# Patient Record
Sex: Female | Born: 1950 | Race: Black or African American | Hispanic: No | Marital: Single | Smoking: Former smoker
Health system: Southern US, Community
[De-identification: ages and names within clinical notes are randomized; demographics above are authoritative.]

## PROBLEM LIST (undated history)

## (undated) DIAGNOSIS — J449 Chronic obstructive pulmonary disease, unspecified: Secondary | ICD-10-CM

## (undated) DIAGNOSIS — M109 Gout, unspecified: Secondary | ICD-10-CM

## (undated) DIAGNOSIS — E789 Disorder of lipoprotein metabolism, unspecified: Secondary | ICD-10-CM

## (undated) DIAGNOSIS — M329 Systemic lupus erythematosus, unspecified: Secondary | ICD-10-CM

## (undated) DIAGNOSIS — F419 Anxiety disorder, unspecified: Secondary | ICD-10-CM

## (undated) DIAGNOSIS — E119 Type 2 diabetes mellitus without complications: Secondary | ICD-10-CM

## (undated) DIAGNOSIS — L659 Nonscarring hair loss, unspecified: Secondary | ICD-10-CM

## (undated) DIAGNOSIS — M1990 Unspecified osteoarthritis, unspecified site: Secondary | ICD-10-CM

## (undated) DIAGNOSIS — N189 Chronic kidney disease, unspecified: Secondary | ICD-10-CM

## (undated) DIAGNOSIS — Z6841 Body Mass Index (BMI) 40.0 and over, adult: Secondary | ICD-10-CM

## (undated) DIAGNOSIS — Z974 Presence of external hearing-aid: Secondary | ICD-10-CM

## (undated) DIAGNOSIS — I10 Essential (primary) hypertension: Secondary | ICD-10-CM

## (undated) DIAGNOSIS — K219 Gastro-esophageal reflux disease without esophagitis: Secondary | ICD-10-CM

## (undated) DIAGNOSIS — E213 Hyperparathyroidism, unspecified: Secondary | ICD-10-CM

## (undated) DIAGNOSIS — M545 Low back pain, unspecified: Secondary | ICD-10-CM

## (undated) HISTORY — DX: Body Mass Index (BMI) 40.0 and over, adult: Z684

## (undated) HISTORY — DX: Gout, unspecified: M10.9

## (undated) HISTORY — DX: Chronic obstructive pulmonary disease, unspecified: J44.9

## (undated) HISTORY — DX: Morbid (severe) obesity due to excess calories: E66.01

## (undated) HISTORY — DX: Gastro-esophageal reflux disease without esophagitis: K21.9

## (undated) HISTORY — DX: Unspecified osteoarthritis, unspecified site: M19.90

## (undated) HISTORY — DX: Chronic kidney disease, unspecified: N18.9

## (undated) HISTORY — DX: Disorder of lipoprotein metabolism, unspecified: E78.9

## (undated) HISTORY — DX: Low back pain: M54.5

## (undated) HISTORY — DX: Anxiety disorder, unspecified: F41.9

## (undated) HISTORY — DX: Essential (primary) hypertension: I10

## (undated) HISTORY — DX: Hyperparathyroidism, unspecified: E21.3

## (undated) HISTORY — DX: Systemic lupus erythematosus, unspecified: M32.9

## (undated) HISTORY — DX: Type 2 diabetes mellitus without complications: E11.9

## (undated) HISTORY — DX: Low back pain, unspecified: M54.50

## (undated) HISTORY — PX: OTHER SURGICAL HISTORY: SHX169

---

## 2003-05-17 ENCOUNTER — Ambulatory Visit (HOSPITAL_COMMUNITY): Admission: RE | Admit: 2003-05-17 | Discharge: 2003-05-17 | Payer: Self-pay | Admitting: Family Medicine

## 2003-06-13 ENCOUNTER — Encounter (HOSPITAL_COMMUNITY): Admission: RE | Admit: 2003-06-13 | Discharge: 2003-07-13 | Payer: Self-pay | Admitting: Orthopedic Surgery

## 2003-07-14 ENCOUNTER — Encounter (HOSPITAL_COMMUNITY): Admission: RE | Admit: 2003-07-14 | Discharge: 2003-08-13 | Payer: Self-pay | Admitting: Orthopedic Surgery

## 2003-09-18 ENCOUNTER — Emergency Department (HOSPITAL_COMMUNITY): Admission: EM | Admit: 2003-09-18 | Discharge: 2003-09-19 | Payer: Self-pay | Admitting: Emergency Medicine

## 2004-05-14 ENCOUNTER — Ambulatory Visit (HOSPITAL_COMMUNITY): Admission: RE | Admit: 2004-05-14 | Discharge: 2004-05-14 | Payer: Self-pay | Admitting: General Surgery

## 2004-07-19 ENCOUNTER — Ambulatory Visit (HOSPITAL_COMMUNITY): Admission: RE | Admit: 2004-07-19 | Discharge: 2004-07-19 | Payer: Self-pay | Admitting: Family Medicine

## 2004-11-18 ENCOUNTER — Ambulatory Visit (HOSPITAL_COMMUNITY): Admission: RE | Admit: 2004-11-18 | Discharge: 2004-11-18 | Payer: Self-pay | Admitting: Emergency Medicine

## 2005-12-04 ENCOUNTER — Ambulatory Visit (HOSPITAL_COMMUNITY): Admission: RE | Admit: 2005-12-04 | Discharge: 2005-12-04 | Payer: Self-pay | Admitting: Family Medicine

## 2006-12-29 ENCOUNTER — Emergency Department (HOSPITAL_COMMUNITY): Admission: EM | Admit: 2006-12-29 | Discharge: 2006-12-29 | Payer: Self-pay | Admitting: Emergency Medicine

## 2007-02-08 ENCOUNTER — Ambulatory Visit: Payer: Self-pay | Admitting: Orthopedic Surgery

## 2007-03-10 ENCOUNTER — Ambulatory Visit: Payer: Self-pay | Admitting: Orthopedic Surgery

## 2008-02-01 ENCOUNTER — Emergency Department (HOSPITAL_COMMUNITY): Admission: EM | Admit: 2008-02-01 | Discharge: 2008-02-01 | Payer: Self-pay | Admitting: Emergency Medicine

## 2008-08-31 ENCOUNTER — Emergency Department (HOSPITAL_COMMUNITY): Admission: EM | Admit: 2008-08-31 | Discharge: 2008-08-31 | Payer: Self-pay | Admitting: Emergency Medicine

## 2008-10-13 ENCOUNTER — Ambulatory Visit: Payer: Self-pay | Admitting: Internal Medicine

## 2008-10-13 DIAGNOSIS — I10 Essential (primary) hypertension: Secondary | ICD-10-CM | POA: Insufficient documentation

## 2008-10-13 DIAGNOSIS — M1990 Unspecified osteoarthritis, unspecified site: Secondary | ICD-10-CM | POA: Insufficient documentation

## 2008-10-13 DIAGNOSIS — M545 Low back pain: Secondary | ICD-10-CM

## 2008-10-13 LAB — CONVERTED CEMR LAB
Blood Glucose, Fingerstick: 101
Hgb A1c MFr Bld: 6.8 %

## 2008-10-16 ENCOUNTER — Ambulatory Visit (HOSPITAL_COMMUNITY): Admission: RE | Admit: 2008-10-16 | Discharge: 2008-10-16 | Payer: Self-pay | Admitting: Internal Medicine

## 2008-10-19 ENCOUNTER — Telehealth (INDEPENDENT_AMBULATORY_CARE_PROVIDER_SITE_OTHER): Payer: Self-pay | Admitting: *Deleted

## 2008-10-25 ENCOUNTER — Encounter (INDEPENDENT_AMBULATORY_CARE_PROVIDER_SITE_OTHER): Payer: Self-pay | Admitting: *Deleted

## 2008-10-25 LAB — CONVERTED CEMR LAB
ALT: 15 units/L (ref 0–35)
AST: 11 units/L (ref 0–37)
Albumin: 3.9 g/dL (ref 3.5–5.2)
Alkaline Phosphatase: 65 units/L (ref 39–117)
BUN: 16 mg/dL (ref 6–23)
Basophils Absolute: 0 10*3/uL (ref 0.0–0.1)
Basophils Relative: 0 % (ref 0–1)
CO2: 22 meq/L (ref 19–32)
Calcium: 9.1 mg/dL (ref 8.4–10.5)
Chloride: 108 meq/L (ref 96–112)
Cholesterol: 149 mg/dL (ref 0–200)
Creatinine, Ser: 0.97 mg/dL (ref 0.40–1.20)
Eosinophils Absolute: 0.1 10*3/uL (ref 0.0–0.7)
Eosinophils Relative: 1 % (ref 0–5)
Glucose, Bld: 90 mg/dL (ref 70–99)
HCT: 37.9 % (ref 36.0–46.0)
HDL: 47 mg/dL (ref >39)
Hemoglobin: 12.3 g/dL (ref 12.0–15.0)
LDL Cholesterol: 86 mg/dL (ref 0–99)
Lymphocytes Relative: 32 % (ref 12–46)
Lymphs Abs: 1.7 10*3/uL (ref 0.7–4.0)
MCHC: 32.5 g/dL (ref 30.0–36.0)
MCV: 88.8 fL (ref 78.0–100.0)
Monocytes Absolute: 0.5 10*3/uL (ref 0.1–1.0)
Monocytes Relative: 9 % (ref 3–12)
Neutro Abs: 3 10*3/uL (ref 1.7–7.7)
Neutrophils Relative %: 57 % (ref 43–77)
Platelets: 149 10*3/uL — ABNORMAL LOW (ref 150–400)
Potassium: 3.9 meq/L (ref 3.5–5.3)
RBC: 4.27 M/uL (ref 3.87–5.11)
RDW: 14.3 % (ref 11.5–15.5)
Sodium: 143 meq/L (ref 135–145)
Total Bilirubin: 0.6 mg/dL (ref 0.3–1.2)
Total CHOL/HDL Ratio: 3.2
Total Protein: 7.3 g/dL (ref 6.0–8.3)
Triglycerides: 80 mg/dL (ref <150)
VLDL: 16 mg/dL (ref 0–40)
WBC: 5.2 10*3/uL (ref 4.0–10.5)

## 2008-11-17 ENCOUNTER — Telehealth (INDEPENDENT_AMBULATORY_CARE_PROVIDER_SITE_OTHER): Payer: Self-pay | Admitting: *Deleted

## 2008-12-12 ENCOUNTER — Ambulatory Visit: Payer: Self-pay | Admitting: Internal Medicine

## 2008-12-12 ENCOUNTER — Encounter (INDEPENDENT_AMBULATORY_CARE_PROVIDER_SITE_OTHER): Payer: Self-pay | Admitting: Internal Medicine

## 2008-12-12 ENCOUNTER — Other Ambulatory Visit: Admission: RE | Admit: 2008-12-12 | Discharge: 2008-12-12 | Payer: Self-pay | Admitting: Internal Medicine

## 2008-12-18 ENCOUNTER — Ambulatory Visit (HOSPITAL_COMMUNITY): Admission: RE | Admit: 2008-12-18 | Discharge: 2008-12-18 | Payer: Self-pay | Admitting: Internal Medicine

## 2009-01-09 ENCOUNTER — Ambulatory Visit: Payer: Self-pay | Admitting: Internal Medicine

## 2009-01-09 DIAGNOSIS — E1149 Type 2 diabetes mellitus with other diabetic neurological complication: Secondary | ICD-10-CM | POA: Insufficient documentation

## 2009-01-09 LAB — CONVERTED CEMR LAB: Hgb A1c MFr Bld: 6.3 %

## 2009-02-20 ENCOUNTER — Ambulatory Visit: Payer: Self-pay | Admitting: Internal Medicine

## 2009-02-20 DIAGNOSIS — R9431 Abnormal electrocardiogram [ECG] [EKG]: Secondary | ICD-10-CM | POA: Insufficient documentation

## 2009-02-23 ENCOUNTER — Ambulatory Visit (HOSPITAL_COMMUNITY): Admission: RE | Admit: 2009-02-23 | Discharge: 2009-02-23 | Payer: Self-pay | Admitting: Internal Medicine

## 2009-02-23 ENCOUNTER — Ambulatory Visit: Payer: Self-pay | Admitting: Cardiology

## 2009-02-23 ENCOUNTER — Encounter (INDEPENDENT_AMBULATORY_CARE_PROVIDER_SITE_OTHER): Payer: Self-pay | Admitting: Internal Medicine

## 2009-04-04 ENCOUNTER — Ambulatory Visit: Payer: Self-pay | Admitting: Internal Medicine

## 2009-04-04 DIAGNOSIS — R109 Unspecified abdominal pain: Secondary | ICD-10-CM

## 2009-04-04 LAB — CONVERTED CEMR LAB
Blood Glucose, Fingerstick: 133
Hgb A1c MFr Bld: 6.5 %

## 2009-04-05 ENCOUNTER — Encounter (INDEPENDENT_AMBULATORY_CARE_PROVIDER_SITE_OTHER): Payer: Self-pay | Admitting: Internal Medicine

## 2009-04-05 LAB — CONVERTED CEMR LAB
BUN: 21 mg/dL (ref 6–23)
CO2: 22 meq/L (ref 19–32)
Calcium: 9.3 mg/dL (ref 8.4–10.5)
Chloride: 110 meq/L (ref 96–112)
Creatinine, Ser: 0.98 mg/dL (ref 0.40–1.20)
Glucose, Bld: 92 mg/dL (ref 70–99)
Potassium: 4.4 meq/L (ref 3.5–5.3)
Sodium: 144 meq/L (ref 135–145)

## 2009-07-05 ENCOUNTER — Ambulatory Visit: Payer: Self-pay | Admitting: Internal Medicine

## 2009-07-05 DIAGNOSIS — M79609 Pain in unspecified limb: Secondary | ICD-10-CM | POA: Insufficient documentation

## 2009-07-05 LAB — CONVERTED CEMR LAB
Blood Glucose, AC Bkfst: 6.4 mg/dL
Blood Glucose, Fingerstick: 161

## 2009-07-11 ENCOUNTER — Ambulatory Visit (HOSPITAL_COMMUNITY): Admission: RE | Admit: 2009-07-11 | Discharge: 2009-07-11 | Payer: Self-pay | Admitting: Internal Medicine

## 2009-07-11 ENCOUNTER — Encounter (INDEPENDENT_AMBULATORY_CARE_PROVIDER_SITE_OTHER): Payer: Self-pay | Admitting: Internal Medicine

## 2009-07-12 ENCOUNTER — Encounter (INDEPENDENT_AMBULATORY_CARE_PROVIDER_SITE_OTHER): Payer: Self-pay | Admitting: Internal Medicine

## 2009-07-31 ENCOUNTER — Encounter (INDEPENDENT_AMBULATORY_CARE_PROVIDER_SITE_OTHER): Payer: Self-pay | Admitting: Internal Medicine

## 2009-09-09 ENCOUNTER — Encounter: Admission: RE | Admit: 2009-09-09 | Discharge: 2009-09-09 | Payer: Self-pay | Admitting: Rheumatology

## 2009-12-20 ENCOUNTER — Ambulatory Visit (HOSPITAL_COMMUNITY): Admission: RE | Admit: 2009-12-20 | Discharge: 2009-12-20 | Payer: Self-pay | Admitting: Family Medicine

## 2010-03-05 ENCOUNTER — Encounter: Admission: RE | Admit: 2010-03-05 | Discharge: 2010-03-05 | Payer: Self-pay | Admitting: Family Medicine

## 2010-11-24 ENCOUNTER — Encounter: Payer: Self-pay | Admitting: General Surgery

## 2010-11-24 ENCOUNTER — Encounter: Payer: Self-pay | Admitting: Emergency Medicine

## 2011-01-07 ENCOUNTER — Other Ambulatory Visit (HOSPITAL_COMMUNITY): Payer: Self-pay | Admitting: Family Medicine

## 2011-01-07 DIAGNOSIS — Z139 Encounter for screening, unspecified: Secondary | ICD-10-CM

## 2011-01-09 ENCOUNTER — Ambulatory Visit (HOSPITAL_COMMUNITY)
Admission: RE | Admit: 2011-01-09 | Discharge: 2011-01-09 | Disposition: A | Discharge status: Home / Self Care | Payer: Medicare Other | Source: Ambulatory Visit | Attending: Family Medicine | Admitting: Family Medicine

## 2011-01-09 DIAGNOSIS — Z1231 Encounter for screening mammogram for malignant neoplasm of breast: Secondary | ICD-10-CM | POA: Insufficient documentation

## 2011-01-09 DIAGNOSIS — Z139 Encounter for screening, unspecified: Secondary | ICD-10-CM

## 2011-03-17 ENCOUNTER — Emergency Department (HOSPITAL_COMMUNITY)
Admission: EM | Admit: 2011-03-17 | Discharge: 2011-03-17 | Disposition: A | Discharge status: Home / Self Care | Payer: Medicare Other | Attending: Emergency Medicine | Admitting: Emergency Medicine

## 2011-03-17 ENCOUNTER — Emergency Department (HOSPITAL_COMMUNITY): Payer: Medicare Other

## 2011-03-17 DIAGNOSIS — M47817 Spondylosis without myelopathy or radiculopathy, lumbosacral region: Secondary | ICD-10-CM | POA: Insufficient documentation

## 2011-03-17 DIAGNOSIS — S8000XA Contusion of unspecified knee, initial encounter: Secondary | ICD-10-CM | POA: Insufficient documentation

## 2011-03-17 DIAGNOSIS — I708 Atherosclerosis of other arteries: Secondary | ICD-10-CM | POA: Insufficient documentation

## 2011-03-17 DIAGNOSIS — Z79899 Other long term (current) drug therapy: Secondary | ICD-10-CM | POA: Insufficient documentation

## 2011-03-17 DIAGNOSIS — M25569 Pain in unspecified knee: Secondary | ICD-10-CM | POA: Insufficient documentation

## 2011-03-17 DIAGNOSIS — E119 Type 2 diabetes mellitus without complications: Secondary | ICD-10-CM | POA: Insufficient documentation

## 2011-03-17 DIAGNOSIS — M545 Low back pain, unspecified: Secondary | ICD-10-CM | POA: Insufficient documentation

## 2011-03-17 DIAGNOSIS — M1710 Unilateral primary osteoarthritis, unspecified knee: Secondary | ICD-10-CM | POA: Insufficient documentation

## 2011-03-17 DIAGNOSIS — W1789XA Other fall from one level to another, initial encounter: Secondary | ICD-10-CM | POA: Insufficient documentation

## 2011-03-17 DIAGNOSIS — S301XXA Contusion of abdominal wall, initial encounter: Secondary | ICD-10-CM | POA: Insufficient documentation

## 2011-03-17 DIAGNOSIS — I10 Essential (primary) hypertension: Secondary | ICD-10-CM | POA: Insufficient documentation

## 2011-03-17 DIAGNOSIS — D259 Leiomyoma of uterus, unspecified: Secondary | ICD-10-CM | POA: Insufficient documentation

## 2011-03-17 DIAGNOSIS — G609 Hereditary and idiopathic neuropathy, unspecified: Secondary | ICD-10-CM | POA: Insufficient documentation

## 2011-03-17 DIAGNOSIS — K219 Gastro-esophageal reflux disease without esophagitis: Secondary | ICD-10-CM | POA: Insufficient documentation

## 2011-03-21 NOTE — Op Note (Signed)
Allison Gould, Allison Gould                             ACCOUNT NO.:  [id number]   MEDICAL RECORD NO.:  [id number]                   PATIENT TYPE:  AMB   LOCATION:  DAY                                  FACILITY:  [facility]   PHYSICIAN:  Felicie Morn, M.D.              DATE OF BIRTH:  [birth year]   DATE OF PROCEDURE:  DATE OF DISCHARGE:                                 OPERATIVE REPORT   PREOPERATIVE DIAGNOSIS:  Mass right upper arm, posterolateral aspect.   POSTOPERATIVE DIAGNOSIS:  Mass right upper arm, posterolateral aspect.   PROCEDURE:  1. Incisional biopsy with frozen section revealing a benign lipoma.  2. 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   SURGEON:  Felicie Morn, M.D.   NOTE:  This is a 60 year old black female [facility]  enlarging mass on her right upper arm.  MRI showed likely lipoma, but could  not rule out liposarcoma.  We planned for an incisional biopsy and then  later an excision of mass in the event that this should be a benign lipoma.   GROSS OPERATIVE FINDINGS:  A large benign appearing lipoma 15 x 13 cm in  diameter.  Frozen section rendered benign diagnosis.   SPECIMEN:  Mass right arm.   DESCRIPTION OF PROCEDURE:  The patient was placed in the supine position  after the adequate administration of general anesthesia by endotracheal  intubation her right arm was prepped with Betadine solution and draped in  the usual manner.  An incision was made longitudinally over the apex of the  palpable mass.   An incisional biopsy was carried out and sent as frozen, as discussed with  Dr. Mariea Clonts the pathologist, a benign diagnosis was then rendered; and then  an excision of the mass was carried out uneventfully.  This was removed en  toto.  It measured approximately 15 x 13 cm.  The wound was irrigated.  Bleeding was controlled with the cautery device, and since there was a large  cavity let, I decided to leave a  Jackson-Pratt drain in this area entering  through a separate stab wound incision.   The subcutaneous tissue was closed with 3-0 Polysorb and the skin was  approximated with 4-0 Nylon and 4-0 Prolene interrupted vertical mattress  sutures.  Neosporin and a sterile dressing was applied. The drain was  sutured in  place with 3-0 Nylon. Prior to closure all sponge, needle, and instrument  counts were found to be correct.  Estimated blood loss was minimal.  The  patient received 900 cc of crystalloids intraoperatively.  One drain was  placed.  There were no complications.      ___________________________________________  Felicie Morn, M.D.   WB/MEDQ  D:  05/14/2004  T:  05/14/2004  Job:  [id number]   cc:   Felicie Morn, M.D.  [address]  [place] [address]  Fax: [phone]   [facility]

## 2011-07-28 LAB — DIFFERENTIAL
Eosinophils Absolute: 0
Eosinophils Relative: 0
Lymphocytes Relative: 18
Lymphs Abs: 0.8
Monocytes Absolute: 0.3

## 2011-07-28 LAB — COMPREHENSIVE METABOLIC PANEL
ALT: 26
AST: 21
Albumin: 3.7
Alkaline Phosphatase: 76
BUN: 12
CO2: 27
Calcium: 9.5
Chloride: 108
Creatinine, Ser: 0.93
GFR calc Af Amer: >60
GFR calc non Af Amer: >60
Glucose, Bld: 127 — ABNORMAL HIGH
Potassium: 4.2
Sodium: 141
Total Bilirubin: 0.7
Total Protein: 7.7

## 2011-07-28 LAB — CBC
HCT: 38.6
Hemoglobin: 13.2
MCHC: 34.1
MCV: 89
Platelets: 144 — ABNORMAL LOW
RBC: 4.34
RDW: 15.3
WBC: 4.4

## 2011-08-04 LAB — DIFFERENTIAL
Basophils Absolute: 0
Basophils Relative: 0
Eosinophils Absolute: 0
Neutrophils Relative %: 58

## 2011-08-04 LAB — CBC
MCHC: 33.7
MCV: 88.4
Platelets: 151
RDW: 14.2

## 2011-08-04 LAB — POCT CARDIAC MARKERS
CKMB, poc: 1.1
Myoglobin, poc: 71.8

## 2011-08-04 LAB — BASIC METABOLIC PANEL
BUN: 15
CO2: 28
Chloride: 107
Creatinine, Ser: 1.06

## 2011-09-15 ENCOUNTER — Encounter (HOSPITAL_COMMUNITY): Payer: Self-pay | Admitting: Dietician

## 2011-09-15 NOTE — Progress Notes (Signed)
Outpatient Nutrition Follow-up Note Date: 09/15/11 Time: 9:00 AM Current weight: 303# BMI:52.01 Wt change: -2#(1%) x 1 month Allison Gould presents for follow-up appointment. Pt reports that she just returned from [place], [place], where she attended the [employer] with her church. Pt reports that despite being away, she ate well, due to the expensive costs of soda and candy. Reports blood sugars remain controlled (80-90's). Pt reports that she only drank 2 sodas and drank water and coffee for the remainder of the trip. Pt also reported doing a fair amount of walking, as her hotel was 2-3 blocks away from the convention center. However, pt did not engage in much exercise prior to the trip. Obesity continues, due to lack of physical activity. She reports going to the [facility] about once a week. She denies engaging in any other physical activity outside her daily routines. Pt reports that she has not been exercising much, due to her busy schedule and preparing for the trip. Educated pt on importance of physical activity to assist with weight loss. Discussed times that would work with her schedule to attend the [facility]. Pt has an appointment with Dr. Criss Rosales on 09/18/11. Goals: 1) 1-2# weight loss/ week; 2) 20 minutes of exercise 3-5 times per week. Expect fair compliance. F/U in 1 month.  [name], RD, LDN Date: 09/15/11 Time: 9:00 AM

## 2011-10-14 ENCOUNTER — Encounter (HOSPITAL_COMMUNITY): Payer: Self-pay | Admitting: Dietician

## 2011-10-14 NOTE — Progress Notes (Signed)
Outpatient Nutrition Counseling Follow-up Note Date: 10/14/11 Time: 9:00 AM Current weight: 300# BMI: 51.49 Weight change: -3# (1%) x 3 months  Allison Gould presents for follow-up. Praised for 5# weight loss (1.6%) in the past 2 months. She visited Dr. Criss Rosales last Thursday and reports that the visit went well; she reports that. Dr. Criss Rosales gave her a sample menu plan. She reports that she is to keep a 30 day food diary and return to Dr. Criss Rosales in 1 month. She reports that she has cut out bread and sweets in her diet. She reports that she has cut back on meat and has been eating a lot of beans and homemade soup. She reports that she has also cut back on crackers. Obesity continues, due to some inappropriate food choices and lack of physical activity. Diet recall: Breakfast: coffee with sugar, water, oatmeal; Morning Snack: Quaker oatmeal chips; Lunch: [employer] chicken, string beans, potato salad, mac and cheese, cake, sweet tea; Dinner: same as lunch minus the mac and cheese, regular drink.  She reports that she has been eating honey buns for snacks, but is interested in substituting fruit for snacks. Allison Gould has not been to the gym since last visit, but reports that she has been parking her car farther away from the store and walking around [place] while doing her shopping. She reports that she is active with her various commitments, but struggles with finding time to go to the gym. She also complains of right sided pain.  Discussed importance of limiting fried foods, limiting 1 starch per meal, and drinking only calorie free beverages. Encouraged food diary. Also discussed importance of trying to incorporate physical activity beyond normal daily chores. Discussed ways to fit exercise into daily schedule. Pt agreed to to to the gym 2-3x per week. Pt is also anxious about her upcoming holiday celebration at church; discussed looking around the buffet before getting a plate and making a healthy item, such as sugar-free  jello, to bring to the gathering.   Goals for next visit: 1) 1-2# weight loss/ week; 2) Calorie-free beverages only; 3) No fried foods; 4) Go to the gym 2-3x per week; 5) Fruit for snacks. Expect fair compliance. F/U in 1 month.  [name], RD, LDN Date: 10/14/11 Time: 9:00 AM

## 2011-11-18 ENCOUNTER — Encounter (HOSPITAL_COMMUNITY): Payer: Self-pay | Admitting: Dietician

## 2011-11-18 NOTE — Progress Notes (Signed)
Follow-Up Outpatient Nutrition Note Date: 11/18/2011  Time: 9:00 AM  Nutrition Assessment:  Current weight: 300# BMI: 51.49 Weight changes: 0#  Ms. Okon continues to struggle with weight loss. She reports that she gained 2# at her doctor's visit. She reports that Dr. Criss Rosales told her to lose 50# and feels discouraged.  She is continuing with the 30 day food diary. Per pt report, Dr. Criss Rosales told her not to eat cheeseburgers. She also reports her blood work was "in the red and yellow"; she reports that her Hgb A1c was 6.5 and her cholesterol was high. She reports she was put on aspirin. There is no recent blood work in [facility] and pt did not bring her lab sheet with her. Pt also did not bring her food diary with her. CBGs between 80-90's per pt report. Pt reports that she now looks at cholesterol and sodium on food labels. She has not been to the gym, but she reports that she does bends, squats, and crunches for 10-15 minutes 1-2 times daily.   Diet recall: Breakfast: coffee, water, oatmeal with sugar OR cheerios with banana and whole milk OR [place] hot dog; Lunch: soup and crackers; Dinner: soup ans crackers; HS snack: small piece of chocolate. She drinks mostly water.   Nutrition Diagnosis: Obesity r/t inappropriate food choices and lack of physical activity continues  Nutrition Intervention: Education/ counseling provided: Discussed importance of limiting fried and greasy foods. Discussed ways to trim calories on salads by limiting dressing/ choosing lite dressing or oil based dressing, eliminating croutons and bacon bits, and limiting processed meats. Suggested making homemade salad to decrease portion. Discussed sodium content in commonly eaten foods and various frozen dinners. Discussed portion control. Discussed importance of regular exercise. Discussed small, moderate weight loss.   Understanding/Motivation/ Ability to follow recommendations: Expect fair compliance.   Monitoring and  Evaluation: Previous goals: 1) 1-2# weight loss per week- goal not met 2) Calorie free beverages only- goal met; 3) No fried foods- goal not met; 4) Go to the gym 2-3x per week- goal not met ;5) Fruit for snacks- goal met.   Goals for next visit: 1) 1-2# weight loss per week; 2) Go to the gym 1-2 x per week; 3) 20-30 minutes physical activity daily  Recommendations: 1) Continue with food diary; 2) Try [employer] Lean Gourmet meals for easy lunch or dinner options (look for frozen meals below 600 mg of sodium per serving); 3) Slow, moderate weight loss  F/U: 1 month  Leita Lindbloom A. Kayan, RD, LDN Date:11/18/2011 Time: 9:00 AM

## 2011-12-17 ENCOUNTER — Encounter (HOSPITAL_COMMUNITY): Payer: Self-pay | Admitting: Dietician

## 2011-12-17 NOTE — Progress Notes (Signed)
Follow-Up Outpatient Nutrition Note Date: 12/16/2011 Time: 9:00 AM  Nutrition Assessment:  Current weight: 298# BMI: 51.15 Weight changes: -2# (0.7%) x 1 month  Allison Gould has lost 2# since last visit. This is the first time she has been under 200# since I started seeing her over a year ago. She reports that she saw Dr. Criss Rosales this month and lost 5# since her last visit with her 1 month ago. She reports "I'm changing the way I eat". She reports that she is now eating a lot of soup and Lean Cuisines. She is limiting bread and "starchy food". She reports that she is also exercising regularly. She reports she goes to the [facility] 3 times a week and spends 30 minutes on the treadmill; she reports that it is hard to her to do 30 minutes all at once. She also purchased some exercise bands to use at home and she continues to do bends, squats, and crunches for 10-15 minutes 1-2 times daily. She reports that she is still completing her food diary, but did not bring this with her.   Diet recall: Breakfast: coffee, water, oatmeal OR soup; Lunch: reports sometimes skips, but will eat lean cuisine if she eats lunch; Dinner: chicken and vegetables; HS snack: 1-2 servings of Weight Watchers dessert. She drinks mostly water.   Nutrition Diagnosis: Obesity r/t excessive energy intake and limited physical activity continues  Nutrition Intervention: Education/ counseling provided: Praised pt for weight loss and provided encouragement. Discussed portion control and importance of continuing to keep food diary. Discussed importance of regular exercise of at least 30 minutes 5 times per week. Discussed small, moderate weight loss of 1-2# per week.   Understanding/Motivation/ Ability to follow recommendations: Expect fair compliance.   Monitoring and Evaluation: Previous goals: 1) 1-2# weight loss per week- goal not met; 2) Go to the gym 1-2 x per week- goal met; 3) 20-30 minutes physical activity daily- goal met  Goals for  next visit: 1) 1-2# weight loss per week; 2) 30 minutes physical activity daily  Recommendations: 1) Continue with food diary; 2) Break up exercise into smaller, more frequent intervals  F/U: 1 month  Allison Gould, RD, LDN Date:12/16/11 Time: 9:00 AM

## 2012-01-13 ENCOUNTER — Encounter (HOSPITAL_COMMUNITY): Payer: Self-pay | Admitting: Dietician

## 2012-01-13 NOTE — Progress Notes (Signed)
Follow-Up Outpatient Nutrition Note Date: 3/12//2013 Time: 9:00 AM  Nutrition Assessment:  Current weight: 296# BMI: 50.81 Weight changes: -2# (0.7%) x 1 month  Allison Gould continues to make good progress. She has lost another 2# this month. She has an appointment with Dr. Criss Rosales next week. She is continuing to "do right". Her diet remains the same, but reports that she is consuming healthier snacks, such as fruit and Special K chips. She has been to the gym only one time this month. However, she reports that she walks almost daily at [place] or at church. She reports that she parks her car and escorts her nieces and nephews to school instead of dropping them off at the front of the school. She continues to do her leg exercises for 10-15 minutes daily. She is encouraged to lose weight when she watches "[employer]" TV show.   Diet recall: Breakfast: coffee, water, oatmeal OR soup; Lunch: Lean Cuisine; Snack: applesauce OR grapes OR banana; Dinner: broccoli with cheese, potatoes, juice, water; HS snack: blueberry low fat yogurt. She drinks mostly water.   Nutrition Diagnosis: Obesity r/t excessive energy intake and limited physical activity continues  Nutrition Intervention: Education/ counseling provided: Praised pt for weight loss and provided encouragement. Discussed portion control and importance of continuing to keep food diary. Discussed healthy snack ideas, such as fruit, unsweetened applesauce, or string cheese. Discussed importance of regular exercise of at least 30 minutes 5 times per week. Discussed small, moderate weight loss of 1-2# per week.   Understanding/Motivation/ Ability to follow recommendations: Expect fair compliance.   Monitoring and Evaluation: Previous goals: 1) 1-2# weight loss per week- goal not met; 2) Go to the gym 1-2 x per week- goal met; 3) 20-30 minutes physical activity daily- goal met  Goals for next visit: 1) 1-2# weight loss per week; 2) 30 minutes physical  activity daily  Recommendations: 1) Continue with food diary; 2) Break up exercise into smaller, more frequent intervals  F/U: 1 month  Allison Gould, RD, LDN Date:01/13/12 Time: 9:00 AM

## 2012-01-24 LAB — COMPREHENSIVE METABOLIC PANEL
ALT: 13 U/L (ref 7–35)
Albumin: 3.8
Alkaline Phosphatase: 90 U/L
Glucose: 72
Total Bilirubin: 0.5 mg/dL

## 2012-01-24 LAB — CBC WITH DIFFERENTIAL/PLATELET
HCT: 38 %
Hgb A1c MFr Bld: 6.3 % — AB (ref 4.0–6.0)

## 2012-02-12 ENCOUNTER — Telehealth (HOSPITAL_COMMUNITY): Payer: Self-pay | Admitting: Dietician

## 2012-02-12 NOTE — Telephone Encounter (Signed)
Sent appointment confirmation letter via [place] Mail to pt home. Appointment scheduled for 02/17/12 at 9:00 AM.

## 2012-02-17 ENCOUNTER — Encounter (HOSPITAL_COMMUNITY): Payer: Self-pay | Admitting: Dietician

## 2012-02-17 NOTE — Progress Notes (Signed)
Follow-Up Outpatient Nutrition Note Date: 02/17/2012 Time: 9:00 AM  Nutrition Assessment:  Current weight: 299# BMI: 51.32 Weight changes: +3# (1.0%) x 1 month  Allison Gould has goined 3# since last visit. She reports that this is due to her "not doing what I'm supposed to do". She says she is "drinking them sodas and eating too many sweets". She had an appointment with Dr. Criss Rosales last month and she reported that Dr. Criss Rosales was also disappointed in her progress, expecting her to lose more weight than she actually did. Her next appointment with Dr. Criss Rosales is in July.  She reports drinking tea with sugar; she reports she will sometimes put Splenda in it if she has it. She reports that she has had 2 sweet teas from [employer] and 2 sodas this week. She walked on the treadmill at the [facility] 2 times last week. She reports she struggles with fitting in exercise due to her family commitments. Discussed planning week out by calling family re: commitments and trying to go to the gym around the times where she does not have obligations. She reports she is trying to go to the gym 3 times a week and work up to 5 days a week. She reports she is baking her meats. She is consuming a lot of sweets and sugary beverages between meals.   Diet recall: Breakfast: coffee, water, oatmeal OR soup; Lunch: Lean Cuisine or [employer] Gourmet Meal; Snack: applesauce OR grapes OR banana; Dinner: string beans, corn, steak; HS snack: blueberry low fat yogurt. She drinks water, but also admits to drinking soda and sweet tea.   Nutrition Diagnosis: Obesity r/t excessive energy intake and limited physical activity continues  Nutrition Intervention: Education/ counseling provided: Discussed importance of physical activity to assist with weight loss. Discussed strategies to help fit exercise into her day, encouraged her to keep a schedule of her commitments to identify times where she could go to the gym. Also discussed calorie content of  sweets and sugary beverages. Discussed alternatives. Showed pt picture of [place] green diet, so she can look for it when she goes to the grocery store. Also provided Splenda sample.   Understanding/Motivation/ Ability to follow recommendations: Expect fair compliance.   Monitoring and Evaluation: Previous goals: 1) 1-2# weight loss per week- goal not met; 2) Go to the gym 1-2 x per week- goal met; 3) 20-30 minutes physical activity daily- goal not met  Goals for next visit: 1) 1-2# weight loss per week; 2) 30 minutes physical activity daily  Recommendations: 1) Continue with food diary; 2) Break up exercise into smaller, more frequent intervals; 3) Drink unsweetened beverages with artificial sweetener or diet soda/tea (ex. [place]); 4) Call once a week for family's schedule and obligations and schedule exercise around those times  F/U: 1 month  Allison Gould, RD, LDN Date:02/17/12 Time: 9:00 AM

## 2012-03-18 ENCOUNTER — Telehealth (HOSPITAL_COMMUNITY): Payer: Self-pay | Admitting: Dietician

## 2012-03-18 NOTE — Telephone Encounter (Signed)
Mailed appointment confirmation letter to pt home via [place] Mail for appointment scheduled for 03/23/12 at 9:00 AM.

## 2012-03-23 ENCOUNTER — Encounter (HOSPITAL_COMMUNITY): Payer: Self-pay | Admitting: Dietician

## 2012-03-23 NOTE — Progress Notes (Signed)
Follow-Up Outpatient Nutrition Note Date: 03/23/2012 Time: 9:00 AM  Nutrition Assessment:  Current weight: 297# BMI: 50.98 Weight changes: -2# (0.7%) x 1 month  Allison Gould has lost 2# since last visit. She has "good days and bad days". She reports she has been walking more. She has only been to the gym 3 times since last visit, but reports that she walks around the stores more frequently. She also parks her car farther away from the store and escorts her grandchildren into school instead of dropping them off at the front entrance. She reports to drinking a lot of sweet tea. She tries to order smaller portions when eating out and is ordering a salad instead of fries. However, she does admit to eating large portions at buffets. She is looking forward to her trip to [place] with her church members next week.   Diet recall: Breakfast: coffee, water, oatmeal OR soup OR 1/2 bagel; Lunch: Lean Cuisine or [employer] Gourmet Meal; Snack: applesauce OR grapes OR banana; Dinner: string beans, corn, chicken.t. She drinks water, but also admits to drinking soda and sweet tea.   Nutrition Diagnosis: Obesity r/t excessive energy intake and limited physical activity continues  Nutrition Intervention: Education/ counseling provided: Discussed importance of continued exercise to help achieve weight loss goals. Counseled against drinking soda and sugary beverages. Discussed alternatives to sugary beverages, such as sweetening drinks with Splenda and diet teas and diet sodas. Discussed recipe ideas and ways to prepare foods with less sugar and sodium.   Understanding/Motivation/ Ability to follow recommendations: Expect fair compliance.   Monitoring and Evaluation: Previous goals: 1) 1-2# weight loss per week- goal not met; 2) 30 minutes physical activity daily- goal not met  Goals for next visit: 1) 1-2# weight loss per week; 2) 30 minutes physical activity daily  Recommendations: 1) Break up exercise into  smaller, more frequent intervals; 2) Drink unsweetened beverages with artificial sweetener or diet soda/tea (ex. [place] diet Green Tea); 3) Continue to keep food diary  F/U: 1 month  [name], RD, LDN Date: 03/23/12 Time: 9:00 AM

## 2012-04-21 ENCOUNTER — Telehealth (HOSPITAL_COMMUNITY): Payer: Self-pay | Admitting: Dietician

## 2012-04-21 NOTE — Telephone Encounter (Signed)
Mailed appointment confirmation letter and instructions to pt home via [place] Mail.  

## 2012-04-27 ENCOUNTER — Encounter (HOSPITAL_COMMUNITY): Payer: Self-pay | Admitting: Dietician

## 2012-04-27 NOTE — Progress Notes (Signed)
Follow-Up Outpatient Nutrition Note Date: 04/27/2012 Time: 9:00 AM  Nutrition Assessment:  Current weight: 295# BMI: 50.64 Weight changes: -2# (0.7%) x 1 month  Allison Gould has lost 2# since last visit. She is surprised, especially since she reports she ate a lot during her trip to [place] at the beginning of the month. Pt reports she has eaten out several times and attended many cookouts and birthday parties. However, she reports "I've done a whole lot of walking". Pt reports she has not visited the gym since last visit, but walks around the mall 3 times per week. She has cut back on sweets and desserts, but continues to drink sweet tea and soda.  She reports continued good glycemic control with levels in the 80's.  Diet recall: Breakfast: coffee, water, oatmeall; Lunch: Lean Cuisine or [facility] or chili beans; Snack: potato chips; Dinner: baked chikcen, bowtie pasta, string beans OR steak, mixed vegetables, carrots, peaches. She drinks water, but also admits to drinking soda and sweet tea.   Nutrition Diagnosis: Obesity r/t excessive energy intake and limited physical activity continues  Nutrition Intervention: Education/ counseling provided: Discussed importance of continued exercise to help achieve weight loss goals. Counseled against drinking soda and sugary beverages. Discussed alternatives to sugary beverages, such as sweetening drinks with Splenda and diet teas and diet sodas. Provided encouragement.   Understanding/Motivation/ Ability to follow recommendations: Expect fair compliance.   Monitoring and Evaluation: Previous goals: 1) 1-2# weight loss per week- goal not met; 2) 30 minutes physical activity daily- progressing  Goals for next visit: 1) 1-2# weight loss per week; 2) 30 minutes physical activity 3 times per week  Recommendations: 1) Break up exercise into smaller, more frequent intervals; 2) Drink unsweetened beverages with artificial sweetener or diet  soda/tea (ex. [place] diet Green Tea); 3) Continue to keep food diary  F/U: 1 month. Follow-up appointment scheduled for Tuesday, 05/02/12 at 9:00 AM.   [name], RD, LDN Date: 04/27/12 Time: 9:00 AM

## 2012-05-20 ENCOUNTER — Telehealth (HOSPITAL_COMMUNITY): Payer: Self-pay | Admitting: Dietician

## 2012-05-20 NOTE — Telephone Encounter (Signed)
Mailed appointment confirmation letter and instructions for appointment scheduled 06/01/12 at 9:00 AM via [place] Mail.

## 2012-06-01 ENCOUNTER — Encounter (HOSPITAL_COMMUNITY): Payer: Self-pay | Admitting: Dietician

## 2012-06-01 NOTE — Progress Notes (Signed)
Follow-Up Outpatient Nutrition Note Date: 06/01/2012 Time: 9:00 AM  Nutrition Assessment:  Current weight: 294# BMI: 50.46 Weight changes: -1# (0%) x 1 month  Allison Gould has lost 1# since last visit. She reports that she is "working hard" with her weight loss efforts. She saw Dr. Criss Rosales last week and reported no complaints.  She reports being quite active, due to helping her niece move. Additionally, she recently had a church outreach function last weekend where she was very active, walking up and down stairs multiple times for a 5 hour duration. She is going to the gum 3 days per week; she rides the treadmill or bike for 30 minutes and spends 30-60 minutes on strength training. She reports that she her goal is to go to the gym 4 days per week. She reports following her diet is still a struggle when she is not at home, due to multiple church functions. She reports she is still trying to avoid bread. She reports continued good glycemic control with levels in the 80's and 90's.  Diet recall: Breakfast: coffee, water, banana, soup OR oatmeal OR cereal; Lunch: soup OR salad with light vinagrette dressing; Snack: 100 calorie pack snack; Dinner: baked bbq chicken without skin, sliced tomatoes, mixed vegetables. She drinks mostly water, juice, and tea with lemon.    Nutrition Diagnosis: Obesity r/t excessive energy intake and limited physical activity continues  Nutrition Intervention: Education/ counseling provided: Provided encouragement to continue with weight loss efforts. Encouraged more frequent sessions at gym. Encouraged low calorie beverages and to limit juice.  Understanding/Motivation/ Ability to follow recommendations: Expect fair compliance.   Monitoring and Evaluation: Previous goals: 1) 1-2# weight loss per week- progressing; 2) 30 minutes physical activity daily- progressing  Goals for next visit: 1) 1-2# weight loss per week; 2) 30 minutes physical activity 4 times per  week  Recommendations: 1) Break up exercise into smaller, more frequent intervals; 2) Dilute juice with water); 3) Continue to keep food diary  F/U: 4-6 weeks. Follow-up appointment scheduled for Tuesday, 07/06/12 at 9:00 AM.   [name], RD, LDN Date: 06/01/12 Time: 9:00 AM

## 2012-06-22 ENCOUNTER — Ambulatory Visit (INDEPENDENT_AMBULATORY_CARE_PROVIDER_SITE_OTHER): Payer: Medicare Other | Admitting: Gastroenterology

## 2012-06-22 ENCOUNTER — Encounter: Payer: Self-pay | Admitting: Gastroenterology

## 2012-06-22 VITALS — BP 117/58 | HR 74 | Temp 97.4°F | Ht 65.0 in | Wt 296.8 lb

## 2012-06-22 DIAGNOSIS — K5900 Constipation, unspecified: Secondary | ICD-10-CM | POA: Insufficient documentation

## 2012-06-22 DIAGNOSIS — Z1211 Encounter for screening for malignant neoplasm of colon: Secondary | ICD-10-CM

## 2012-06-22 MED ORDER — PEG 3350-KCL-NA BICARB-NACL 420 G PO SOLR: 4000.0000 L | ORAL | Status: AC

## 2012-06-22 MED ORDER — POLYETHYLENE GLYCOL 3350 17 GM/SCOOP PO POWD: 17.0000 g | Freq: Every day | ORAL | Status: AC

## 2012-06-22 NOTE — Progress Notes (Signed)
Primary Care Physician:  Elyn Peers, MD  Primary Gastroenterologist:  Barney Drain, MD   Chief Complaint  Patient presents with  . Colonoscopy  . Constipation    HPI:  Allison Gould is a 61 y.o. female here for consideration of colonoscopy. She has been have some "stomach issues" recently therefore brought in for OV. Overall feeling better at this point. She has had some occasional abdominal pain relieved with BM. C/O chronic constipation. BM every 2-3 days. Stools hard and has to strain. Takes Correctol or MOM every 1-2 weeks. No brbpr or melena. H/O GERD for few years. Heartburn controlled on Prilosec. No dysphagia.  No prior colonoscopy.   Current Outpatient Prescriptions  Medication Sig Dispense Refill  . acetaminophen (TYLENOL) 500 MG tablet Take 500 mg by mouth every 6 (six) hours as needed.      Marland Kitchen amLODipine (NORVASC) 10 MG tablet Take 10 mg by mouth daily.       Marland Kitchen aspirin 81 MG tablet Take 81 mg by mouth daily.      . cholecalciferol (VITAMIN D) 1000 UNITS tablet Take 1,000 Units by mouth daily.      . Cholecalciferol (VITAMIN D3) 1000 UNITS CAPS Take 1,000 Units by mouth daily.      . cyclobenzaprine (FLEXERIL) 10 MG tablet Take 10 mg by mouth 3 (three) times daily as needed.      . furosemide (LASIX) 40 MG tablet Take 40 mg by mouth daily.       Marland Kitchen gabapentin (NEURONTIN) 100 MG capsule Take 100 mg by mouth 3 (three) times daily. Take two by mouth TID      . lisinopril (PRINIVIL,ZESTRIL) 40 MG tablet Take 40 mg by mouth daily.       . metFORMIN (GLUCOPHAGE) 500 MG tablet Take 500 mg by mouth 2 (two) times daily with a meal.       . montelukast (SINGULAIR) 10 MG tablet Take 10 mg by mouth at bedtime.      Marland Kitchen omeprazole (PRILOSEC) 20 MG capsule Take 20 mg by mouth daily.       Marland Kitchen spironolactone (ALDACTONE) 25 MG tablet Take 25 mg by mouth 2 (two) times daily.         Allergies as of 06/22/2012  . (No Known Allergies)    Past Medical History  Diagnosis Date  . Diabetes  mellitus type II   . Hypertension   . Low back pain   . Osteoarthritis   . Anxiety   . Lipid disorder   . Morbid obesity with BMI of 45.0-49.9, adult   . GERD (gastroesophageal reflux disease)     Past Surgical History  Procedure Date  . Lipoma-right shoulder   . Spur and nerve repair right shoulder     Family History  Problem Relation Age of Onset  . Colon cancer Neg Hx   . Liver disease Neg Hx   . Inflammatory bowel disease Neg Hx   . Lupus Sister     History   Social History  . Marital Status: Single    Spouse Name: N/A    Number of Children: 1  . Years of Education: N/A   Occupational History  . unemployed    Social History Main Topics  . Smoking status: Former Smoker -- 0.5 packs/day    Types: Cigarettes  . Smokeless tobacco: Not on file   Comment: quit years ago  . Alcohol Use: No  . Drug Use: No  . Sexually Active: Not on file  Other Topics Concern  . Not on file   Social History Narrative  . No narrative on file      ROS:  General: Negative for anorexia, weight loss, fever, chills, fatigue, weakness. Eyes: Negative for vision changes.  ENT: Negative for hoarseness, difficulty swallowing , nasal congestion. CV: Negative for chest pain, angina, palpitations, dyspnea on exertion, peripheral edema.  Respiratory: Negative for dyspnea at rest, dyspnea on exertion, cough, sputum, wheezing.  GI: See history of present illness. GU:  Negative for dysuria, hematuria, urinary incontinence, urinary frequency, nocturnal urination.  MS: positive for joint pain, low back pain.  Derm: Negative for rash or itching.  Neuro: Negative for weakness, abnormal sensation, seizure, frequent headaches, memory loss, confusion.  Psych: Negative for anxiety, depression, suicidal ideation, hallucinations.  Endo: Negative for unusual weight change.  Heme: Negative for bruising or bleeding. Allergy: Negative for rash or hives.    Physical Examination:  BP 117/58   Pulse 74  Temp 97.4 F (36.3 C) (Temporal)  Ht 5\' 5"  (1.651 m)  Wt 296 lb 12.8 oz (134.628 kg)  BMI 49.39 kg/m2   General: Well-nourished, well-developed in no acute distress. obese Head: Normocephalic, atraumatic.   Eyes: Conjunctiva pink, no icterus. Mouth: Oropharyngeal mucosa moist and pink , no lesions erythema or exudate. Neck: Supple without thyromegaly, masses, or lymphadenopathy.  Lungs: Clear to auscultation bilaterally.  Heart: Regular rate and rhythm, no murmurs rubs or gallops.  Abdomen: Bowel sounds are normal, nontender, nondistended, no hepatosplenomegaly or masses, no abdominal bruits or    hernia , no rebound or guarding.   Rectal: defer Extremities: No lower extremity edema. No clubbing or deformities.  Neuro: Alert and oriented x 4 , grossly normal neurologically.  Skin: Warm and dry, no rash or jaundice.   Psych: Alert and cooperative, normal mood and affect.  Labs: Labs from 01/24/2012. Sodium 142, potassium 4.1, glucose 72, BUN 11, creatinine 1.2, total bilirubin 0.5, alkaline phosphatase 90, AST 16, ALT 13, albumin 3.8, calcium 9.4, white blood cell count 5600, hemoglobin 11.9, hematocrit 38, MCV 92, platelets 180,000, hemoglobin A1c 6.3.  Imaging Studies: No results found.

## 2012-06-22 NOTE — Assessment & Plan Note (Signed)
First ever colonoscopy in near future with Dr. Oneida Alar.  I have discussed the risks, alternatives, benefits with regards to but not limited to the risk of reaction to medication, bleeding, infection, perforation and the patient is agreeable to proceed. Written consent to be obtained.  Day of prep: 1/2 dose metformin.

## 2012-06-22 NOTE — Assessment & Plan Note (Signed)
Start Miralax 17g daily. May reduce to prn once bowel function improves. Increase dietary fiber, fluid intake.

## 2012-06-22 NOTE — Progress Notes (Signed)
Faxed to PCP

## 2012-06-22 NOTE — Patient Instructions (Signed)
We have scheduled you for a colonoscopy with Dr. Oneida Alar. See separate instructions.  Day you take the bowel prep, you need to decrease metformin to 250mg  twice a day with meal. You may resume normal dose after your colonoscopy.  Start Miralax 17 grams daily for constipation.

## 2012-06-25 ENCOUNTER — Encounter (HOSPITAL_COMMUNITY): Payer: Self-pay | Admitting: Pharmacy Technician

## 2012-07-01 ENCOUNTER — Telehealth (HOSPITAL_COMMUNITY): Payer: Self-pay | Admitting: Dietician

## 2012-07-01 NOTE — Telephone Encounter (Signed)
Mailed appointment confirmation letter and instructions for appointment scheduled 07/06/12 at 9:00 AM via [place] [facility].

## 2012-07-06 ENCOUNTER — Encounter (HOSPITAL_COMMUNITY): Payer: Self-pay | Admitting: Dietician

## 2012-07-06 NOTE — Progress Notes (Signed)
Follow-Up Outpatient Nutrition Note Date: 07/06/2012 Appt StartTime: 9:00 AM  Nutrition Assessment:  Current weight: 294# BMI: 50.46 Weight changes: 0# (0%) x 1 month  Allison Gould has maintained her weight. She reports that her weight continues to be in the 290's.  She reports that she has been "staying busy" with her various church and family commitments. Exercise has declined. She reports that she walks around [place] "a little bit", but complains that it is "too hot" to walk for long periods of time. She has not been to the gym recently. She reveals that she no longer has to watch her nieces and nephews in the morning, but now she has to watch them in the afternoons, which was when she usually goes to the gym. She complains that the gym is too busy early in the morning, but is slow around 11 AM.   She reports that she "sometimes" keeps her food diary if she doesn't forget.  She is also eating snacks between meals per the suggestion of Dr. Criss Rosales so she does not overeat at meals. She has been eating less sweets and bread per her report.   Diet recall: Breakfast: coffee, water, banana, soup OR oatmeal OR cereal;Snack: fruit, applesauce, bugles; Lunch: string beans, corn, cabbage, pinto beans, chicken, and water; Snack: fruit, applesauce, bugles; Dinner: taco bell wrap and ice drink. She drinks mostly water, juice, and tea with lemon.    Nutrition Diagnosis: Obesity r/t excessive energy intake and limited physical activity continues  Nutrition Intervention: Education/ counseling provided: Provided encouragement to continue with weight loss efforts. Encouraged more frequent sessions at gym and identifying a good time to attend the gym. Encouraged low calorie beverages and to limit juice. Encouraged pt to record food intake daily. Encouraged healthier, lower calorie snacks, such as fruit.  Understanding/Motivation/ Ability to follow recommendations: Expect fair compliance.   Monitoring and  Evaluation: Previous goals: 1) 1-2# weight loss per week- goal not met; 2) 30 minutes physical 4 times per week- progressing  Goals for next visit: 1) 1-2# weight loss per week; 2) 30 minutes physical activity 4 times per week;   Recommendations: 1) Break up exercise into smaller, more frequent intervals; 2) Dilute juice with water); 3) Continue to keep food diary; 4) Go to the gym in the late morning  F/U: 4-6 weeks. Follow-up appointment scheduled for Tuesday, 08/03/12 at 9:00 AM.   [name], RD, LDN Date: 07/06/12 Appt EndTime: 9:31 AM

## 2012-07-12 ENCOUNTER — Encounter (HOSPITAL_COMMUNITY): Payer: Self-pay | Admitting: *Deleted

## 2012-07-12 ENCOUNTER — Ambulatory Visit (HOSPITAL_COMMUNITY)
Admission: RE | Admit: 2012-07-12 | Discharge: 2012-07-12 | Disposition: A | Discharge status: Home / Self Care | Payer: Medicare Other | Source: Ambulatory Visit | Attending: Gastroenterology | Admitting: Gastroenterology

## 2012-07-12 ENCOUNTER — Encounter (HOSPITAL_COMMUNITY)
Admission: RE | Disposition: A | Discharge status: Home / Self Care | Payer: Self-pay | Source: Ambulatory Visit | Attending: Gastroenterology

## 2012-07-12 DIAGNOSIS — K648 Other hemorrhoids: Secondary | ICD-10-CM

## 2012-07-12 DIAGNOSIS — E119 Type 2 diabetes mellitus without complications: Secondary | ICD-10-CM | POA: Insufficient documentation

## 2012-07-12 DIAGNOSIS — Z01812 Encounter for preprocedural laboratory examination: Secondary | ICD-10-CM | POA: Insufficient documentation

## 2012-07-12 DIAGNOSIS — Z1211 Encounter for screening for malignant neoplasm of colon: Secondary | ICD-10-CM

## 2012-07-12 DIAGNOSIS — I10 Essential (primary) hypertension: Secondary | ICD-10-CM | POA: Insufficient documentation

## 2012-07-12 HISTORY — PX: COLONOSCOPY: SHX5424

## 2012-07-12 SURGERY — COLONOSCOPY
Anesthesia: Moderate Sedation

## 2012-07-12 MED ORDER — MIDAZOLAM HCL 5 MG/5ML IJ SOLN
INTRAMUSCULAR | Status: AC
  Filled 2012-07-12: qty 10

## 2012-07-12 MED ORDER — STERILE WATER FOR IRRIGATION IR SOLN
Status: DC | PRN
  Administered 2012-07-12: 13:00:00

## 2012-07-12 MED ORDER — MIDAZOLAM HCL 5 MG/5ML IJ SOLN
INTRAMUSCULAR | Status: DC | PRN
  Administered 2012-07-12 (×2): 2 mg via INTRAVENOUS

## 2012-07-12 MED ORDER — SODIUM CHLORIDE 0.45 % IV SOLN
INTRAVENOUS | Status: DC
  Administered 2012-07-12: 1000 mL via INTRAVENOUS

## 2012-07-12 MED ORDER — MEPERIDINE HCL 100 MG/ML IJ SOLN
INTRAMUSCULAR | Status: DC | PRN
  Administered 2012-07-12: 25 mg
  Administered 2012-07-12: 50 mg

## 2012-07-12 MED ORDER — MEPERIDINE HCL 100 MG/ML IJ SOLN
INTRAMUSCULAR | Status: AC
  Filled 2012-07-12: qty 1

## 2012-07-12 NOTE — H&P (Signed)
Primary Care Physician:  Elyn Peers, MD Primary Gastroenterologist:  Dr. Oneida Alar  Pre-Procedure History & Physical: HPI:  Allison Gould is a 61 y.o. female here for [place].   Past Medical History  Diagnosis Date  . Diabetes mellitus type II   . Hypertension   . Low back pain   . Osteoarthritis   . Anxiety   . Lipid disorder   . Morbid obesity with BMI of 45.0-49.9, adult   . GERD (gastroesophageal reflux disease)     Past Surgical History  Procedure Date  . Lipoma-right shoulder   . Spur and nerve repair right shoulder     Prior to Admission medications   Medication Sig Start Date End Date Taking? Authorizing Provider  acetaminophen (TYLENOL) 500 MG tablet Take 500 mg by mouth every 6 (six) hours as needed. Pain   Yes Historical Provider, MD  amLODipine (NORVASC) 10 MG tablet Take 10 mg by mouth daily.  06/05/12  Yes Historical Provider, MD  aspirin 81 MG tablet Take 81 mg by mouth daily.   Yes Historical Provider, MD  Carboxymethylcellul-Glycerin 0.5-0.9 % SOLN Place 1 drop into both eyes daily as needed. Dry Eyes   Yes Historical Provider, MD  cholecalciferol (VITAMIN D) 1000 UNITS tablet Take 1,000 Units by mouth daily.   Yes Historical Provider, MD  cyclobenzaprine (FLEXERIL) 10 MG tablet Take 10 mg by mouth 3 (three) times daily as needed. Muscle Spasms   Yes Historical Provider, MD  furosemide (LASIX) 40 MG tablet Take 40 mg by mouth daily.  05/07/12  Yes Historical Provider, MD  gabapentin (NEURONTIN) 100 MG capsule Take 100-200 mg by mouth 3 (three) times daily. Depends on pain rate   Yes Historical Provider, MD  lisinopril (PRINIVIL,ZESTRIL) 40 MG tablet Take 40 mg by mouth daily.  06/05/12  Yes Historical Provider, MD  metFORMIN (GLUCOPHAGE) 500 MG tablet Take 500 mg by mouth 2 (two) times daily with a meal.  05/07/12  Yes Historical Provider, MD  montelukast (SINGULAIR) 10 MG tablet Take 10 mg by mouth at bedtime.   Yes Historical Provider, MD  omeprazole  (PRILOSEC) 20 MG capsule Take 20 mg by mouth daily.  06/05/12  Yes Historical Provider, MD  spironolactone (ALDACTONE) 25 MG tablet Take 25 mg by mouth 2 (two) times daily.  05/07/12  Yes Historical Provider, MD  nitroGLYCERIN (NITROSTAT) 0.4 MG SL tablet Place 0.4 mg under the tongue every 5 (five) minutes as needed. Chest Pain    Historical Provider, MD    Allergies as of 06/22/2012  . (No Known Allergies)    Family History  Problem Relation Age of Onset  . Colon cancer Neg Hx   . Liver disease Neg Hx   . Inflammatory bowel disease Neg Hx   . Lupus Sister     History   Social History  . Marital Status: Single    Spouse Name: N/A    Number of Children: 1  . Years of Education: N/A   Occupational History  . unemployed    Social History Main Topics  . Smoking status: Former Smoker -- 0.5 packs/day    Types: Cigarettes  . Smokeless tobacco: Not on file   Comment: quit years ago  . Alcohol Use: No  . Drug Use: No  . Sexually Active: Not on file   Other Topics Concern  . Not on file   Social History Narrative  . No narrative on file    Review of Systems: See HPI, otherwise negative  ROS   Physical Exam: BP 139/56  Pulse 68  Temp 98 F (36.7 C) (Oral)  Resp 24  Ht 5\' 4"  (1.626 m)  Wt 294 lb (133.358 kg)  BMI 50.47 kg/m2  SpO2 100% General:   Alert,  pleasant and cooperative in NAD Head:  Normocephalic and atraumatic. Neck:  Supple; no masses or thyromegaly. LUNGS: CLEAE THRoughout to auscultation.    Heart:  Regular rate and rhythm. Abdomen:  Soft, nontender and nondistended. Normal bowel sounds, without guarding, and without rebound.   Neurologic:  Alert and  oriented x4;  grossly normal neurologically.  Impression/Plan:     SCREENING  Plan:  1. TCS TODAY

## 2012-07-12 NOTE — Op Note (Addendum)
[facility] [address] [place], [address]   COLONOSCOPY PROCEDURE REPORT  PATIENT: Allison Gould, Allison Gould  MR#: [id number] BIRTHDATE: [birth year] , 62  yrs. old GENDER: Female ENDOSCOPIST: Barney Drain, MD REFERRED [id number] Criss Rosales, M.D. PROCEDURE DATE:  07/12/2012 PROCEDURE:   Colonoscopy, screening INDICATIONS:average risk patient for colon cancer.  NAUSEA AND VOMTIING AFTER TAKING TRILYTE PREP MEDICATIONS: Demerol 50 mg IV and Versed 4 mg IV  DESCRIPTION OF PROCEDURE:    Physical exam was performed.  Informed consent was obtained from the patient after explaining the benefits, risks, and alternatives to procedure.  The patient was connected to monitor and placed in left lateral position. Continuous oxygen was provided by nasal cannula and IV medicine administered through an indwelling cannula.  After administration of sedation and rectal exam, the patients rectum was intubated and the [id number]  colonoscope was advanced under direct visualization to the cecum.  The scope was removed slowly by carefully examining the color, texture, anatomy, and integrity mucosa on the way out.  The patient was recovered in endoscopy and discharged home in satisfactory condition.       COLON FINDINGS: The colonic mucosa appeared normal. NO DIVERTICULA OR POLYPS. Large internal hemorrhoids were found.  PREP QUALITY: good. CECAL W/D TIME: 10 minutes  COMPLICATIONS: None  ENDOSCOPIC IMPRESSION: 1.   The colonic mucosa appeared normal 2.   Large internal hemorrhoids   RECOMMENDATIONS: 1.  await biopsy results 2.  High fiber diet 3.  repeat Colonscopy in 10 years [place]       _______________________________ Lorrin MaisBarney Drain, MD 07/12/2012 2:14 PM Revised: 07/12/2012 2:14 PM

## 2012-07-14 ENCOUNTER — Encounter (HOSPITAL_COMMUNITY): Payer: Self-pay | Admitting: Gastroenterology

## 2012-08-03 ENCOUNTER — Encounter (HOSPITAL_COMMUNITY): Payer: Self-pay | Admitting: Dietician

## 2012-08-03 ENCOUNTER — Telehealth (HOSPITAL_COMMUNITY): Payer: Self-pay | Admitting: Dietician

## 2012-08-03 NOTE — Telephone Encounter (Signed)
Opened in error

## 2012-08-03 NOTE — Progress Notes (Signed)
Follow-Up Outpatient Nutrition Note Date: 08/03/12 Appt StartTime: 0901  Nutrition Assessment:  Current weight: 293# BMI: 50.29 Weight changes: 1# (0.3%) x 1 month  Ms. Cloran is delighted with her weight loss. She hopes she can "lose more". She is feeling well today. She reports that her clothes are feeling looser; she is able to fit into some of her clothing that she wore back in 2008, when she was still working.  She is staying active with her church and family commitments. She is walking 30 minutes, twice a week around various stores around town, but admits she doesn't usually time herself. She has not been to the gym since last visit. She reports that she has been getting headaches, which she believes is related to an infection. She reports she had these types of headaches several years ago when she was seeing a dermatologist. She is eager to go back to the gym once her headaches have resolved.  She has "been trying hard" with her diet. She is incorporating more vegetables and fiber since she had her colonoscopy last month. She has started drinking 2% milk instead of whole milk. She has decreased desserts and is drinking more water. She is also eager to try new recipes from the diabetic cookbooks she recently received in the mail.   Diet recall: Breakfast: 2 pieces of whole wheat bread, oatmeal, coffee with splenda;Snack: applesauce; Lunch: soup and sandwich on whole wheat bread; Snack: pudding cup; Dinner: baked chicken, green beans, potatoes, carrots.     Nutrition Diagnosis: Obesity r/t excessive energy intake and limited physical activity continues  Nutrition Intervention: Education/ counseling provided: Provided encouragement to continue with weight loss efforts. Encouraged increased physical activity to optimize weight loss efforts. Discussed different recipe ideas to incorporate more vegetables and a wider variety of foods and food preparation methods. Discussed ideas for low calorie  beverages.   Understanding/Motivation/ Ability to follow recommendations: Expect fair compliance.   Monitoring and Evaluation: Previous goals: 1) 1-2# weight loss per week- progressing; 2) 30 minutes physical 4 times per week- progressing  Goals for next visit: 1) 1-2# weight loss per week; 2) 30 minutes physical activity 4 times per week;   Recommendations: 1) Choose water as beverage most often; 2) Time yourself when walking; 3) Continue to keep food diary; 4) Increase amount of walking days during the week if you do not go to the gym  F/U: 4-6 weeks. Follow-up appointment scheduled for Tuesday, 09/07/12 at 9:00 AM.   [name], RD, LDN Date: 08/03/12 Appt EndTime: [id number]

## 2012-08-15 NOTE — Progress Notes (Signed)
TCS SEP 2013 [facility]   REVIEWED.

## 2012-09-02 ENCOUNTER — Telehealth (HOSPITAL_COMMUNITY): Payer: Self-pay | Admitting: Dietician

## 2012-09-02 NOTE — Telephone Encounter (Signed)
Mailed appointment confirmation letter and instructions for appointment scheduled 09/07/12 via [place] [employer].

## 2012-09-07 ENCOUNTER — Encounter (HOSPITAL_COMMUNITY): Payer: Self-pay | Admitting: Dietician

## 2012-09-07 NOTE — Progress Notes (Signed)
Follow-Up Outpatient Nutrition Note Date: 09/07/12 Appt StartTime: [id number]  Nutrition Assessment:  Current weight: 298# BMI: 51.72 Weight changes: +5# (1.7%) x 1 month  Ms. Moshe progress has deteriorated. She reports she has been "trying not to eat, but I feel like I want to eat more; my stomach is never full". She reports increased stress when she has to pay her bills. CBGs remain in the 80's and reports they drop down in the 70's when she skips meals.  Her main complaint today is her back pain. 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 She reports this is the first time her back has hurt since then. She has not been evaluated by an MD re: back issues recently; she reports "thye didn't find anything" from the work up when she first was injured. She does not believe that her weight is contributing to her back pain. She reports that she is using a heating pad for her back. Due to her back pain she is "not walking like I should". Physical activity is limited to walking into stores. She also complains about being unable to use the gym as it is always busy when she goes.  She also admits to indulging in candy and soda. She has been eating out frequently and choosing foods such as cheeseburgers, fries, biscuits, and sweet tea. She reports she wants to try supplements advertised on the Dr. Irena Cords show, but laments that most of them are too expensive.   Diet recall: Breakfast: soup OR oatmeal and raisins OR toast, egg, bacon, juice, coffee with splenda; Snack: crackers OR applesauce OR cheese puffs; Lunch: soup; Snack: crackers OR applesauce OR cheese puffs; Dinner: chicken OR hot dogs OR beans. Beverages consist of mostly water, but also juice, sweet tea, and soda.   Nutrition Diagnosis: Obesity r/t excessive energy intake and limited physical activity continues  Nutrition Intervention: Education/ counseling provided: Encouraged pt to follow-up with PCP about back pain. Discussed  ways to incorporate physical activity into routine without aggravating back injury, such as breaking up activity into smaller, more frequent sessions. Also encouraged pt to get the most out of her gym membership and see if working with a trainer or doing water exercises is included in her membership. Discussed nutritional contents of foods commonly eaten and suggested healthier alternatives. Discussed healthier choices when eating out, such as choosing kids sized entrees and including only one starch per meal (ex. Eat either biscuit or fries). Also counseled against choosing high calorie beverages.Reviewed plate method. Had a long discussion with pt about making healthy lifestyle changes vs. "quick fix" weigh loss methods/ fad diets. Encouraged pt to make healthy changes that she can adapt long term to improve health and quality of life. Counseled against use of supplements, particularly since efficacy of claims and knowlwege is limited and the dangers of potential interactions with prescription medications.   Understanding/Motivation/ Ability to follow recommendations: Expect fair compliance.   Monitoring and Evaluation: Previous goals: 1) 1-2# weight loss per week- goal not met; 2) 30 minutes physical 4 times per week- goal not met  Goals for next visit: 1) 1-2# weight loss per week; 2) 30 minutes physical activity 4 times per week;   Recommendations: 1) Choose water as beverage most often; 2) Time yourself when walking; 3) Continue to keep food diary; 4) Increase amount of walking days during the week if you do not go to the gym; 5) Choose one starch per meal  F/U: 4-6  weeks. Follow-up appointment scheduled for Tuesday, 10/05/12 at 9:00 AM.   [name], RD, LDN Date: 09/07/2012 Appt EndTime: [id number]

## 2012-09-28 ENCOUNTER — Telehealth (HOSPITAL_COMMUNITY): Payer: Self-pay | Admitting: Dietician

## 2012-09-28 NOTE — Telephone Encounter (Signed)
Mailed appointment confirmation letter and instructions for appointment scheduled 10/05/12 at 9:00 AM via [place] [employer].

## 2012-10-05 ENCOUNTER — Encounter (HOSPITAL_COMMUNITY): Payer: Self-pay | Admitting: Dietician

## 2012-10-05 NOTE — Progress Notes (Signed)
Follow-Up Outpatient Nutrition Note Date: 10/05/12 Appt StartTime: 0901  Nutrition Assessment:  Current weight: 300# BMI: 53.14 Weight changes: +2# (0.6%) x 1 month  Unfortunately, Allison Gould progress continues to deteriorate. She reports that she has "been eating too much" and the holidays are tough for her to try and lose weight due to the various holiday parties and church functions.  She reports that her CBGs have "been low" lately, in the 70's. She also reports instances in the low 100's. Typical range between 80-90's.  She reports her back pain is improving. She has not been to the gym recently. She has been doing "some" walking, which she describes mostly for which does for typical errands around town.  Her main complain is her "stomach acting up". She reports that this has been going on since Friday and has been drinking a lot of clear sodas and soup.  She tells me she has been consuming a lot of Thanksgiving leftovers and fast food over the past few days.  She has not been keeping her food diary.   Diet recall: Breakfast: soup OR oatmeal, chicken noodle soup, grilled cheese, water tea, gingerale; Lunch: string beans, potatoes, dressing, candied yams, water, sprite; Dinner: soup  Nutrition Diagnosis: Obesity r/t excessive energy intake and limited physical activity continues  Nutrition Intervention: Education/ counseling provided: Discussed ways to try to maintain weight during holidays. Encouraged pt to maintain weight during the holidays, due to challenges with multiple parties and commitments. Discussed ideas to reduce calorie intake at parties, such as bringing a healthy side dish or dessert or to eat a small meal prior to attending in attempt to not consume as much. Also discussed walking around the buffet table before making selections  Encouraged pt to follow a healthy diet during the week.  Discussed importance of physical activity to assist with weight loss and maintainance. Had a  long discussion with pt about importance of doing physical activity outside of daily routine to better achieve goals. Also encouraged pt to get the most out of her gym membership and see if working with a trainer or doing water exercises is included in her membership. Discussed nutritional contents of foods commonly eaten and suggested healthier alternatives. Discussed healthier choices when eating out, such as choosing kids sized entrees and including only one starch per meal (ex. Eat either biscuit or fries). Also counseled against choosing high calorie beverages.   Understanding/Motivation/ Ability to follow recommendations: Expect fair compliance.   Monitoring and Evaluation: Previous goals: 1) 1-2# weight loss per week- goal not met; 2) 30 minutes physical 4 times per week- goal not met  Goals for next visit: 1) Weight maintainence; 2) 30 minutes physical activity 4 times per week;   Recommendations: 1) Choose water as beverage most often; 2) Time yourself when walking; 3) Continue to keep food diary; 4) Increase amount of walking days during the week if you do not go to the gym or play with nieces and nephews during the week; 5) Bring healthy dish to party; 6) Observe buffet table prior to choosing food selections or plan what to choose ahead of time, if menu is known in advance  F/U: 4-6 weeks. Follow-up appointment scheduled for Tuesday, 11/10/11 at 9:00 AM.   [name], RD, LDN Date: 10/05/2012 Appt EndTime: [id number]

## 2012-11-01 ENCOUNTER — Telehealth (HOSPITAL_COMMUNITY): Payer: Self-pay | Admitting: Dietician

## 2012-11-01 NOTE — Telephone Encounter (Signed)
Mailed appointment confirmation letter and instructions for appointment scheduled 11/09/12 at 9:00 AM via [place] [employer].

## 2012-11-09 ENCOUNTER — Encounter (HOSPITAL_COMMUNITY): Payer: Self-pay | Admitting: Dietician

## 2012-11-09 NOTE — Progress Notes (Signed)
Follow-Up Outpatient Nutrition Note Date: 10/05/12 Appt StartTime: 0900  Nutrition Assessment:  Current weight: 304# BMI: 53.85 Weight changes: +4# (1.3%) x 1 month  Unfortunately, Allison Gould progress continues to deteriorate. She reports that she has "been eating too much" due to holiday parties and church conventions. She reports that she "needs to start eating better".  She reports that she saw Dr. Criss Rosales within the last month and she had no complaints other than her weight. She has not gotten her labs results back yet. CBGS remain well controlled (70-100's).  She reports that she has been doing "some walking" but has not been going to the gym. She reports she mainly walks around stores while doing her shopping and parks farther away from the store. She reports "it is too cold to exercise". She also admits to skipping meals, often only eating breakfast and lunch. She will occasionally eat a snack on the run or may eat at a fast food establishment. She has been keeping nuts, fruit, and Fiber One Bars as snacks. She also admits to eating frozen yogurt at night.    Diet recall: Breakfast: raisins, oatmeal, coffee, water, juice; Lunch: tenderloin biscuit; Dinner: chicken, candied yams, dressing, greens, potato salad  Nutrition Diagnosis: Obesity r/t excessive energy intake and limited physical activity continues  Nutrition Intervention: Education/ counseling provided: Educated pt on importance of eating 3 meals per day, to maintain good glycemic control and to avoid increased snacking and food consumption during the day. Had long discussion with pt about how quick fix diets and skipping meals is not a solution for long term weight loss. Encouraged pt on slow, moderate weight loss and healthy lifestyle choices.  Reinforced importance of physical activity to assist with weight loss and maintenance; emphasized importance of doing physical activity outside of daily routine to better achieve goals. Also  encouraged pt to utilize her gym membership.  Discussed nutritional contents of foods commonly eaten and suggested healthier alternatives. Discussed healthy snack choices. Discussed healthier choices when eating out, such as choosing kids sized entrees and including only one starch per meal (ex. Eat either biscuit or fries). Also counseled against choosing high calorie beverages.   Understanding/Motivation/ Ability to follow recommendations: Expect fair compliance.   Monitoring and Evaluation: Previous goals: 1) Weight maintenance- goal not met; 2) 30 minutes physical activity 4 times per week-goal not met   Goals for next visit: 1) Weight maintainence; 2) 20 minutes physical activity 3 times per week   Recommendations: 1) Choose water as beverage most often; 2) Time yourself when walking; 3) Continue to keep food diary; 4) Increase amount of walking days during the week if you do not go to the gym or play with nieces and nephews during the week; 5) Limit purchases of snack foods  F/U: 4-6 weeks. Follow-up appointment scheduled for Tuesday, 12/15/11 at 9:00 AM.   Allison Gould, RD, LDN Date: 10/05/2012 Appt EndTime: [id number]

## 2012-12-14 ENCOUNTER — Telehealth (HOSPITAL_COMMUNITY): Payer: Self-pay | Admitting: Dietician

## 2012-12-14 NOTE — Telephone Encounter (Signed)
Received message at 821 which pt reports she was unable to make today's appointment at 9 AM. Called back at 1241. Appointment rescheduled for 12/20/12 at 9 AM.

## 2012-12-15 ENCOUNTER — Telehealth (HOSPITAL_COMMUNITY): Payer: Self-pay | Admitting: Dietician

## 2012-12-15 NOTE — Telephone Encounter (Signed)
Mailed appointment confirmation letter and instructions for appointment scheduled 12/20/12 at 9 AM via [place] [employer].

## 2012-12-20 ENCOUNTER — Encounter (HOSPITAL_COMMUNITY): Payer: Self-pay | Admitting: Dietician

## 2012-12-20 NOTE — Progress Notes (Signed)
Follow-Up Outpatient Nutrition Note Date: 12/21/11 Appt StartTime: 0902  Nutrition Assessment:  Current weight: 302# BMI: 53.50 Weight changes: -2# (0.7%) x 1 month  [name] has lost a minimal amount of weight since last visit. Her main complaint to day is not feeling well; she reports she has been congested and not sleeping well for the past week. She reports the weather and her sickness has presented her from exercising.  CBGS remain in the 80-100's. She reports it was "high once, but I know it was something I ate".  She reports eating a lot of soup and liquids due to her sickness. She also admits to snacking on crackers and potato chips. She reports that she recently bought lemon pepper and rosemary spices and she is looking forward to seasoning her foods with these spices.  She admits to occasionally indulging in high calorie beverages, such as soda, in fast food restaurants. She is trying to choose water most often. She also reports interest in returning to the gym 3 times per week once she feels better; she is looking to buy a new pair of sneakers because her current pair "make my feet wet".   Diet recall: Breakfast: oatmeal, water, coffee OR cheese and bologna sandwich and soup; Lunch: [employer] string beans, dirty rice, 2 piece chicken and biscuit OR string beans, corn, beets, and 2 [place] hot dogs; Dinner: pasta salad OR soup and crackers; Snack: chips or crackers.   Nutrition Diagnosis: Obesity r/t excessive energy intake and limited physical activity continues  Nutrition Intervention: Education/ counseling provided: Reinforced importance of physical activity to assist with weight loss and maintenance; emphasized importance of doing physical activity outside of daily routine to better achieve goals. Also encouraged pt to utilize her gym membership. Discussed importance of participating in physical activity outside of normal activities to assist with weight loss.  Discussed nutritional  contents of foods commonly eaten and suggested healthier alternatives. Discussed healthy snack choices. Discussed healthier choices when eating out, such as choosing kids sized entrees and including only one starch per meal (ex. Eat either biscuit or fries). Also counseled against choosing high calorie beverages. Discussed options for low calorie beverages, such as water, low calorie drink mixes, and water with fruit slices. Discussed ways to sweeten beverages without added sugar by using artificial sweetener.   Understanding/Motivation/ Ability to follow recommendations: Expect fair compliance.   Monitoring and Evaluation: Previous goals: 1) Weight maintenance- goal not met; 2) 30 minutes physical activity 4 times per week-goal not met   Goals for next visit: 1) Weight maintainence; 2) 20 minutes physical activity 3 times per week   Recommendations: 1) Choose water as beverage most often; 2) Time yourself when walking; 3) Continue to keep food diary; 4) Increase amount of walking days during the week if you do not go to the gym or play with nieces and nephews during the week; 5) Limit purchases of snack foods  F/U: 4-6 weeks. Follow-up appointment scheduled for Tuesday, 01/26/12 at 9:00 AM.   [name], RD, LDN Date: 12/20/2012 Appt EndTime: [id number]

## 2013-01-25 ENCOUNTER — Encounter (HOSPITAL_COMMUNITY): Payer: Self-pay | Admitting: Dietician

## 2013-01-25 NOTE — Progress Notes (Addendum)
Follow-Up Outpatient Nutrition Note Date: 01/26/12 Appt StartTime: 0903  Nutrition Assessment:  Current weight: 303# BMI: 53.67 Weight changes: +1# (0%) x 1 month  Ms. Cashmore has gained weight from last visit. She reports that it has been difficult to lose weight due to the various functions at her church. The second and fourth Sundays are most difficult for her, as those are the weekends her church hosts dinner. She does not feel comfortable declining food at church; "my mother always told me it was rude to not eat the food at church". She also admits to snacking in front of the television at night. She reports she has a new blender and wants to try making smoothies. She is thinking about including this in addition to her other breakfast foods.  She reports that she continues to walk, but exercise is very minimal. She reports she parks her car farther away from the store and she will occasionally walk around the mall. She reports she has gone to the gym, but cannot remember the last time she went. She suggests it was more than 2-3 weeks ago. She complains about pain in her right ankle and left leg. She reports this is what keeps her from walking as much as she would like. She reflected on how she has lost weight in the past by walking.  She reports she is not consistently keeping her food diary.  Diabetes remains well controlled. CBGS between 80-100's, per pt report.   Diet recall: Breakfast: oatmeal, water, coffee soup, juice; Lunch: [employer] string beans, dirty rice, 2 piece chicken and biscuit OR string beans, corn, beets, and 2 [place] hot dogs; Dinner: baked chicken, string beans or corn; Snack: chips OR crackers OR fruit.   Nutrition Diagnosis: Obesity r/t excessive energy intake and limited physical activity continues  Nutrition Intervention: Education/ counseling provided: Reinforced importance of physical activity to assist with weight loss and maintenance; emphasized importance of doing  physical activity outside of daily routine to better achieve goals. Also encouraged pt to utilize her gym membership. Had long conversation about importance of participating in physical activity outside of normal activities to assist with weight loss. Discussed ways to better utilize her time and gym membership, such as going in the late mornings, when she is not as busy.  Also discussed specific examples of ways to improve her diet. Discussed nutritional content in smoothies and shared examples of ways they could be prepared to incorporate as part of breakfast meal, but emphasized importance of portion control. Suggested to measure out portions of ingredients used and consider eliminating one of her breakfast items if she includes a smoothie. Also discussed incorporating smoothies into snacks. Discussed healthier alternatives to snacks as well. Reviewed nutritional content of current snacks. Teach back method used.    Understanding/Motivation/ Ability to follow recommendations: Expect fair compliance.   Monitoring and Evaluation: Previous goals: 1) Weight maintenance- goal not met; 2) 30 minutes physical activity 3 times per week-goal not met   Goals for next visit: 1) 1-2# weight loss per week; 2) 20 minutes physical activity 3 times per week   Recommendations: 1) Choose water as beverage most often; 2) Keep food diary; 3) Do not snack or eat in front of the TV; 4) Choose fruits or vegetables as snacks; 5) Go to [facility] in late morning or walk on days you do not go to [facility] (schedule exercise commitment on your calendar)   F/U: 4-6 weeks. Follow-up appointment scheduled for Tuesday, 03/01/12 at 9:00 AM.  [name], RD, LDN Date: 01/25/2013 Appt EndTime: [phone]

## 2013-02-13 ENCOUNTER — Emergency Department (HOSPITAL_COMMUNITY): Payer: Medicare Other

## 2013-02-13 ENCOUNTER — Emergency Department (HOSPITAL_COMMUNITY)
Admission: EM | Admit: 2013-02-13 | Discharge: 2013-02-13 | Disposition: A | Discharge status: Home / Self Care | Payer: Medicare Other | Attending: Emergency Medicine | Admitting: Emergency Medicine

## 2013-02-13 ENCOUNTER — Encounter (HOSPITAL_COMMUNITY): Payer: Self-pay | Admitting: *Deleted

## 2013-02-13 DIAGNOSIS — Z8639 Personal history of other endocrine, nutritional and metabolic disease: Secondary | ICD-10-CM | POA: Insufficient documentation

## 2013-02-13 DIAGNOSIS — M25511 Pain in right shoulder: Secondary | ICD-10-CM

## 2013-02-13 DIAGNOSIS — Z87891 Personal history of nicotine dependence: Secondary | ICD-10-CM | POA: Insufficient documentation

## 2013-02-13 DIAGNOSIS — W19XXXA Unspecified fall, initial encounter: Secondary | ICD-10-CM

## 2013-02-13 DIAGNOSIS — S46909A Unspecified injury of unspecified muscle, fascia and tendon at shoulder and upper arm level, unspecified arm, initial encounter: Secondary | ICD-10-CM | POA: Insufficient documentation

## 2013-02-13 DIAGNOSIS — K219 Gastro-esophageal reflux disease without esophagitis: Secondary | ICD-10-CM | POA: Insufficient documentation

## 2013-02-13 DIAGNOSIS — I10 Essential (primary) hypertension: Secondary | ICD-10-CM | POA: Insufficient documentation

## 2013-02-13 DIAGNOSIS — S4980XA Other specified injuries of shoulder and upper arm, unspecified arm, initial encounter: Secondary | ICD-10-CM | POA: Insufficient documentation

## 2013-02-13 DIAGNOSIS — S93409A Sprain of unspecified ligament of unspecified ankle, initial encounter: Secondary | ICD-10-CM | POA: Insufficient documentation

## 2013-02-13 DIAGNOSIS — F411 Generalized anxiety disorder: Secondary | ICD-10-CM | POA: Insufficient documentation

## 2013-02-13 DIAGNOSIS — Y9229 Other specified public building as the place of occurrence of the external cause: Secondary | ICD-10-CM | POA: Insufficient documentation

## 2013-02-13 DIAGNOSIS — Z862 Personal history of diseases of the blood and blood-forming organs and certain disorders involving the immune mechanism: Secondary | ICD-10-CM | POA: Insufficient documentation

## 2013-02-13 DIAGNOSIS — S93421A Sprain of deltoid ligament of right ankle, initial encounter: Secondary | ICD-10-CM

## 2013-02-13 DIAGNOSIS — Y9389 Activity, other specified: Secondary | ICD-10-CM | POA: Insufficient documentation

## 2013-02-13 DIAGNOSIS — Z8739 Personal history of other diseases of the musculoskeletal system and connective tissue: Secondary | ICD-10-CM | POA: Insufficient documentation

## 2013-02-13 DIAGNOSIS — W07XXXA Fall from chair, initial encounter: Secondary | ICD-10-CM | POA: Insufficient documentation

## 2013-02-13 DIAGNOSIS — Z79899 Other long term (current) drug therapy: Secondary | ICD-10-CM | POA: Insufficient documentation

## 2013-02-13 DIAGNOSIS — E119 Type 2 diabetes mellitus without complications: Secondary | ICD-10-CM | POA: Insufficient documentation

## 2013-02-13 DIAGNOSIS — Z6841 Body Mass Index (BMI) 40.0 and over, adult: Secondary | ICD-10-CM | POA: Insufficient documentation

## 2013-02-13 MED ORDER — HYDROCODONE-ACETAMINOPHEN 5-325 MG PO TABS: ORAL_TABLET | ORAL | Status: DC

## 2013-02-13 MED ORDER — OXYCODONE-ACETAMINOPHEN 5-325 MG PO TABS
1.0000 | ORAL_TABLET | Freq: Once | ORAL | Status: AC
  Administered 2013-02-13: 1 via ORAL
  Filled 2013-02-13: qty 1

## 2013-02-13 NOTE — ED Notes (Signed)
Pt states she fell out of chair at church at ~1100. States pain to right ankle (which was already hurting but hurting worse after the fall) and pain to right lower arm.

## 2013-02-13 NOTE — ED Notes (Signed)
Pt with right ankle pain prior to fall today, since fall ankle pain is worse, pt has had brace to right ankle PTA, also c/o right shoulder pain, mid back pain, denies hitting her head

## 2013-02-13 NOTE — ED Provider Notes (Signed)
History     CSN: [id number]  Arrival date & time 02/13/13  23   First MD Initiated Contact with Patient 02/13/13 1604      Chief Complaint  Patient presents with  . Ankle Pain  . Arm Pain    (Consider location/radiation/quality/duration/timing/severity/associated sxs/prior treatment) Patient is a 62 y.o. female presenting with ankle pain. The history is provided by the patient.  Ankle Pain Location:  Ankle (right shoulder) Time since incident:  4 hours Injury: yes   Mechanism of injury: fall   Mechanism of injury comment:  Patient states she feel from a chair. landed on her right ankle and right shoulder Fall:    Impact surface:  Hard floor   Point of impact: right shoulder and right ankle.   Entrapped after fall: no   Ankle location:  R ankle Pain details:    Quality:  Aching and throbbing   Radiates to:  Does not radiate   Severity:  Moderate   Onset quality:  Sudden   Duration:  4 hours   Timing:  Constant   Progression:  Unchanged Chronicity:  New Dislocation: no   Foreign body present:  No foreign bodies Prior injury to area: prior surgery to the right shoulder. Relieved by:  Nothing Worsened by:  Activity, rotation and bearing weight Associated symptoms: decreased ROM and swelling   Associated symptoms: no back pain, no fever, no itching, no neck pain, no numbness, no stiffness and no tingling     Past Medical History  Diagnosis Date  . Diabetes mellitus type II   . Hypertension   . Low back pain   . Osteoarthritis   . Anxiety   . Lipid disorder   . Morbid obesity with BMI of 45.0-49.9, adult   . GERD (gastroesophageal reflux disease)     Past Surgical History  Procedure Laterality Date  . Lipoma-right shoulder    . Spur and nerve repair right shoulder    . Colonoscopy  07/12/2012    Procedure: COLONOSCOPY;  Surgeon: Danie Binder, MD;  Location: [facility];  Service: Endoscopy;  Laterality: N/A;  1:00    Family History  Problem Relation  Age of Onset  . Colon cancer Neg Hx   . Liver disease Neg Hx   . Inflammatory bowel disease Neg Hx   . Lupus Sister     History  Substance Use Topics  . Smoking status: Former Smoker -- 0.50 packs/day    Types: Cigarettes  . Smokeless tobacco: Not on file     Comment: quit years ago  . Alcohol Use: No    OB History   Grav Para Term Preterm Abortions TAB SAB Ect Mult Living                  Review of Systems  Constitutional: Negative for fever and chills.  HENT: Negative for facial swelling and neck pain.   Respiratory: Negative for shortness of breath.   Cardiovascular: Negative for chest pain.  Gastrointestinal: Negative for vomiting.  Genitourinary: Negative for dysuria and difficulty urinating.  Musculoskeletal: Positive for joint swelling and arthralgias. Negative for back pain and stiffness.  Skin: Negative for color change, itching and wound.  Neurological: Negative for dizziness, syncope, facial asymmetry, weakness and numbness.  All other systems reviewed and are negative.    Allergies  Review of patient's allergies indicates no known allergies.  Home Medications   Current Outpatient Rx  Name  Route  Sig  Dispense  Refill  .  amLODipine (NORVASC) 10 MG tablet   Oral   Take 10 mg by mouth daily.          . cholecalciferol (VITAMIN D) 1000 UNITS tablet   Oral   Take 1,000 Units by mouth daily.         . cyclobenzaprine (FLEXERIL) 10 MG tablet   Oral   Take 10 mg by mouth 3 (three) times daily as needed. Muscle Spasms         . furosemide (LASIX) 40 MG tablet   Oral   Take 40 mg by mouth daily.          Marland Kitchen gabapentin (NEURONTIN) 100 MG capsule   Oral   Take 100-200 mg by mouth 3 (three) times daily. Depends on pain rate         . lisinopril (PRINIVIL,ZESTRIL) 40 MG tablet   Oral   Take 40 mg by mouth daily.          . metFORMIN (GLUCOPHAGE) 500 MG tablet   Oral   Take 500 mg by mouth 2 (two) times daily with a meal.          .  omeprazole (PRILOSEC) 20 MG capsule   Oral   Take 20 mg by mouth daily.          Marland Kitchen spironolactone (ALDACTONE) 25 MG tablet   Oral   Take 25 mg by mouth 2 (two) times daily.          Marland Kitchen acetaminophen (TYLENOL) 500 MG tablet   Oral   Take 1,000 mg by mouth every 6 (six) hours as needed for pain. Pain         . Carboxymethylcellul-Glycerin 0.5-0.9 % SOLN   Both Eyes   Place 1 drop into both eyes daily as needed. Dry Eyes         . nitroGLYCERIN (NITROSTAT) 0.4 MG SL tablet   Sublingual   Place 0.4 mg under the tongue every 5 (five) minutes as needed. Chest Pain           BP 133/51  Pulse 71  Temp(Src) 98.3 F (36.8 C) (Oral)  Resp 20  Ht 5\' 5"  (1.651 m)  Wt 308 lb (139.708 kg)  BMI 51.25 kg/m2  SpO2 100%  Physical Exam  Nursing note and vitals reviewed. Constitutional: She is oriented to person, place, and time. She appears well-developed and well-nourished. No distress.  HENT:  Head: Normocephalic and atraumatic.  Cardiovascular: Normal rate, regular rhythm, normal heart sounds and intact distal pulses.   Pulmonary/Chest: Effort normal and breath sounds normal.  Musculoskeletal: She exhibits tenderness.  Right lateral ankle is ttp, mild STS is present.  ROM is preserved.  DP pulse is brisk, distal sensation intact.  No erythema, abrasion, bruising or bony deformity.  Pt also has ttp of the right posterior shoulder.  Pain reproduced with rotation and palpation of the joint, distal sensation intact,  Grip strength is strong and equal.  No spinal  tenderness  Neurological: She is alert and oriented to person, place, and time. She exhibits normal muscle tone. Coordination normal.  Skin: Skin is warm and dry.    ED Course  Procedures (including critical care time)  Labs Reviewed  GLUCOSE, CAPILLARY - Abnormal; Notable for the following:    Glucose-Capillary 127 (*)    All other components within normal limits   Dg Shoulder Right  02/13/2013  *RADIOLOGY REPORT*   Clinical Data: Right arm pain  RIGHT SHOULDER - 2+ VIEW  Comparison: None.  Findings: No fracture is seen.  Widening of the acromioclavicular joint.  Coracoclavicular distance is normal.  Tendon anchors in the proximal humerus.  Visualized right lung is clear.  IMPRESSION: No fracture is seen.  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   Original Report Authenticated By: Julian Hy, M.D.    Dg Ankle Complete Right  02/13/2013  *RADIOLOGY REPORT*  Clinical Data: Fall.  Right ankle pain.  RIGHT ANKLE - COMPLETE 3+ VIEW  Comparison: None.  Findings: No acute bony or joint abnormality is identified. Calcaneal spurring is noted.  Tiny well corticated fragment off the medial malleolus may be due to old trauma.  Soft tissue structures are unremarkable.  IMPRESSION: No acute finding.   Original Report Authenticated By: Orlean Patten, M.D.      Aso splint applied and sling applied to right shoulder.  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  Pt agrees to close f/u with Dr. Aline Brochure.    Will prescribe norco #20  The patient appears reasonably screened and/or stabilized for discharge and I doubt any other medical condition or other [facility] requiring further screening, evaluation, or treatment in the ED at this time prior to discharge.    Vinicius Brockman L. [place], PA-C 02/14/13 2238

## 2013-02-15 NOTE — ED Provider Notes (Signed)
Medical screening examination/treatment/procedure(s) were performed by non-physician practitioner and as supervising physician I was immediately available for consultation/collaboration.   Ezequiel Essex, MD 02/15/13 0700

## 2013-03-01 ENCOUNTER — Encounter (HOSPITAL_COMMUNITY): Payer: Self-pay | Admitting: Dietician

## 2013-03-01 NOTE — Progress Notes (Signed)
Follow-Up Outpatient Nutrition Note Date: 03/01/2013 Appt StartTime: 0900  Nutrition Assessment:  Current weight: Weight: 304 lb (137.893 kg)  Body mass index is 53.86 kg/(m^2). Weight changes: +1# (0%) x 1 month  Allison Gould has gained weight from last visit. She reports that she is "trying to do better". She reports that she saw Dr. Criss Rosales last month and is awaiting the results of her lab work. She reports Dr. Criss Rosales is most concerned about her weight- she reports she was 308# at her last visit.  She reports she sprained her ankle and hurst her shoulder on 02/13/13. She is wearing an ankle brace due to a sprain and will see Dr. Aline Brochure tomorrow. She reports it is difficult for her to be active due to the ankle sprain, but prior to that she was trying to walk 10 minutes daily. She has not been to the gym.  She reports to drinking sweetened beverages, such as soda, lemonade, and sweet tea. She also reports she eats out about 1-2 x per month, but suspect there may be some underreporting, as she reported multiple restuarants ([employer], [employer], and [employer]) that she has been to since the previous visit.   Diet recall: Breakfast: oatmeal with fruit, coffee, water; Lunch: soup and sandwich OR soup, piece of cake, and water OR chick fil a combo; Dinner: hot dog, homemade chili with cheese; Snack: pack of nabs.   Nutrition Diagnosis: Obesity r/t excessive energy intake and limited physical activity continues  Nutrition Intervention: Education/ counseling provided: Reinforced importance of physical activity to assist with weight loss and maintenance; emphasized importance of doing physical activity outside of daily routine to better achieve goals. Emphasized importance of resuming physical activity, once she receives medical clearance from Dr. Aline Brochure.  Also discussed specific examples of ways to improve her diet. Had long discussion with patient about limiting high calorie beverages in her diet.  Suggested lower calorie beverage alternatives, such as water, crystal light, flavored water, or water with fruit slices. Showed pt example of a fruit diffuser bottle that is available at [facility], if she desired to try. Also discussed implications of frequent fast food consumption. Discussed healthier options and choices at restaurants, such as savng half portion of food for a later meal, choosing a side salad or vegetables instead of fries, and drinking water with meals instead of tea, lemonade, and soda.Teach back method used.    Understanding/Motivation/ Ability to follow recommendations: Expect fair compliance.   Monitoring and Evaluation: Previous goals: 1) 1-2# wt loss per week- goal not met; 2) 20 minutes physical activity 3 x per week- progressing  Goals for next visit: 1) 1-2# weight loss per week; 2) 10 minutes physical activity 5 times per week   Recommendations: 1) Choose water as beverage most often; 2) Keep food diary; 3) Do not snack or eat in front of the TV; 4) Choose fruits or vegetables as snacks; 5) Go to [facility] in late morning or walk on days you do not go to  Hospital (schedule exercise commitment on your calendar)   F/U: 4-6 weeks. Follow-up appointment scheduled for Tuesday, 03/01/12 at 9:00 AM.   [name], RD, LDN Date: 03/01/2013 Appt EndTime: [id number]

## 2013-03-02 ENCOUNTER — Encounter: Payer: Self-pay | Admitting: Orthopedic Surgery

## 2013-03-02 ENCOUNTER — Ambulatory Visit (INDEPENDENT_AMBULATORY_CARE_PROVIDER_SITE_OTHER): Payer: Medicare Other | Admitting: Orthopedic Surgery

## 2013-03-02 VITALS — BP 110/70 | Ht 63.0 in | Wt 304.0 lb

## 2013-03-02 DIAGNOSIS — S93401A Sprain of unspecified ligament of right ankle, initial encounter: Secondary | ICD-10-CM

## 2013-03-02 DIAGNOSIS — S93409A Sprain of unspecified ligament of unspecified ankle, initial encounter: Secondary | ICD-10-CM

## 2013-03-02 DIAGNOSIS — S40011A Contusion of right shoulder, initial encounter: Secondary | ICD-10-CM

## 2013-03-02 DIAGNOSIS — S40019A Contusion of unspecified shoulder, initial encounter: Secondary | ICD-10-CM

## 2013-03-02 NOTE — Progress Notes (Signed)
  Subjective:    Patient ID: Allison Gould, female    DOB: [birth year], 62 y.o.   MRN: [id number]  HPI Comments: The patient had a right rotator cuff repair and distal clavicle excision in [facility] approximately 2 years ago she doesn't remember the doctor  Her x-ray says she has an a.c. separation type II and what she really has is a distal clavicle excision  She does have some difficulty raising her arm with some weakness when she's trying to lift anything heavy  Ankle Injury  Incident onset: 02/04/2013. The incident occurred at home. The injury mechanism was a fall. The pain is present in the right ankle (Right shoulder). The quality of the pain is described as stabbing. The pain is at a severity of 8/10. The pain has been constant since onset. Associated symptoms include a loss of motion and muscle weakness. Pertinent negatives include no inability to bear weight, loss of sensation, numbness or tingling. Associated symptoms comments: Pain in the evening and in the morning.      Review of Systems  Constitutional: Positive for unexpected weight change.  HENT: Negative.   Eyes: Negative.   Respiratory: Positive for wheezing.   Cardiovascular: Negative.   Gastrointestinal: Positive for constipation.  Endocrine: Negative.   Genitourinary: Negative.   Musculoskeletal: Positive for joint swelling and arthralgias.  Allergic/Immunologic: Positive for environmental allergies.  Neurological: Negative.  Negative for tingling and numbness.  Hematological: Bruises/bleeds easily.  Psychiatric/Behavioral: Negative.        Objective:   Physical Exam BP 110/70  Ht 5\' 3"  (1.6 m)  Wt 304 lb (137.893 kg)  BMI 53.86 kg/m2  Vital signs are stable as recorded  General appearance is normal  The patient is alert and oriented x3  The patient's mood and affect are normal  Gait assessment: She does have a disturbance of ambulation favoring the right lower extremity  The cardiovascular  exam reveals normal pulses and temperature without edema or  swelling.    The sensory exam is normal.  There are no pathologic reflexes.  Balance is normal.   Exam of the right shoulder and right ankle Inspection right shoulder for elevation 120, normal strength to manual muscle testing no instability skin is normal no tenderness, transverse scar including from resection of distal clavicle and rotator cuff repair  Right ankle flatfoot deformity chronic, range of motion deficits seen chronic as well. No instability. Weakness mild fevers. Skin intact.     Assessment & Plan:   Right ankle sprain Right shoulder contusion  Recommend physical therapy for 6 weeks and recheck

## 2013-03-02 NOTE — Patient Instructions (Signed)
The patient will start physical therapy continue hydrocodone for pain follow up in 6 weeks

## 2013-03-11 ENCOUNTER — Ambulatory Visit (HOSPITAL_COMMUNITY)
Admission: RE | Admit: 2013-03-11 | Discharge: 2013-03-11 | Disposition: A | Discharge status: Home / Self Care | Payer: Medicare Other | Source: Ambulatory Visit | Attending: Orthopedic Surgery | Admitting: Orthopedic Surgery

## 2013-03-11 DIAGNOSIS — M25619 Stiffness of unspecified shoulder, not elsewhere classified: Secondary | ICD-10-CM | POA: Insufficient documentation

## 2013-03-11 DIAGNOSIS — M25519 Pain in unspecified shoulder: Secondary | ICD-10-CM | POA: Insufficient documentation

## 2013-03-11 DIAGNOSIS — M6281 Muscle weakness (generalized): Secondary | ICD-10-CM | POA: Insufficient documentation

## 2013-03-11 DIAGNOSIS — IMO0001 Reserved for inherently not codable concepts without codable children: Secondary | ICD-10-CM | POA: Insufficient documentation

## 2013-03-11 DIAGNOSIS — R269 Unspecified abnormalities of gait and mobility: Secondary | ICD-10-CM | POA: Insufficient documentation

## 2013-03-11 DIAGNOSIS — S93401A Sprain of unspecified ligament of right ankle, initial encounter: Secondary | ICD-10-CM

## 2013-03-11 DIAGNOSIS — M25579 Pain in unspecified ankle and joints of unspecified foot: Secondary | ICD-10-CM | POA: Insufficient documentation

## 2013-03-11 NOTE — Evaluation (Signed)
Physical Therapy Evaluation  Patient Details  Name: Allison Gould MRN: [id number] Date of Birth: Mar 26, 1951  Today's Date: 03/11/2013 Time: 0930-1015 PT Time Calculation (min): 45 min Charges: 1 eval, 10 TE             Visit#: 1 of 8  Re-eval: 04/10/13 Assessment Diagnosis: Rt ankle sprain Surgical Date: 02/06/13 Next MD Visit: Dr. Aline Brochure - June 12  Authorization: [facility] Medicare    Authorization Time Period:    Authorization Visit#:  1 of  10  Past Medical History:  Past Medical History  Diagnosis Date  . Diabetes mellitus type II   . Hypertension   . Low back pain   . Osteoarthritis   . Anxiety   . Lipid disorder   . Morbid obesity with BMI of 45.0-49.9, adult   . GERD (gastroesophageal reflux disease)    Past Surgical History:  Past Surgical History  Procedure Laterality Date  . Lipoma-right shoulder    . Spur and nerve repair right shoulder    . Colonoscopy  07/12/2012    Procedure: COLONOSCOPY;  Surgeon: Danie Binder, MD;  [facility];  Service: Endoscopy;  Laterality: N/A;  1:00    Subjective Symptoms/Limitations Pertinent History: Pt is referred to PT for Rt ankle fracture from a fall at Sunday school on 02/06/13.  She went to the ER and was given a brace, pain medication and instructed to use ice on her rt ankl;e and rt shoulder.  She went 2 days previously to states that she had pain to the back of her ankle which was related to her weight. C/co for her ankle: pain at night, pain with walking, difficulty donning brace, swelling Patient Stated Goals: Pt wishes to be able to attend her church convention in [place] on May 23rd without as much pain.  Pain Assessment Currently in Pain?: Yes Pain Score:   3 Pain Location: Shoulder Pain Orientation: Right Pain Type: Acute pain Pain Radiating Towards: Desciptors: "feels tight" Pain Onset: 1 to 4 weeks ago Pain Frequency: Intermittent Pain Relieving Factors: pain medication and ice Effect of Pain on Daily  Activities: difficulty walking for extended time, shopping, has an upcoming church convention in [place] (May 23rd).  Multiple Pain Sites: Yes  Precautions/Restrictions  Precautions Precautions: None Restrictions Weight Bearing Restrictions: No  Balance Screening Balance Screen Has the patient fallen in the past 6 months: Yes How many times?: 2 Has the patient had a decrease in activity level because of a fear of falling? : Yes Is the patient reluctant to leave their home because of a fear of falling? 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 Arousal/Alertness: Awake/alert Orientation Level: Oriented X4 Observation/Other Assessments Observations: swelling to her Rt ankle  Assessment RLE AROM (degrees) Right Ankle Dorsiflexion: -5 Right Ankle Plantar Flexion: 40 Right Ankle Inversion: 30 Right Ankle Eversion: 10 RLE Strength Right Hip Flexion: 3+/5 Right Hip Extension: 3+/5 Right Hip ABduction: 3+/5 Right Hip ADduction: 2+/5 Right Knee Flexion: 4/5 Right Knee Extension: 4/5 Right Ankle Dorsiflexion: 4/5 Right Ankle Plantar Flexion: 2+/5 Right Ankle Inversion: 3/5 Right Ankle Eversion: 2+/5 Palpation Palpation: pain and tenderness to rt lateral ankle   Mobility/Balance  Ambulation/Gait Ambulation/Gait: Yes Assistive device: None Gait Pattern: Antalgic;Right foot  flat;Left foot flat Static Standing Balance Single Leg Stance - Right Leg: 0 Single Leg Stance - Left Leg: 7 Tandem Stance - Right Leg: 10 Tandem Stance - Left Leg: 25 Rhomberg - Eyes Opened: 30 Rhomberg - Eyes Closed: 30    Exercise/Treatments Supine Straight Leg Raises: Right;5 reps Other Supine  Knee Exercises: isometric hip adduction 5x10 sec holds Sidelying Hip ABduction: Right;5 reps Ankle Exercises - Seated Ankle Circles/Pumps: AROM;Right;10 reps;Limitations Ankle Circles/Pumps Limitations: Legs elevated Ankle Exercises - Supine Other Supine Ankle Exercises: Inversion/Eversion 5 reps  Physical Therapy Assessment and Plan PT Assessment and Plan Clinical Impression Statement: Pt is a 62 year old female referred to PT s/p fall with Rt ankle sprain with following impairments below.  Pt will benefit from skilled therapeutic intervention in order to improve on the following deficits: Abnormal gait;Decreased balance;Difficulty walking;Decreased strength;Pain;Impaired perceived functional ability;Decreased range of motion;Decreased activity tolerance PT Frequency: Min 2X/week PT Duration: 4 weeks PT Treatment/Interventions: Gait training;Stair training;Functional mobility training;Therapeutic activities;Therapeutic exercise;Balance training;Neuromuscular re-education;Patient/family education;Manual techniques;Modalities PT Plan: Continue with BLE strengthening ( NuStep, Bike, and progress to TM) and ankle/foot strengthening.  Add gastroc and solues stretching, marbles and towel activities    Goals Home Exercise Program Pt will Perform Home Exercise Program: Independently PT Goal: Perform Home Exercise Program - Progress: Goal set today PT Short Term Goals Time to Complete Short Term Goals: 2 weeks PT Short Term Goal 1: Pt will improve her pain to less than 3/10 during the night time hours.   PT Short Term Goal 2: Pt will improve Rt LE strength to WNL in order to ascend and descend 5 steps with 1 handrail with reciprocal pattern in order to enter community dwellings.  PT Short Term Goal 3: Pt wil improved activitty tolerance and walk on the treadmill x15 minutes in order to prepare for her upcoming trip to [place].  PT Long Term Goals Time to Complete Long Term Goals: 4 weeks PT  Long Term Goal 1: Pt will improve her ankle AROM to [facility] in order to have greater ease with ascending and descending steps.  PT Long Term Goal 2: Pt will improve her dynamic balance in order to walk on the treadmill with unilateral UE support in order to continue with TM walking at the gym to progress towards and HEP.   Problem List Patient Active Problem List   Diagnosis Date Noted  . Pain in joint, shoulder region 03/11/2013  . Muscle weakness (generalized) 03/11/2013  . Pain in joint, ankle and foot 03/11/2013  . Abnormality of gait 03/11/2013  . Right ankle sprain 03/02/2013  . Shoulder contusion 03/02/2013  . Constipation 06/22/2012  . Colon cancer screening 06/22/2012  . Pain in soft tissues of limb 07/05/2009  . FLANK PAIN, RIGHT 04/04/2009  . ELECTROCARDIOGRAM, ABNORMAL 02/20/2009  . DIABETES MELLITUS, WITH NEUROLOGICAL COMPLICATIONS [id number]  . HYPERTENSION 10/13/2008  . OSTEOARTHRITIS 10/13/2008  . LOW BACK PAIN 10/13/2008    PT Plan of Care PT Home Exercise Plan: see scanned repot PT Patient Instructions: edema management, brace weaning and answered questions about diagnosis. Consulted and Agree with Plan of Care: Patient  GP Functional Assessment Tool Used: clnical judgement Functional Limitation: Mobility: Walking and moving around Mobility: Walking and Moving Around Current Status [id number]): At least 40 percent but less than 60 percent impaired, limited or restricted Mobility: Walking and Moving Around Goal Status [id number]): At least 1 percent but less than 20 percent impaired, limited or restricted  Allison Gould, MPT, ATC 03/11/2013, 3:47 PM  Physician Documentation Your signature is required to indicate approval of the treatment plan as stated above.  Please sign and either send electronically or make a copy of this report for your files and return this physician signed original.   Please mark one 1.__approve of plan  2. ___approve of plan with the following  conditions.   ______________________________                                                          _____________________ Physician Signature                                                                                                             Date

## 2013-03-11 NOTE — Evaluation (Signed)
Occupational Therapy Evaluation  Patient Details  Name: Allison Gould MRN: [id number] Date of Birth: 05-22-1951  Today's Date: 03/11/2013 Time: 1020-1105 OT Time Calculation (min): 45 min OT eval 1020-1105 45'  Visit#: 1 of 12  Re-eval: 04/08/13  Assessment Diagnosis: Right shoulder contusion Prior Therapy: for Right rotator cuff sx  Authorization: [facility] Medicare  Authorization Time Period: before 10th visit  Authorization Visit#: 1 of 10   Past Medical History:  Past Medical History  Diagnosis Date  . Diabetes mellitus type II   . Hypertension   . Low back pain   . Osteoarthritis   . Anxiety   . Lipid disorder   . Morbid obesity with BMI of 45.0-49.9, adult   . GERD (gastroesophageal reflux disease)    Past Surgical History:  Past Surgical History  Procedure Laterality Date  . Lipoma-right shoulder    . Spur and nerve repair right shoulder    . Colonoscopy  07/12/2012    Procedure: COLONOSCOPY;  Surgeon: Danie Binder, MD;  [facility]: [facility];  Service: Endoscopy;  Laterality: N/A;  1:00    Subjective Symptoms/Limitations Symptoms: S: It's hard for me to do things because my shoulder gets really sore and I get tired out and have to rest.  Pertinent History: In April 2014, Nashay fell at church when her chair moved from [place] her causing her a right shoulder contusion and right ankle sprain which is causing her difficulty when completing daily activities. Dr. Aline Brochure has referred Barbaraann Share for occupational therapy evaluation and treatment.   Limitations: Lifting heavy items, reaching above head, increased pain, difficulty completing housework/dishes, shoulder fatigues quickly. Patient Stated Goals: To decrease pain and increase strength and mobility. Pain Assessment Currently in Pain?: Yes Pain Score:   3 Pain Location: Shoulder Pain Orientation: Right Pain Type: Acute pain Pain Radiating Towards: Desciptors: "feels tight" Pain Onset: 1 to 4 weeks ago Pain  Frequency: Intermittent Pain Relieving Factors: pain medication and ice Effect of Pain on Daily Activities: difficulty walking for extended time, shopping, has an upcoming church convention in [place] (May 23rd).  Multiple Pain Sites: Yes  Precautions/Restrictions  Precautions Precautions: None Restrictions Weight Bearing Restrictions: No  Balance Screening Balance Screen Has the patient fallen in the past 6 months: Yes How many times?: 2 Has the patient had a decrease in activity level because of a fear of falling? : Yes Is the patient reluctant to leave their home because of a fear of falling? 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 Rt shoulder fatigues quickly causing her to take several rest breaks.  Dominant Hand: Right Vision - History Baseline Vision: Wears glasses all the time  Cognition/Observation Cognition Overall Cognitive Status: Within Functional Limits for tasks assessed Arousal/Alertness: Awake/alert Orientation Level: Oriented X4 Observation/Other Assessments Observations: swelling to her Rt ankle  Sensation/Coordination/Edema Coordination Gross Motor Movements are Fluid and Coordinated: Yes Fine Motor Movements are Fluid and Coordinated: Yes  Additional Assessments RUE Assessment RUE Assessment:  (assessed supine. IR/ER ADD) RUE AROM (degrees) Right Shoulder Flexion: 125  Degrees Right Shoulder ABduction: 100  Degrees Right Shoulder Internal Rotation: 90 Degrees Right Shoulder External Rotation: 64 Degrees RUE PROM (degrees) Right Shoulder Flexion: 155 Degrees Right Shoulder ABduction: 105 Degrees Right Shoulder Internal Rotation: 90 Degrees Right Shoulder External Rotation: 64 Degrees RUE Strength Right Shoulder Flexion:  (4-/5) Right Shoulder ABduction:  (4-/5) Right Shoulder Internal Rotation:  (4-/5) Right Shoulder External Rotation:  (4-/5) LUE Assessment LUE Assessment:  (assessed supine IR/ER ADD) LUE AROM (degrees) Left Shoulder Flexion: 160 Degrees Left Shoulder ABduction: 117 Degrees Left Shoulder Internal Rotation: 90 Degrees Left Shoulder External Rotation: 65 Degrees LUE Strength Left Shoulder Flexion: 5/5 Left Shoulder ABduction: 5/5 Left Shoulder Internal Rotation: 5/5 Left Shoulder External Rotation: 5/5 Palpation Palpation: Mod fascial restriction to upper deltoid, trapezius, and scapularis region      Occupational Therapy Assessment and Plan OT Assessment and Plan Clinical Impression Statement: A; Patient is a 62 y/o female s/p right shoulder contusion presenting to outpatient OT with increase shoulder pain, decreased strength and joint mobility, and difficulty completing daily and leisure activities.   Pt will benefit from skilled therapeutic intervention in order to improve on the following deficits: Increased fascial restricitons;Decreased activity tolerance;Decreased range of motion;Decreased strength;Pain Rehab Potential: Excellent OT Frequency: Min 2X/week OT Duration: 6 weeks OT Treatment/Interventions: Self-care/ADL training;Therapeutic activities;Therapeutic exercise;Manual therapy;Modalities;Patient/family education OT Plan: P: Skilled OT intervention is indicated to increase pain free mobility and strength and decrease pain and fascial restricitons in her right shoulder in order to complete all desired acitivities with her right arm. Treatment Plan: MFR  and manual stretching to right shoulder and associated areas. AAROM progressing to AROM in supine and seated. Scapular stability and strengtheing activities. shoulder stretches to decrease rotator cuff tightness    Goals Short Term Goals Time to Complete Short Term Goals: 3 weeks Short Term Goal 1: Patient will be educated on HEP. Short Term Goal 2: Patient will increase PROM to WNL to increase ability to donn shirt without fatigue and increased time.  Short Term Goal 3: Patient will decrease pain level in Right shoulder to 1/10 when performing functional tasks. Short Term Goal 4: Patient will increase right shoulder strengthe to 4+/5 to increase ability to lift dishes into cabient. Short Term Goal 5: Patient will decrease fascial restrictions to right shoulder region from mod to min. Long Term Goals Time to Complete Long Term Goals: 6 weeks Long Term Goal 1: Patient will return to highest level of independence to be able to take part in normal daily and leisure activities.  Long Term Goal 2: Patient will increase AROM to WNL to increase ability to perform daily housework without difficulty. Long Term Goal 3: Patient will decrease pain level in right shoulder to 0/10 when performing functional tasks. Long Term Goal 4: Patient will increase right shoulder strength to 5/5 to increase ability to return to [facility] and participate in desired activities.  Long Term Goal 5: Patient will decrease fascial restrictions to right shoulder from min to trace.  Problem List Patient Active Problem List   Diagnosis Date Noted  . Pain in joint, shoulder region 03/11/2013  . Muscle weakness (generalized) 03/11/2013  . Right ankle sprain 03/02/2013  . Shoulder contusion 03/02/2013  . Constipation 06/22/2012  . Colon cancer screening 06/22/2012  . Pain in soft tissues of limb 07/05/2009  . FLANK PAIN, RIGHT 04/04/2009  . ELECTROCARDIOGRAM, ABNORMAL 02/20/2009  . DIABETES MELLITUS, WITH NEUROLOGICAL  COMPLICATIONS [id number]  . HYPERTENSION 10/13/2008  . OSTEOARTHRITIS 10/13/2008  . LOW BACK  PAIN 10/13/2008    End of Session Activity Tolerance: Patient tolerated treatment well General Behavior During Therapy: WFL for tasks assessed/performed Cognition: WFL for tasks performed OT Plan of Care OT Home Exercise Plan: dowel rod exercises OT Patient Instructions: handout Consulted and Agree with Plan of Care: Patient  GO Functional Assessment Tool Used: DASH score of 54 with ideal score of 0. Functional Limitation: Carrying, moving and handling objects Carrying, Moving and Handling Objects Current Status [phone]): At least 40 percent but less than 60 percent impaired, limited or restricted Carrying, Moving and Handling Objects Goal Status [phone]): 0 percent impaired, limited or restricted  Ailene Ravel, OTR/L,CBIS   03/11/2013, 11:32 AM  Physician Documentation Your signature is required to indicate approval of the treatment plan as stated above.  Please sign and either send electronically or make a copy of this report for your files and return this physician signed original.  Please mark one 1.__approve of plan  2. ___approve of plan with the following conditions.   ______________________________                                                          _____________________ Physician Signature                                                                                                             Date

## 2013-03-18 ENCOUNTER — Ambulatory Visit (HOSPITAL_COMMUNITY)
Admission: RE | Admit: 2013-03-18 | Discharge: 2013-03-18 | Disposition: A | Discharge status: Home / Self Care | Payer: Medicare Other | Source: Ambulatory Visit | Attending: Family Medicine | Admitting: Family Medicine

## 2013-03-18 DIAGNOSIS — R269 Unspecified abnormalities of gait and mobility: Secondary | ICD-10-CM

## 2013-03-18 NOTE — Progress Notes (Signed)
Physical Therapy Treatment Patient Details  Name: Allison Gould MRN: [id number] Date of Birth: [birth year]  Today's Date: 03/18/2013 Time: [id number] PT Time Calculation (min): 43 min Charge: Therex 43'  Visit#: 2 of 8  Re-eval: 04/10/13 Assessment Diagnosis: Rt ankle sprain Surgical Date: 02/06/13 Next MD Visit: Dr. Aline Brochure - June 12  Authorization: [facility] [employer]  Authorization Time Period:    Authorization Visit#:   of     Subjective: Symptoms/Limitations Symptoms: Pain scale 3-4/10, pt reported compliance with HEP Pain Assessment Currently in Pain?: Yes Pain Score:   4 Pain Location: Ankle Pain Orientation: Right Pain Type: Acute pain  Objective :   Exercise/Treatments Ankle Stretches Soleus Stretch: 1 rep;30 seconds Gastroc Stretch: 2 reps;30 seconds Aerobic Exercises Stationary Bike: NuStep hill #2 SPM goal >100 Ankle Exercises - Seated Towel Crunch: 1 rep;Limitations Towel Crunch Limitations: 7 minutes  Towel Inversion/Eversion: 1 rep Marble Pickup: 15 marbles small and large, increased difficulty with large marbles.   Heel Raises: 15 reps Toe Raise: 15 reps  Physical Therapy Assessment and Plan PT Assessment and Plan Clinical Impression Statement: Began PT POC for ankle strenghening, ROM and activity tolerance.  Noted decreased intrinsic musculature, pt with difficulty completing towel crunch and marble pick ups.  Added Nustep at end of session for strengthening and to increase activity tolerance.  Pt encouraged to place ice on ankle at home for pain and edema control wtih LE elevated.   PT Plan: Continue with BLE strengthening ( NuStep, Bike, and progress to TM) and ankle/foot strengthening.     Goals    Problem List Patient Active Problem List   Diagnosis Date Noted  . Pain in joint, shoulder region 03/11/2013  . Muscle weakness (generalized) 03/11/2013  . Pain in joint, ankle and foot 03/11/2013  . Abnormality of gait 03/11/2013  . Right ankle sprain  03/02/2013  . Shoulder contusion 03/02/2013  . Constipation 06/22/2012  . Colon cancer screening 06/22/2012  . Pain in soft tissues of limb 07/05/2009  . FLANK PAIN, RIGHT 04/04/2009  . ELECTROCARDIOGRAM, ABNORMAL 02/20/2009  . DIABETES MELLITUS, WITH NEUROLOGICAL COMPLICATIONS [id number]  . HYPERTENSION 10/13/2008  . OSTEOARTHRITIS 10/13/2008  . LOW BACK PAIN 10/13/2008    PT - End of Session Activity Tolerance: Patient tolerated treatment well General Behavior During Therapy: WFL for tasks assessed/performed Cognition: WFL for tasks performed  GP    Aldona Lento 03/18/2013, 2:40 PM

## 2013-03-18 NOTE — Progress Notes (Signed)
Occupational Therapy Treatment Patient Details  Name: Allison Gould MRN: [id number] Date of Birth: 09-07-1951  Today's Date: 03/18/2013 Time: [id number] OT Time Calculation (min): 37 min Manual therapy [id number] 14' Therapeutic Exercises [phone] 23' Visit#: 2 of 12  Re-eval: 04/08/13    Authorization: [facility] Medicare  Authorization Time Period: before 10th visit  Authorization Visit#: 2 of 10  Subjective S:  I tried the exercises. Pain Assessment Currently in Pain?: Yes Pain Score:   5 Pain Location: Shoulder Pain Orientation: Right Pain Type: Acute pain  Precautions/Restrictions   n/a  Exercise/Treatments Supine Protraction: PROM;AAROM;10 reps Horizontal ABduction: PROM;AAROM;10 reps External Rotation: PROM;AAROM;10 reps Internal Rotation: PROM;AAROM;10 reps Flexion: PROM;AAROM;10 reps ABduction: PROM;AAROM;10 reps Seated Extension: Theraband;10 reps Theraband Level (Shoulder Extension): Level 2 (Red) Retraction: Theraband;10 reps Theraband Level (Shoulder Retraction): Level 2 (Red) Row: Theraband;10 reps Theraband Level (Shoulder Row): Level 2 (Red) Protraction: AAROM;10 reps Horizontal ABduction: AAROM;10 reps External Rotation: AAROM;10 reps Internal Rotation: AAROM;10 reps Flexion: AAROM;10 reps Abduction: AAROM;10 reps Therapy Ball Right/Left: 5 reps    Manual Therapy Manual Therapy: Myofascial release Myofascial Release: MFR and manual stretching to right upper arm, shoulder region, scapular region, and trapezius region to decrease pain and fascial restrictions and increase pain free mobility.    Occupational Therapy Assessment and Plan OT Assessment and Plan Clinical Impression Statement: A:  Patient demonstrated WFL AAROM in supine, therefore added AAROM in seated for increased shoulder AROM and scapular stability.   OT Plan: P:  Attempt AROM in supien, add wall wash, prot/ret//elev/dep, thumb tacks, and proximal shoulder strengtheing in seated for  increased scapular stability.     Goals Short Term Goals Time to Complete Short Term Goals: 3 weeks Short Term Goal 1: Patient will be educated on HEP. Short Term Goal 1 Progress: Progressing toward goal Short Term Goal 2: Patient will increase PROM to WNL to increase ability to donn shirt without fatigue and increased time.  Short Term Goal 2 Progress: Progressing toward goal Short Term Goal 3: Patient will decrease pain level in Right shoulder to 1/10 when performing functional tasks. Short Term Goal 3 Progress: Progressing toward goal Short Term Goal 4: Patient will increase right shoulder strengthe to 4+/5 to increase ability to lift dishes into cabient. Short Term Goal 4 Progress: Progressing toward goal Short Term Goal 5: Patient will decrease fascial restrictions to right shoulder region from mod to min. Short Term Goal 5 Progress: Progressing toward goal Long Term Goals Time to Complete Long Term Goals: 6 weeks Long Term Goal 1: Patient will return to highest level of independence to be able to take part in normal daily and leisure activities.  Long Term Goal 1 Progress: Progressing toward goal Long Term Goal 2: Patient will increase AROM to WNL to increase ability to perform daily housework without difficulty. Long Term Goal 2 Progress: Progressing toward goal Long Term Goal 3: Patient will decrease pain level in right shoulder to 0/10 when performing functional tasks. Long Term Goal 3 Progress: Progressing toward goal Long Term Goal 4: Patient will increase right shoulder strength to 5/5 to increase ability to return to [facility] and participate in desired activities.  Long Term Goal 4 Progress: Progressing toward goal Long Term Goal 5: Patient will decrease fascial restrictions to right shoulder from min to trace. Long Term Goal 5 Progress: Progressing toward goal  Problem List Patient Active Problem List   Diagnosis Date Noted  . Pain in joint, shoulder region 03/11/2013  .  Muscle weakness (generalized) 03/11/2013  .  Pain in joint, ankle and foot 03/11/2013  . Abnormality of gait 03/11/2013  . Right ankle sprain 03/02/2013  . Shoulder contusion 03/02/2013  . Constipation 06/22/2012  . Colon cancer screening 06/22/2012  . Pain in soft tissues of limb 07/05/2009  . FLANK PAIN, RIGHT 04/04/2009  . ELECTROCARDIOGRAM, ABNORMAL 02/20/2009  . DIABETES MELLITUS, WITH NEUROLOGICAL COMPLICATIONS [id number]  . HYPERTENSION 10/13/2008  . OSTEOARTHRITIS 10/13/2008  . LOW BACK PAIN 10/13/2008    End of Session Activity Tolerance: Patient tolerated treatment well General Behavior During Therapy: [facility] for tasks assessed/performed Cognition: WFL for tasks performed  Robinson, OTR/L  03/18/2013, 1:47 PM

## 2013-03-23 ENCOUNTER — Ambulatory Visit (HOSPITAL_COMMUNITY)
Admission: RE | Admit: 2013-03-23 | Discharge: 2013-03-23 | Disposition: A | Discharge status: Home / Self Care | Payer: Medicare Other | Source: Ambulatory Visit | Attending: Family Medicine | Admitting: Family Medicine

## 2013-03-23 ENCOUNTER — Ambulatory Visit (HOSPITAL_COMMUNITY): Payer: Medicare Other

## 2013-03-23 NOTE — Progress Notes (Signed)
Occupational Therapy Treatment Patient Details  Name: Allison Gould MRN: [id number] Date of Birth: 24-Mar-1951  Today's Date: 03/23/2013 Time: [id number] OT Time Calculation (min): 45 min MFR [id number] 13' Therex [id number] 32'  Visit#: 3 of 12  Re-eval: 04/08/13    Authorization: [facility] Medicare  Authorization Time Period: before 10th visit  Authorization Visit#: 3 of 10  Subjective Symptoms/Limitations Symptoms: S: My shoulder feels good. Just a little bit of tightness.  Pain Assessment Currently in Pain?: Yes Pain Score:   1 Pain Location: Shoulder Pain Orientation: Right Pain Type: Acute pain  Precautions/Restrictions  Precautions Precautions: None  Exercise/Treatments Supine Protraction: PROM;AROM;10 reps Horizontal ABduction: PROM;AROM;10 reps External Rotation: PROM;AROM;10 reps Internal Rotation: PROM;AROM;10 reps Flexion: PROM;AROM;10 reps ABduction: PROM;AROM;10 reps Seated Extension: Theraband;15 reps Theraband Level (Shoulder Extension): Level 2 (Red) Retraction: Theraband;15 reps Theraband Level (Shoulder Retraction): Level 2 (Red) Row: Theraband;15 reps Theraband Level (Shoulder Row): Level 2 (Red) Protraction: AAROM;12 reps Horizontal ABduction: AAROM;12 reps External Rotation: AAROM;12 reps Internal Rotation: AAROM;12 reps Flexion: AAROM;12 reps Abduction: AAROM;12 reps Therapy Ball Right/Left: 5 reps ROM / Strengthening / Isometric Strengthening Thumb Tacks: 1' Proximal Shoulder Strengthening, Seated: 10X Prot/Ret//Elev/Dep: 1'      Manual Therapy Manual Therapy: Myofascial release Myofascial Release: MFR and manual stretching to right upper arm, shoulder region, scapular region, and trapezius region to decrease pain and fascial restrictions and increase pain free mobility.   Occupational Therapy Assessment and Plan OT Assessment and Plan Clinical Impression Statement: A: Added AROM supine, thumb tacks, pro/ret/elev/dep, and proximal shoulder  strengtheing. Tolerated well.  OT Plan: P: Progress to AROM seated when able.    Goals Short Term Goals Time to Complete Short Term Goals: 3 weeks Short Term Goal 1: Patient will be educated on HEP. Short Term Goal 1 Progress: Progressing toward goal Short Term Goal 2: Patient will increase PROM to WNL to increase ability to donn shirt without fatigue and increased time.  Short Term Goal 2 Progress: Progressing toward goal Short Term Goal 3: Patient will decrease pain level in Right shoulder to 1/10 when performing functional tasks. Short Term Goal 3 Progress: Progressing toward goal Short Term Goal 4: Patient will increase right shoulder strengthe to 4+/5 to increase ability to lift dishes into cabient. Short Term Goal 4 Progress: Progressing toward goal Short Term Goal 5: Patient will decrease fascial restrictions to right shoulder region from mod to min. Short Term Goal 5 Progress: Progressing toward goal Long Term Goals Time to Complete Long Term Goals: 6 weeks Long Term Goal 1: Patient will return to highest level of independence to be able to take part in normal daily and leisure activities.  Long Term Goal 1 Progress: Progressing toward goal Long Term Goal 2: Patient will increase AROM to WNL to increase ability to perform daily housework without difficulty. Long Term Goal 2 Progress: Progressing toward goal Long Term Goal 3: Patient will decrease pain level in right shoulder to 0/10 when performing functional tasks. Long Term Goal 3 Progress: Progressing toward goal Long Term Goal 4: Patient will increase right shoulder strength to 5/5 to increase ability to return to [facility] and participate in desired activities.  Long Term Goal 4 Progress: Progressing toward goal Long Term Goal 5: Patient will decrease fascial restrictions to right shoulder from min to trace. Long Term Goal 5 Progress: Progressing toward goal  Problem List Patient Active Problem List   Diagnosis Date Noted  .  Pain in joint, shoulder region 03/11/2013  . Muscle weakness (generalized) 03/11/2013  .  Pain in joint, ankle and foot 03/11/2013  . Abnormality of gait 03/11/2013  . Right ankle sprain 03/02/2013  . Shoulder contusion 03/02/2013  . Constipation 06/22/2012  . Colon cancer screening 06/22/2012  . Pain in soft tissues of limb 07/05/2009  . FLANK PAIN, RIGHT 04/04/2009  . ELECTROCARDIOGRAM, ABNORMAL 02/20/2009  . DIABETES MELLITUS, WITH NEUROLOGICAL COMPLICATIONS [id number]  . HYPERTENSION 10/13/2008  . OSTEOARTHRITIS 10/13/2008  . LOW BACK PAIN 10/13/2008    End of Session Activity Tolerance: Patient tolerated treatment well General Behavior During Therapy: [facility] for tasks assessed/performed Cognition: [facility] for tasks performed   Ailene Ravel, OTR/L,CBIS   03/23/2013, 1:03 PM

## 2013-03-29 ENCOUNTER — Telehealth (HOSPITAL_COMMUNITY): Payer: Self-pay | Admitting: Dietician

## 2013-03-29 NOTE — Telephone Encounter (Signed)
Called pt at [phone]. Appointment must be rescheduled due to provider scheduling conflict. Appointment rescheduled for 04/20/13 at 0900.

## 2013-04-05 ENCOUNTER — Ambulatory Visit (HOSPITAL_COMMUNITY)
Admission: RE | Admit: 2013-04-05 | Discharge: 2013-04-05 | Disposition: A | Discharge status: Home / Self Care | Payer: Medicare Other | Source: Ambulatory Visit | Attending: Orthopedic Surgery | Admitting: Orthopedic Surgery

## 2013-04-05 DIAGNOSIS — M6281 Muscle weakness (generalized): Secondary | ICD-10-CM | POA: Insufficient documentation

## 2013-04-05 DIAGNOSIS — M25519 Pain in unspecified shoulder: Secondary | ICD-10-CM | POA: Insufficient documentation

## 2013-04-05 DIAGNOSIS — IMO0001 Reserved for inherently not codable concepts without codable children: Secondary | ICD-10-CM | POA: Insufficient documentation

## 2013-04-05 DIAGNOSIS — M25619 Stiffness of unspecified shoulder, not elsewhere classified: Secondary | ICD-10-CM | POA: Insufficient documentation

## 2013-04-05 NOTE — Progress Notes (Signed)
Occupational Therapy Treatment Patient Details  Name: Allison Gould MRN: [id number] Date of Birth: Mar 13, 1951  Today's Date: 04/05/2013 Time: [id number] OT Time Calculation (min): 43 min MFR [phone] 11' Therex [phone] 32'   Visit#: 4 of 12  Re-eval: 04/08/13    Authorization: [employer]  Authorization Time Period: before 10th visit  Authorization Visit#: 4 of 10  Subjective Symptoms/Limitations Symptoms: S: My knee is the only thing hurting me today. My shoulder is just a little sore.  Pain Assessment Currently in Pain?: Yes Pain Score:   2 Pain Location: Shoulder Pain Orientation: Right Pain Type: Acute pain  Precautions/Restrictions  Precautions Precautions: None  Exercise/Treatments Supine Protraction: PROM;10 reps;Strengthening;15 reps Protraction Weight (lbs): 1 Horizontal ABduction: PROM;10 reps;Strengthening;15 reps Horizontal ABduction Weight (lbs): 1 External Rotation: PROM;10 reps;Strengthening;15 reps External Rotation Weight (lbs): 1 Internal Rotation: PROM;10 reps;Strengthening;15 reps Internal Rotation Weight (lbs): 1 Flexion: PROM;10 reps;Strengthening;15 reps Shoulder Flexion Weight (lbs): 1 ABduction: PROM;10 reps;Strengthening;15 reps Shoulder ABduction Weight (lbs): 1 Standing Horizontal ABduction: Theraband;15 reps Theraband Level (Shoulder Horizontal ABduction): Level 2 (Red) External Rotation: Theraband;15 reps Theraband Level (Shoulder External Rotation): Level 2 (Red) Internal Rotation: Theraband;15 reps Theraband Level (Shoulder Internal Rotation): Level 2 (Red) Extension: Theraband;15 reps Theraband Level (Shoulder Extension): Level 2 (Red) Row: Theraband;15 reps Theraband Level (Shoulder Row): Level 2 (Red) Retraction: Theraband;15 reps Theraband Level (Shoulder Retraction): Level 2 (Red) ROM / Strengthening / Isometric Strengthening UBE (Upper Arm Bike): 1.0 3' reverse 3' forward       Manual Therapy Manual Therapy: Myofascial  release Myofascial Release: MFR and manual stretching to right upper arm, shoulder region, scapular region, and trapezius region to decrease pain and fascial restrictions and increase pain free mobility.   Occupational Therapy Assessment and Plan OT Assessment and Plan Clinical Impression Statement: A: Added 1# weight supine, theraband, and UBE bike.  Tolerated exercises without pain. vc's needed for form and technique. OT Plan: P: Progress to 2# when able to tolerate.   Goals Short Term Goals Time to Complete Short Term Goals: 3 weeks Short Term Goal 1: Patient will be educated on HEP. Short Term Goal 1 Progress: Progressing toward goal Short Term Goal 2: Patient will increase PROM to WNL to increase ability to donn shirt without fatigue and increased time.  Short Term Goal 2 Progress: Progressing toward goal Short Term Goal 3: Patient will decrease pain level in Right shoulder to 1/10 when performing functional tasks. Short Term Goal 3 Progress: Progressing toward goal Short Term Goal 4: Patient will increase right shoulder strengthe to 4+/5 to increase ability to lift dishes into cabient. Short Term Goal 4 Progress: Progressing toward goal Short Term Goal 5: Patient will decrease fascial restrictions to right shoulder region from mod to min. Long Term Goals Time to Complete Long Term Goals: 6 weeks Long Term Goal 1: Patient will return to highest level of independence to be able to take part in normal daily and leisure activities.  Long Term Goal 1 Progress: Progressing toward goal Long Term Goal 2: Patient will increase AROM to WNL to increase ability to perform daily housework without difficulty. Long Term Goal 2 Progress: Progressing toward goal Long Term Goal 3: Patient will decrease pain level in right shoulder to 0/10 when performing functional tasks. Long Term Goal 3 Progress: Progressing toward goal Long Term Goal 4: Patient will increase right shoulder strength to 5/5 to  increase ability to return to [facility] and participate in desired activities.  Long Term Goal 4 Progress: Progressing toward goal  Long Term Goal 5: Patient will decrease fascial restrictions to right shoulder from min to trace. Long Term Goal 5 Progress: Progressing toward goal  Problem List Patient Active Problem List   Diagnosis Date Noted  . Pain in joint, shoulder region 03/11/2013  . Muscle weakness (generalized) 03/11/2013  . Pain in joint, ankle and foot 03/11/2013  . Abnormality of gait 03/11/2013  . Right ankle sprain 03/02/2013  . Shoulder contusion 03/02/2013  . Constipation 06/22/2012  . Colon cancer screening 06/22/2012  . Pain in soft tissues of limb 07/05/2009  . FLANK PAIN, RIGHT 04/04/2009  . ELECTROCARDIOGRAM, ABNORMAL 02/20/2009  . DIABETES MELLITUS, WITH NEUROLOGICAL COMPLICATIONS [id number]  . HYPERTENSION 10/13/2008  . OSTEOARTHRITIS 10/13/2008  . LOW BACK PAIN 10/13/2008    End of Session Activity Tolerance: Patient tolerated treatment well General Behavior During Therapy: [facility] for tasks assessed/performed Cognition: [facility] for tasks performed   Ailene Ravel, OTR/L,CBIS   04/05/2013, 10:40 AM

## 2013-04-07 ENCOUNTER — Ambulatory Visit (HOSPITAL_COMMUNITY)
Admission: RE | Admit: 2013-04-07 | Discharge: 2013-04-07 | Disposition: A | Discharge status: Home / Self Care | Payer: Medicare Other | Source: Ambulatory Visit | Attending: Family Medicine | Admitting: Family Medicine

## 2013-04-07 NOTE — Progress Notes (Signed)
Occupational Therapy Treatment Patient Details  Name: Allison Gould MRN: [id number] Date of Birth: December 15, 1950  Today's Date: 04/07/2013 Time: [id number] OT Time Calculation (min): 36 min Manual Therapy [phone] 12' ROM/MMT [phone] 17' Visit#: 5 of 12  Re-eval: 04/08/13    Authorization: [facility] Medicare  Authorization Time Period: before 10th visit  Authorization Visit#: 5 of 10  Subjective S:  It feels tired more than anything, but it doesnt hurt.   Special Tests: DASH scored 18 with ideal score being 0. Pain Assessment Currently in Pain?: No/denies Pain Score: 0-No pain  Precautions/Restrictions   N/A  Exercise/Treatments    Manual Therapy Manual Therapy: Myofascial release Myofascial Release: MFR and manual stretching to right upper arm, shoulder region, scapular region, and trapezius region to decrease pain and fascial restrictions and increase pain free mobility.   Occupational Therapy Assessment and Plan OT Assessment and Plan Clinical Impression Statement: A:  Please see dc summary:  seated AROM and strength flexion 140 5/5, abduction 150 5/5, external rotation with shoulder adducted 65 5/5, internal rotation with shoulder adducted 90 5/5.  supine A/PROM current (initial evaluation):  flexion 160 (125/155), abduction 160 (100/105), external rotation with shoulder adducted 65 (64), internal rotation with shoulder adducted 90 (90).  Strength was 4-/5 at initial evaluation.   OT Plan: P:  DC from skilled OT intervention this date as she has met all short term and long term goals.  She will continue HEP of dowel rod and strengthening exercises.     Goals Short Term Goals Time to Complete Short Term Goals: 3 weeks Short Term Goal 1: Patient will be educated on HEP. Short Term Goal 1 Progress: Met Short Term Goal 2: Patient will increase PROM to WNL to increase ability to donn shirt without fatigue and increased time.  Short Term Goal 2 Progress: Met Short Term Goal 3: Patient  will decrease pain level in Right shoulder to 1/10 when performing functional tasks. Short Term Goal 3 Progress: Met Short Term Goal 4: Patient will increase right shoulder strengthe to 4+/5 to increase ability to lift dishes into cabient. Short Term Goal 4 Progress: Met Short Term Goal 5: Patient will decrease fascial restrictions to right shoulder region from mod to min. Short Term Goal 5 Progress: Met Long Term Goals Time to Complete Long Term Goals: 6 weeks Long Term Goal 1: Patient will return to highest level of independence to be able to take part in normal daily and leisure activities.  Long Term Goal 1 Progress: Met Long Term Goal 2: Patient will increase AROM to WNL to increase ability to perform daily housework without difficulty. Long Term Goal 2 Progress: Met Long Term Goal 3: Patient will decrease pain level in right shoulder to 0/10 when performing functional tasks. Long Term Goal 3 Progress: Met Long Term Goal 4: Patient will increase right shoulder strength to 5/5 to increase ability to return to [facility] and participate in desired activities.  Long Term Goal 4 Progress: Met Long Term Goal 5: Patient will decrease fascial restrictions to right shoulder from min to trace. Long Term Goal 5 Progress: Met  Problem List Patient Active Problem List   Diagnosis Date Noted  . Pain in joint, shoulder region 03/11/2013  . Muscle weakness (generalized) 03/11/2013  . Pain in joint, ankle and foot 03/11/2013  . Abnormality of gait 03/11/2013  . Right ankle sprain 03/02/2013  . Shoulder contusion 03/02/2013  . Constipation 06/22/2012  . Colon cancer screening 06/22/2012  . Pain in soft tissues  of limb 07/05/2009  . FLANK PAIN, RIGHT 04/04/2009  . ELECTROCARDIOGRAM, ABNORMAL 02/20/2009  . DIABETES MELLITUS, WITH NEUROLOGICAL COMPLICATIONS [id number]  . HYPERTENSION 10/13/2008  . OSTEOARTHRITIS 10/13/2008  . LOW BACK PAIN 10/13/2008    End of Session Activity Tolerance: Patient  tolerated treatment well General Behavior During Therapy: WFL for tasks assessed/performed Cognition: WFL for tasks performed  GO Functional Assessment Tool Used: DASH score was 54 and is now 18, with ideal score being 0. Functional Limitation: Carrying, moving and handling objects Carrying, Moving and Handling Objects Goal Status [id number]): 0 percent impaired, limited or restricted Carrying, Moving and Handling Objects Discharge Status [phone]): At least 1 percent but less than 20 percent impaired, limited or restricted  Vangie Bicker, OTR/L  04/07/2013, 10:16 AM

## 2013-04-11 ENCOUNTER — Ambulatory Visit (HOSPITAL_COMMUNITY)
Admission: RE | Admit: 2013-04-11 | Discharge: 2013-04-11 | Disposition: A | Discharge status: Home / Self Care | Payer: Medicare Other | Source: Ambulatory Visit | Attending: Orthopedic Surgery | Admitting: Orthopedic Surgery

## 2013-04-11 DIAGNOSIS — R269 Unspecified abnormalities of gait and mobility: Secondary | ICD-10-CM

## 2013-04-11 DIAGNOSIS — M25571 Pain in right ankle and joints of right foot: Secondary | ICD-10-CM

## 2013-04-11 NOTE — Evaluation (Signed)
Physical Therapy Discharge/Treatment Patient Details  Name: Willona Delk MRN: [id number] Date of Birth: 13-Aug-1951  Today's Date: 04/11/2013 Time: [id number] PT Time Calculation (min): 26 min Charges: TE: [id number] MMT/ROM: 1             Visit#: 3 of 8  Re-eval: 04/10/13  Diagnosis: Rt ankle sprain Date of injury: 02/06/13 Next MD Visit: Dr. Aline Brochure - June 12  Authorization: [facility] Medicare    Authorization Time Period:    Authorization Visit#: 3 of 10   Subjective Symptoms/Limitations Symptoms: Pt reports that her ankle is feeling pretty good today.  Reports that she is walking on it pretty well.  She is able to walk with her grandchildren through the store without pain.  She reports that she has returned from her trip and did not have a lot of pain.  She is not wearing her ankle brace.  Her complaints  Pain Assessment Pain Score: 0-No pain  RLE AROM (degrees) Right Ankle Dorsiflexion: 10 (was -5) Right Ankle Plantar Flexion: 40 (was 40) Right Ankle Inversion: 35 (was 30) Right Ankle Eversion: 22 (was 10) RLE Strength Right Hip Flexion: 3+/5 (was 3+/5) Right Hip Extension:  (was 3+/5) Right Hip ABduction:  (was 3+/5) Right Hip ADduction:  (was 2+/5) Right Knee Flexion:  (was 4/5) Right Knee Extension:  (was 4/5) Right Ankle Dorsiflexion: 5/5 (was 4/5) Right Ankle Plantar Flexion: 3/5 (was 2+/5) Right Ankle Inversion: 4/5 (was 3/5) Right Ankle Eversion: 4/5 (was 2+/5) Palpation Palpation: mild pain and tenderness to lateral ankle.   Exercise/Treatments Mobility/Balance  Ambulation/Gait Gait Pattern: Lateral hip instability;Decreased hip/knee flexion - right Static Standing Balance Single Leg Stance - Right Leg: 2 (was 0)  Supine Straight Leg Raises: Right;10 reps;Limitations Straight Leg Raises Limitations: 3 sec holds Sidelying Hip ABduction: Right;10 reps;Limitations Hip ABduction Limitations: 3 sec holds Ankle Exercises - Seated Towel Crunch: 2 reps Marble  Pickup: 15 marbles small and large, increased difficulty with large marbles.    Physical Therapy Assessment and Plan PT Assessment and Plan Clinical Impression Statement: Ms. Guerrier has attended 3 OP PT visits in 4 weeks and was limited due to vacation and difficulty with scheduling times with the following findings: decreased ankle pain, improved ankle mobility and strength, has greatest limiiations with Rt knee pain and decreased strength throughout her hip and core region.  At this time will d/c from PT with HEP and will f/u with MD about knee pain.  PT Plan: D/C    Goals Home Exercise Program Pt will Perform Home Exercise Program: Independently PT Goal: Perform Home Exercise Program - Progress: Met PT Short Term Goals Time to Complete Short Term Goals: 2 weeks PT Short Term Goal 1: Pt will improve her pain to less than 3/10 during the night time hours.   (1/10) PT Short Term Goal 1 - Progress: Met PT Short Term Goal 2: Pt will improve Rt LE strength to WNL in order to ascend and descend 5 steps with 1 handrail with reciprocal pattern in order to enter community dwellings.  (step to for knee pain) PT Short Term Goal 2 - Progress: Progressing toward goal PT Short Term Goal 3: Pt wil improved activitty tolerance and walk on the treadmill x15 minutes in order to prepare for her upcoming trip to [place].  (able to go to [place] without difficulty, pain to knee) PT Short Term Goal 3 - Progress: Met PT Long Term Goals Time to Complete Long Term Goals: 4 weeks PT Long Term Goal 1: Pt  will improve her ankle AROM to [facility] in order to have greater ease with ascending and descending steps.  PT Long Term Goal 1 - Progress: Partly met PT Long Term Goal 2: Pt will improve her dynamic balance in order to walk on the treadmill with unilateral UE support in order to continue with TM walking at the gym to progress towards and HEP.  (limited due to knee pain) PT Long Term Goal 2 - Progress: Progressing toward  goal  Problem List Patient Active Problem List   Diagnosis Date Noted  . Pain in joint, shoulder region 03/11/2013  . Muscle weakness (generalized) 03/11/2013  . Pain in joint, ankle and foot 03/11/2013  . Abnormality of gait 03/11/2013  . Right ankle sprain 03/02/2013  . Shoulder contusion 03/02/2013  . Constipation 06/22/2012  . Colon cancer screening 06/22/2012  . Pain in soft tissues of limb 07/05/2009  . FLANK PAIN, RIGHT 04/04/2009  . ELECTROCARDIOGRAM, ABNORMAL 02/20/2009  . DIABETES MELLITUS, WITH NEUROLOGICAL COMPLICATIONS [id number]  . HYPERTENSION 10/13/2008  . OSTEOARTHRITIS 10/13/2008  . LOW BACK PAIN 10/13/2008    PT - End of Session Activity Tolerance: Patient tolerated treatment well General Behavior During Therapy: WFL for tasks assessed/performed Cognition: WFL for tasks performed PT Plan of Care PT Patient Instructions: discussed contining with HEP to improve hip and knee strength to encourage improved balance and decrease ankle pain.  Consulted and Agree with Plan of Care: Patient  GP    Geoffery Lyons, MPT, ATC 04/11/2013, 12:26 PM  Physician Documentation Your signature is required to indicate approval of the treatment plan as stated above.  Please sign and either send electronically or make a copy of this report for your files and return this physician signed original.   Please mark one 1.__approve of plan  2. ___approve of plan with the following conditions.   ______________________________                                                          _____________________ Physician Signature                                                                                                             Date

## 2013-04-14 ENCOUNTER — Ambulatory Visit (INDEPENDENT_AMBULATORY_CARE_PROVIDER_SITE_OTHER): Payer: Medicare Other | Admitting: Orthopedic Surgery

## 2013-04-14 ENCOUNTER — Encounter: Payer: Self-pay | Admitting: Orthopedic Surgery

## 2013-04-14 VITALS — BP 130/68 | Ht 63.0 in | Wt 304.0 lb

## 2013-04-14 DIAGNOSIS — M25569 Pain in unspecified knee: Secondary | ICD-10-CM

## 2013-04-14 DIAGNOSIS — R29898 Other symptoms and signs involving the musculoskeletal system: Secondary | ICD-10-CM

## 2013-04-14 DIAGNOSIS — S40011D Contusion of right shoulder, subsequent encounter: Secondary | ICD-10-CM

## 2013-04-14 DIAGNOSIS — S93401D Sprain of unspecified ligament of right ankle, subsequent encounter: Secondary | ICD-10-CM

## 2013-04-14 DIAGNOSIS — M549 Dorsalgia, unspecified: Secondary | ICD-10-CM

## 2013-04-14 DIAGNOSIS — M25561 Pain in right knee: Secondary | ICD-10-CM

## 2013-04-14 DIAGNOSIS — Z5189 Encounter for other specified aftercare: Secondary | ICD-10-CM

## 2013-04-14 NOTE — Patient Instructions (Signed)
Call hospital to arrange PT   Sciatica Sciatica is pain, weakness, numbness, or tingling along the path of the sciatic nerve. The nerve starts in the lower back and runs down the back of each leg. The nerve controls the muscles in the lower leg and in the back of the knee, while also providing sensation to the back of the thigh, lower leg, and the sole of your foot. Sciatica is a symptom of another medical condition. For instance, nerve damage or certain conditions, such as a herniated disk or bone spur on the spine, pinch or put pressure on the sciatic nerve. This causes the pain, weakness, or other sensations normally associated with sciatica. Generally, sciatica only affects one side of the body. CAUSES   Herniated or slipped disc.  Degenerative disk disease.  A pain disorder involving the narrow muscle in the buttocks (piriformis syndrome).  Pelvic injury or fracture.  Pregnancy.  Tumor (rare). SYMPTOMS  Symptoms can vary from mild to very severe. The symptoms usually travel from the low back to the buttocks and down the back of the leg. Symptoms can include:  Mild tingling or dull aches in the lower back, leg, or hip.  Numbness in the back of the calf or sole of the foot.  Burning sensations in the lower back, leg, or hip.  Sharp pains in the lower back, leg, or hip.  Leg weakness.  Severe back pain inhibiting movement. These symptoms may get worse with coughing, sneezing, laughing, or prolonged sitting or standing. Also, being overweight may worsen symptoms. DIAGNOSIS  Your caregiver will perform a physical exam to look for common symptoms of sciatica. He or she may ask you to do certain movements or activities that would trigger sciatic nerve pain. Other tests may be performed to find the cause of the sciatica. These may include:  Blood tests.  X-rays.  Imaging tests, such as an MRI or CT scan. TREATMENT  Treatment is directed at the cause of the sciatic pain.  Sometimes, treatment is not necessary and the pain and discomfort goes away on its own. If treatment is needed, your caregiver may suggest:  Over-the-counter medicines to relieve pain.  Prescription medicines, such as anti-inflammatory medicine, muscle relaxants, or narcotics.  Applying heat or ice to the painful area.  Steroid injections to lessen pain, irritation, and inflammation around the nerve.  Reducing activity during periods of pain.  Exercising and stretching to strengthen your abdomen and improve flexibility of your spine. Your caregiver may suggest losing weight if the extra weight makes the back pain worse.  Physical therapy.  Surgery to eliminate what is pressing or pinching the nerve, such as a bone spur or part of a herniated disk. HOME CARE INSTRUCTIONS   Only take over-the-counter or prescription medicines for pain or discomfort as directed by your caregiver.  Apply ice to the affected area for 20 minutes, 3 4 times a day for the first 48 72 hours. Then try heat in the same way.  Exercise, stretch, or perform your usual activities if these do not aggravate your pain.  Attend physical therapy sessions as directed by your caregiver.  Keep all follow-up appointments as directed by your caregiver.  Do not wear high heels or shoes that do not provide proper support.  Check your mattress to see if it is too soft. A firm mattress may lessen your pain and discomfort. SEEK IMMEDIATE MEDICAL CARE IF:   You lose control of your bowel or bladder (incontinence).  You  have increasing weakness in the lower back, pelvis, buttocks, or legs.  You have redness or swelling of your back.  You have a burning sensation when you urinate.  You have pain that gets worse when you lie down or awakens you at night.  Your pain is worse than you have experienced in the past.  Your pain is lasting longer than 4 weeks.  You are suddenly losing weight without reason. MAKE SURE  YOU:  Understand these instructions.  Will watch your condition.  Will get help right away if you are not doing well or get worse. Document Released: 10/14/2001 Document Revised: 04/20/2012 Document Reviewed: 02/29/2012 [place] Patient Information 2014 [place].

## 2013-04-14 NOTE — Progress Notes (Signed)
Patient ID: Allison Gould, female   DOB: June 15, 1951, 62 y.o.   MRN: [id number] Chief Complaint  Patient presents with  . Follow-up    6 week recheck on right ankle sprain and right shoulder contusion. DOI 02-13-13.    BP 130/68  Ht 5\' 3"  (1.6 m)  Wt 304 lb (137.893 kg)  BMI 53.86 kg/m2  Encounter Diagnoses  Name Primary?  . Leg weakness Yes  . Back pain   . Knee pain, right   . Shoulder contusion, right, subsequent encounter   . Right ankle sprain, subsequent encounter     Past Medical History  Diagnosis Date  . Diabetes mellitus type II   . Hypertension   . Low back pain   . Osteoarthritis   . Anxiety   . Lipid disorder   . Morbid obesity with BMI of 45.0-49.9, adult   . GERD (gastroesophageal reflux disease)    62 yo female recently seen for acute ankle injury acute right shoulder contusion status post distal clavicle excision and rotator cuff surgery 2 years ago. Ankle incident occurred April 4 of this year. She underwent therapy did well. Does complain of right knee pain with giving out symptoms intermittent dull aching pain occasional knee pain but primary symptom is at the right leg gives way. No worsening or improving factors. Nothing makes it better or worse. Denies numbness or tingling just some intermittent low back pain. She denies weight loss or urinary dysfunction  Again shoulder and ankle have improved  Vital signs are recorded above  Patient has morbid obesity. She is oriented x3 she is pleasant mood normal affect she is ambulatory without assistive devices. Her right ankle shows restricted range of motion but but this is chronic. She has a flat foot with no tenderness medially no tenderness laterally she is stable drawer sign. She has normal muscle tone no atrophy and skin is intact and normal with a good pulse normal temperature and no sensory deficits  Right knee evaluation no tenderness her range of motion is limited by the size of the leg but she has adequate  functional range of motion laterals and cruciate ligaments are stable she has excellent strength in the leg her skin is intact she does not have any swelling or tenderness  With a stable knee I think her back is probably this primary source of her knee pain in her giving way symptoms  Recommend lumbar stabilization program. No surgical treatment needed at this time

## 2013-04-19 ENCOUNTER — Ambulatory Visit (HOSPITAL_COMMUNITY)
Admission: RE | Admit: 2013-04-19 | Discharge: 2013-04-19 | Disposition: A | Discharge status: Home / Self Care | Payer: Medicare Other | Source: Ambulatory Visit | Attending: Orthopedic Surgery | Admitting: Orthopedic Surgery

## 2013-04-19 NOTE — Evaluation (Signed)
Physical Therapy Evaluation  Patient Details  Name: Allison Gould MRN: [id number] Date of Birth: 03-13-1951  Today's Date: 04/19/2013 Time: [id number] PT Time Calculation (min): 39 min Charges: 1 evaluation TE; 1005-1015             Visit#: 1 of 8  Re-eval: 05/19/13 Assessment Diagnosis: Knee pain/lumbar stabilization Next MD Visit: Dr. Aline Brochure  Authorization: [facility] Medicare    Authorization Time Period:    Authorization Visit#: 1 of 10   Past Medical History:  Past Medical History  Diagnosis Date  . Diabetes mellitus type II   . Hypertension   . Low back pain   . Osteoarthritis   . Anxiety   . Lipid disorder   . Morbid obesity with BMI of 45.0-49.9, adult   . GERD (gastroesophageal reflux disease)    Past Surgical History:  Past Surgical History  Procedure Laterality Date  . Lipoma-right shoulder    . Spur and nerve repair right shoulder    . Colonoscopy  07/12/2012    Procedure: COLONOSCOPY;  Surgeon: Danie Binder, MD;  Location: [facility];  Service: Endoscopy;  Laterality: N/A;  1:00    Subjective Symptoms/Limitations Pertinent History: Pt is a 62 year old female recently d/c from PT for ankle pain and is referred back to PT for lumbar stabalization.  She reports that she has numbness and buring to her Rt knee to the front back of her knee and is worse at night time. She reports that it started when she feel at church which also left Rt shoulder and Rt ankle painful.  Her How long can you stand comfortably?: difficulty washing dishes and laundry  How long can you walk comfortably?: 15 minutes, has to use grocery cart in the store because of back pain.  Patient Stated Goals: Pt wishes her knee to feel better.  Pain Assessment Currently in Pain?: Yes Pain Score:   8 (at night time) Pain Location: Knee (burning, numbness) Pain Type: Neuropathic pain Pain Radiating Towards: Rt knee Pain Relieving Factors: ice, tylenol  Precautions/Restrictions   Precautions Precautions: None  Balance Screening Balance Screen Has the patient fallen in the past 6 months: Yes How many times?: 2 Has the patient had a decrease in activity level because of a fear of falling? : Yes Is the patient reluctant to leave their home because of a fear of falling? : No  Cognition/Observation Observation/Other Assessments Observations: hinging at L3  Sensation/Coordination/Flexibility/Functional Tests Coordination Gross Motor Movements are Fluid and Coordinated: No Coordination and Movement Description: impaired coordiantion to transverse abdominus and multifidus Flexibility 90/90: Positive Functional Tests Functional Tests: Oswestry disability index (ODI):  Functional Tests: + FABER  Assessment RLE Strength Right Hip Flexion: 4/5 Right Hip Extension: 3+/5 Right Hip ABduction: 4/5 Right Knee Flexion: 5/5 Right Knee Extension: 5/5 Lumbar Assessment Lumbar Assessment: Within Functional Limits Palpation Palpation: maximal fascial restrictions to Rt gluteal/piriformis region with pain and tenderness  Mobility/Balance  Posture/Postural Control Posture/Postural Control: Postural limitations Postural Limitations: slouched with rounded shoulders,  Hinging at L3   Exercise/Treatments Stretches Active Hamstring Stretch: 2 reps;30 seconds;Limitations Active Hamstring Stretch Limitations: nerve glides x10 reps Piriformis Stretch: 1 rep;30 seconds;Limitations Piriformis Stretch Limitations: Supine Supine Bridge: 10 reps    Physical Therapy Assessment and Plan PT Assessment and Plan Clinical Impression Statement: Pt is a 62 year old female referred to PT for lumbar stabalization with following impairments below.  recently d/c from PT after Rt ankle sprain from a fall and is doing well and  is now referred back for neuropathic pain to her knee also from her fall.   Pt has significant muscle spasms and fascial restrictions to Rt gluteal region which may  be causing fascial restrictions of her nerve Pt will benefit from skilled therapeutic intervention in order to improve on the following deficits: Improper body mechanics;Obesity;Pain;Decreased strength;Decreased coordination;Increased fascial restricitons (radiculopathy) Rehab Potential: Good PT Frequency: Min 2X/week PT Duration: 4 weeks PT Treatment/Interventions: Functional mobility training;Therapeutic activities;Therapeutic exercise;Neuromuscular re-education;Patient/family education;Manual techniques;Modalities   PT Plan: squats, heel and toe raises, bridges, ab set, ab set with bent knee raise, piriformis stretching, RLE nerve glides manual techniques to Rt piriformis to decrease fascial restrictions. Continue to progress lumbar stab exercises to decrease Rt radicular pain to her knee.     Goals Home Exercise Program Pt will Perform Home Exercise Program: Independently PT Goal: Perform Home Exercise Program - Progress: Goal set today PT Short Term Goals Time to Complete Short Term Goals: 2 weeks PT Short Term Goal 1: Pt will improve her pain to less than 3/10 during the night time hours to her Rt knee PT Short Term Goal 2: Pt will present with minimal fascial restriction to her Rt piriformis and gluteal region.  PT Short Term Goal 3: Pt will require min cueing for proper core muscle activition in order to tolerate standing and walking for greater than 10 minutes without aide of a grocery cart to go shopping.  PT Long Term Goals Time to Complete Long Term Goals: 4 weeks PT Long Term Goal 1: Pt will improve her core strength in order to tolerate standing and walking independently x30 minutes to continue with grocery shopping activities.  PT Long Term Goal 2: Pt will improve her core strength in order to decrease radicular pain to her Rt knee.  Long Term Goal 3: Pt will present with fascial restrictions to lumbar and gluteal region in order for improved nerve mobility to decrease radicular  pain.  Long Term Goal 4: Pt will improve her ODI to less than 22% for improved percieved functional ability.   Problem List Patient Active Problem List   Diagnosis Date Noted  . Leg weakness 04/14/2013  . Back pain 04/14/2013  . Knee pain 04/14/2013  . Pain in joint, shoulder region 03/11/2013  . Muscle weakness (generalized) 03/11/2013  . Pain in joint, ankle and foot 03/11/2013  . Abnormality of gait 03/11/2013  . Right ankle sprain 03/02/2013  . Shoulder contusion 03/02/2013  . Constipation 06/22/2012  . Colon cancer screening 06/22/2012  . Pain in soft tissues of limb 07/05/2009  . FLANK PAIN, RIGHT 04/04/2009  . ELECTROCARDIOGRAM, ABNORMAL 02/20/2009  . DIABETES MELLITUS, WITH NEUROLOGICAL COMPLICATIONS [id number]  . HYPERTENSION 10/13/2008  . OSTEOARTHRITIS 10/13/2008  . LOW BACK PAIN 10/13/2008    General Behavior During Therapy: [facility] for tasks assessed/performed Cognition: [facility] for tasks performed PT Plan of Care PT Home Exercise Plan: see scanned repot PT Patient Instructions: teach back for posture, nerve glides and importance of core strength Consulted and Agree with Plan of Care: Patient  GP Functional Assessment Tool Used: ODI: 30% Functional Limitation: Self care Self Care Current Status [id number]): At least 20 percent but less than 40 percent impaired, limited or restricted Self Care Goal Status [id number]): At least 1 percent but less than 20 percent impaired, limited or restricted  Joseandres Mazer, MPT, ATC 04/19/2013, 10:23 AM  Physician Documentation Your signature is required to indicate approval of the treatment plan as stated above.  Please sign and either  send electronically or make a copy of this report for your files and return this physician signed original.   Please mark one 1.__approve of plan  2. ___approve of plan with the following conditions.   ______________________________                                                           _____________________ Physician Signature                                                                                                             Date

## 2013-04-20 ENCOUNTER — Encounter (HOSPITAL_COMMUNITY): Payer: Self-pay | Admitting: Dietician

## 2013-04-20 NOTE — Progress Notes (Signed)
Follow-Up Outpatient Nutrition Note Date: 04/20/2013 Appt StartTime: [id number]  Nutrition Assessment:  Current weight: Weight: 303 lb (137.44 kg)  Body mass index is 53.69 kg/(m^2). Weight changes: -1# (0%) x 1 month  Ms. Yando has maintained her weight. She has lost 1#. She reports that she is currently in outpatient physical therapy 2 times per week, due to knee injury.  She reports that she took a trip during [place] with her church and reported frequent eating out. She reports walking a little bit (mostly at stores or during every day activities) during her vacation, but she mostly took the bus, due to her knee injury. She reports she saw Dr. Criss Rosales after her trip and she told me she was able to maintain her weight during the trip. Since returning from her trip, exercise consists mostly from physical therapy appointments. Most of her physical activity consists of daily activities, such as shopping at [place]. Knee pain and hot weather are pt's largest barrier to physical activity. Pt has been baking and boiling meats and vegetables and has recently started eating fruit and salads for snacks. She eats out once a week and continues to choose high calorie beverages and side items for her meals. Desserts are also frequently ordered.  She reports she has been drinking more water lately, bringing a cup with her when she does errands outside of her home.   Diet recall: Breakfast: oatmeal, coffee, juice; Lunch: juice, pack of nabs OR chinese food OR cheeseburger, fries, cheery coke, apple turnover OR rice, string beans, fried chicken, biscuit, sweet tea; Dinner: bbq chicken, string beans, potatoes; HS snack: fruit OR [place] sandwich   Nutrition Diagnosis: Obesity r/t excessive energy intake and limited physical activity continues  Nutrition Intervention: Education/ counseling provided: Reinforced importance of physical activity to assist with weight loss and maintenance; emphasized importance of doing  physical activity outside of daily routine to better achieve goals. Discussed doing activities in the morning or evening hours, when the temperatures are less extreme. Emphasized importance of resuming physical activity as tolerated, within confines of activity recommendations. Also discussed specific examples of ways to improve her diet. Had long discussion with patient about limiting high calorie beverages in her diet. Suggested lower calorie beverage alternatives, such as water, crystal light, flavored water, or water with fruit slices. Also discussed implications of frequent fast food consumption. Discussed healthier options and choices at restaurants, such as savng half portion of food for a later meal, choosing a side salad or vegetables instead of fries, and drinking water with meals instead of tea, lemonade, and soda.Teach back method used.   Understanding/Motivation/ Ability to follow recommendations: Expect fair compliance.   Monitoring and Evaluation: Previous goals:  1) 1-2# weight loss per week- progressing; 2) 10 minutes physical activity 5 times per week- progressing  Goals for next visit: 1) 1-2# weight loss per week; 2) 10 minutes physical activity 5 times per week   Recommendations: 1) Choose water as beverage most often; 2) Keep food diary; 3) Do not snack or eat in front of the TV; 4) Choose fruits or vegetables as snacks; 5) Engage in outdoor physical activity in early AM or late PM, when it is not as hot  F/U: 6-8 weeks. Follow-up appointment scheduled for Tuesday, 05/31/13 at 9:00 AM.   [name], RD, LDN Date: 04/20/2013 Appt EndTime: [id number]

## 2013-04-21 ENCOUNTER — Ambulatory Visit (HOSPITAL_COMMUNITY)
Admission: RE | Admit: 2013-04-21 | Discharge: 2013-04-21 | Disposition: A | Discharge status: Home / Self Care | Payer: Medicare Other | Source: Ambulatory Visit | Attending: Orthopedic Surgery | Admitting: Orthopedic Surgery

## 2013-04-21 NOTE — Progress Notes (Signed)
Physical Therapy Treatment Patient Details  Name: Allison Gould MRN: [id number] Date of Birth: [birth year]  Today's Date: 04/21/2013 Time: 0931-1014 PT Time Calculation (min): 43 min  Visit#: 2 of 8  Re-eval: 05/19/13 Authorization: [facility] Medicare  Authorization Visit#: 2 of 10  Charges:  therex 26' 9:32-9:58, manual 14' 10:00-10:14  Subjective: Symptoms/Limitations Symptoms: Pt states her back is not hurting that bad today, but her knee is around 6/10.  States no radiating pain presently.  States most pain occurs when she lays down at night and wakes mid way through with pain.   Pain Assessment Currently in Pain?: Yes Pain Score:   6 Pain Location: Knee Pain Orientation: Right   Exercise/Treatments Stretches Active Hamstring Stretch: 2 reps;30 seconds;Limitations Active Hamstring Stretch Limitations: nerve glides 5 reps with RLE Piriformis Stretch: 2 reps;30 seconds Piriformis Stretch Limitations: supine, PROM RLE Supine Ab Set: 10 reps;5 seconds Clam: 10 reps Bent Knee Raise: 10 reps Bridge: 10 reps    Manual Therapy Manual Therapy: Other (comment) Massage: DTM and myofascial techniques Prone lying to Rt lower lumbar and glute region to decrease adhesions/pain; Rt LE sciatic nerve glides and piriformis stretching in supine  Physical Therapy Assessment and Plan PT Assessment and Plan Clinical Impression Statement: Added abdominal sets, clams and bent knee raise to increase lumbar strength and stability.  Multiple muscle tightness/spasms in Rt gluteal region decreased 80% with massage and myofascial techniques.  Pt reported painfree after session. Pt will benefit from skilled therapeutic intervention in order to improve on the following deficits: Improper body mechanics;Obesity;Pain;Decreased strength;Decreased coordination;Increased fascial restricitons (radiculopathy) PT Plan: Add squats, heel and toe raises next visit. continue manual techniques to decrease adhesions and  pain.     Problem List Patient Active Problem List   Diagnosis Date Noted  . Leg weakness 04/14/2013  . Back pain 04/14/2013  . Knee pain 04/14/2013  . Pain in joint, shoulder region 03/11/2013  . Muscle weakness (generalized) 03/11/2013  . Pain in joint, ankle and foot 03/11/2013  . Abnormality of gait 03/11/2013  . Right ankle sprain 03/02/2013  . Shoulder contusion 03/02/2013  . Constipation 06/22/2012  . Colon cancer screening 06/22/2012  . Pain in soft tissues of limb 07/05/2009  . FLANK PAIN, RIGHT 04/04/2009  . ELECTROCARDIOGRAM, ABNORMAL 02/20/2009  . DIABETES MELLITUS, WITH NEUROLOGICAL COMPLICATIONS [id number]  . HYPERTENSION 10/13/2008  . OSTEOARTHRITIS 10/13/2008  . LOW BACK PAIN 10/13/2008    General Behavior During Therapy: [facility] for tasks assessed/performed Cognition: [facility] for tasks performed   Teena Irani, PTA/CLT 04/21/2013, 10:20 AM

## 2013-04-26 ENCOUNTER — Inpatient Hospital Stay (HOSPITAL_COMMUNITY): Admission: RE | Admit: 2013-04-26 | Payer: Medicare Other | Source: Ambulatory Visit

## 2013-04-28 ENCOUNTER — Ambulatory Visit (HOSPITAL_COMMUNITY)
Admission: RE | Admit: 2013-04-28 | Discharge: 2013-04-28 | Disposition: A | Discharge status: Home / Self Care | Payer: Medicare Other | Source: Ambulatory Visit | Attending: Orthopedic Surgery | Admitting: Orthopedic Surgery

## 2013-04-28 NOTE — Progress Notes (Signed)
Physical Therapy Treatment Patient Details  Name: Allison Gould MRN: [id number] Date of Birth: 1951/09/01  Today's Date: 04/28/2013 Time: 0933-1001 PT Time Calculation (min): 28 min Charge: TE [id number], Manual [id number]  Visit#: 3 of 8  Re-eval: 05/19/13 Assessment Diagnosis: Knee pain/lumbar stabilization Next MD Visit: Dr. Aline Brochure  Authorization: [facility] Medicare  Authorization Time Period:    Authorization Visit#: 3 of 10   Subjective: Symptoms/Limitations Symptoms: Pain increases at night, pt c/o pain in Rt knee and Rt side LBP pain scle 6/10 today Pain Assessment Currently in Pain?: Yes Pain Score:   6 Pain Location: Knee Pain Orientation: Right  Precautions/Restrictions  Precautions Precautions: None  Exercise/Treatments Stretches ITB Stretch: 2 reps;30 seconds Standing Heel Raises: 10 reps;Limitations Heel Raises Limitations: heel and toe raises 10x Functional Squats: 10 reps;3 seconds  Manual Therapy Manual Therapy: Massage Massage: Myofascial release and DTM in prone position to Rt lower lumbar, hamstrings/glut region, MFR to iliotibial band to reduce fascial restrictions and decrease adhesions/pain.  Physical Therapy Assessment and Plan PT Assessment and Plan Clinical Impression Statement: Progressed to standing exercises with min cueing for technique and form, pt able to complete with min difficulty. Noted multiple spasms to right lumbar region and myofascial restrictions to iliotibial band, able to reduce by 50% with manual MFR and instructued pt the IT band stretch. Pt reported decreased pain end of session following manual PT Plan: Resume supine core strengthening exericses and progress towards goals.  Continue manual techniques to decrease adhesions and pain.    Goals    Problem List Patient Active Problem List   Diagnosis Date Noted  . Leg weakness 04/14/2013  . Back pain 04/14/2013  . Knee pain 04/14/2013  . Pain in joint, shoulder region  03/11/2013  . Muscle weakness (generalized) 03/11/2013  . Pain in joint, ankle and foot 03/11/2013  . Abnormality of gait 03/11/2013  . Right ankle sprain 03/02/2013  . Shoulder contusion 03/02/2013  . Constipation 06/22/2012  . Colon cancer screening 06/22/2012  . Pain in soft tissues of limb 07/05/2009  . FLANK PAIN, RIGHT 04/04/2009  . ELECTROCARDIOGRAM, ABNORMAL 02/20/2009  . DIABETES MELLITUS, WITH NEUROLOGICAL COMPLICATIONS [id number]  . HYPERTENSION 10/13/2008  . OSTEOARTHRITIS 10/13/2008  . LOW BACK PAIN 10/13/2008    PT - End of Session Activity Tolerance: Patient tolerated treatment well General Behavior During Therapy: WFL for tasks assessed/performed Cognition: WFL for tasks performed  GP    Aldona Lento 04/28/2013, 2:49 PM

## 2013-05-03 ENCOUNTER — Ambulatory Visit (HOSPITAL_COMMUNITY)
Admission: RE | Admit: 2013-05-03 | Discharge: 2013-05-03 | Disposition: A | Discharge status: Home / Self Care | Payer: Medicare Other | Source: Ambulatory Visit | Attending: Orthopedic Surgery | Admitting: Orthopedic Surgery

## 2013-05-03 DIAGNOSIS — M25619 Stiffness of unspecified shoulder, not elsewhere classified: Secondary | ICD-10-CM | POA: Insufficient documentation

## 2013-05-03 DIAGNOSIS — M25519 Pain in unspecified shoulder: Secondary | ICD-10-CM | POA: Insufficient documentation

## 2013-05-03 DIAGNOSIS — IMO0001 Reserved for inherently not codable concepts without codable children: Secondary | ICD-10-CM | POA: Insufficient documentation

## 2013-05-03 DIAGNOSIS — M6281 Muscle weakness (generalized): Secondary | ICD-10-CM | POA: Insufficient documentation

## 2013-05-03 NOTE — Progress Notes (Signed)
Physical Therapy Treatment Patient Details  Name: Allison Gould MRN: [id number] Date of Birth: 1951/05/22  Today's Date: 05/03/2013 Time: [id number] PT Time Calculation (min): 52 min Charge: TE 27' 0933-0959; Manual 13' 08-1012, Ice 1015-1025  Visit#: 4 of 8  Re-eval: 05/19/13    Authorization: [facility] Medicare  Authorization Time Period:    Authorization Visit#: 4 of 10   Subjective: Symptoms/Limitations Symptoms: No back pain today, have been on feet alot.  Pain Rt knee 4/10 Pain Assessment Currently in Pain?: Yes Pain Score:   4 Pain Location: Knee Pain Orientation: Right  Objective:  Exercise/Treatments Stretches ITB Stretch: 2 reps;30 seconds;Limitations ITB Stretch Limitations: standing Standing Heel Raises: 10 reps;Limitations Heel Raises Limitations: heel and toe raises 10x Functional Squats: 10 reps;3 seconds Supine Ab Set: 10 reps;5 seconds Bent Knee Raise: 10 reps;5 seconds Bridge: 10 reps;Limitations Bridge Limitations: ball between knees Sidelying Clam: 5 reps;Limitations Clam Limitations: 10 second hold Hip Abduction: 10 reps Other Sidelying Lumbar Exercises: hip adduction 10x  Modalities Modalities: Cryotherapy Manual Therapy Myofascial Release: SUPINE: MFR to Rt quad and anterior thigh to reduce fascial restrictions 08-1012 Cryotherapy Number Minutes Cryotherapy: 10 Minutes Cryotherapy Location: Knee Type of Cryotherapy: Ice pack  Physical Therapy Assessment and Plan PT Assessment and Plan Clinical Impression Statement: Progressed therex exercises for glut med and adductor strengthening with min cueing required for form.  Manual techniques to reduce multiple fascial restrictions Rt quad, anterior knee and IT band. Pt instructed standing IT band stretch. PT Plan: Continue with current POC.  Continue manual techniques to decrease adhesions and reduce pain.    Goals    Problem List Patient Active Problem List   Diagnosis Date Noted  . Leg  weakness 04/14/2013  . Back pain 04/14/2013  . Knee pain 04/14/2013  . Pain in joint, shoulder region 03/11/2013  . Muscle weakness (generalized) 03/11/2013  . Pain in joint, ankle and foot 03/11/2013  . Abnormality of gait 03/11/2013  . Right ankle sprain 03/02/2013  . Shoulder contusion 03/02/2013  . Constipation 06/22/2012  . Colon cancer screening 06/22/2012  . Pain in soft tissues of limb 07/05/2009  . FLANK PAIN, RIGHT 04/04/2009  . ELECTROCARDIOGRAM, ABNORMAL 02/20/2009  . DIABETES MELLITUS, WITH NEUROLOGICAL COMPLICATIONS [id number]  . HYPERTENSION 10/13/2008  . OSTEOARTHRITIS 10/13/2008  . LOW BACK PAIN 10/13/2008    PT - End of Session Activity Tolerance: Patient tolerated treatment well General Behavior During Therapy: WFL for tasks assessed/performed Cognition: WFL for tasks performed  GP    Aldona Lento 05/03/2013, 10:24 AM

## 2013-05-05 ENCOUNTER — Ambulatory Visit (HOSPITAL_COMMUNITY)
Admission: RE | Admit: 2013-05-05 | Discharge: 2013-05-05 | Disposition: A | Discharge status: Home / Self Care | Payer: Medicare Other | Source: Ambulatory Visit

## 2013-05-05 NOTE — Progress Notes (Signed)
Physical Therapy Treatment Patient Details  Name: Allison Gould MRN: [id number] Date of Birth: Aug 10, 1951  Today's Date: 05/05/2013 Time: [id number] PT Time Calculation (min): 39 min Charge : TE 25' 0933-0958, Manual 14' 0958-1012  Visit#: 5 of 8  Re-eval: 05/19/13 Assessment Diagnosis: Knee pain/lumbar stabilization Next MD Visit: Dr. Aline Brochure  Authorization: [facility] Medicare  Authorization Time Period:    Authorization Visit#: 5 of 10   Subjective: Symptoms/Limitations Symptoms: Pt reports LBP minimum, Rt knee is feeling better today.  Pt stated great difficulty with prolonged standing and washing dishes Pain Assessment Currently in Pain?: Yes Pain Score: 4  Pain Location: Back Pain Orientation: Lower  Precautions/Restrictions  Precautions Precautions: None  Exercise/Treatments Stretches Piriformis Stretch: 2 reps;30 seconds Piriformis Stretch Limitations: supine  Standing Heel Raises: Limitations Heel Raises Limitations: heel and toe walking 1RT Functional Squats: 15 reps Scapular Retraction: 10 reps;Theraband Theraband Level (Scapular Retraction): Level 3 (Green) Row: 10 reps;Theraband Theraband Level (Row): Level 3 (Green) Shoulder Extension: 10 reps;Theraband Theraband Level (Shoulder Extension): Level 3 (Green) Shoulder ADduction: Both;10 reps;Theraband Theraband Level (Shoulder Adduction): Level 3 (Green) Other Standing Lumbar Exercises:   Prone  Straight Leg Raise: 5 reps Other Prone Lumbar Exercises: heel squeeze 5x 5"  Manual Therapy Manual Therapy: Massage Massage: Prone MFR and DTM to Lumbar region, gluteal R>L and hamstrings. MFR to IT band and posterior knee [id number]  Physical Therapy Assessment and Plan PT Assessment and Plan Clinical Impression Statement: Session focus on postural strengthening and improveing activty tolerance with standing activities.  Pt able to complete 15 minutes worth of strengthening exercises with no rest break required.   Added prone postural strenghtening exercises.  Manual MFR to DTM complete to reduce fascial restrictions for improved lumbar mobiltiy and pain reduced.   PT Plan: Continue with current POC.  Continue manual techniques to decrease adhesions and reduce pain.    Goals    Problem List Patient Active Problem List   Diagnosis Date Noted  . Leg weakness 04/14/2013  . Back pain 04/14/2013  . Knee pain 04/14/2013  . Pain in joint, shoulder region 03/11/2013  . Muscle weakness (generalized) 03/11/2013  . Pain in joint, ankle and foot 03/11/2013  . Abnormality of gait 03/11/2013  . Right ankle sprain 03/02/2013  . Shoulder contusion 03/02/2013  . Constipation 06/22/2012  . Colon cancer screening 06/22/2012  . Pain in soft tissues of limb 07/05/2009  . FLANK PAIN, RIGHT 04/04/2009  . ELECTROCARDIOGRAM, ABNORMAL 02/20/2009  . DIABETES MELLITUS, WITH NEUROLOGICAL COMPLICATIONS [id number]  . HYPERTENSION 10/13/2008  . OSTEOARTHRITIS 10/13/2008  . LOW BACK PAIN 10/13/2008    PT - End of Session Activity Tolerance: Patient tolerated treatment well General Behavior During Therapy: WFL for tasks assessed/performed Cognition: WFL for tasks performed  GP    Aldona Lento 05/05/2013, 6:55 PM

## 2013-05-10 ENCOUNTER — Ambulatory Visit (HOSPITAL_COMMUNITY)
Admission: RE | Admit: 2013-05-10 | Discharge: 2013-05-10 | Disposition: A | Discharge status: Home / Self Care | Payer: Medicare Other | Source: Ambulatory Visit | Attending: Orthopedic Surgery | Admitting: Orthopedic Surgery

## 2013-05-10 NOTE — Progress Notes (Signed)
Physical Therapy Treatment Patient Details  Name: Allison Gould MRN: [id number] Date of Birth: 1951-08-08  Today's Date: 05/10/2013 Time: 0937-1028 PT Time Calculation (min): 51 min Visit#: 6 of 8  Re-eval: 05/19/13 Authorization: [employer] Medicare  Authorization Visit#: 6 of 10  Charges:  therex [id number] (26'), manual 1005-1015 (10'), MHP 1016-1027 (10')  Subjective: Symptoms/Limitations Symptoms: Pt states knee is just a little uncomfortable but no real pain.  States her lumbar region is hurting worse today, no specific event that increased pain.   6/10 pain rating today. Pain Assessment Currently in Pain?: Yes Pain Score: 6  Pain Location: Back Pain Orientation: Lower   Exercise/Treatments Standing Heel Raises: Limitations Heel Raises Limitations: heel and toe walking 2RT Functional Squats: 15 reps Scapular Retraction: 15 reps;Theraband Theraband Level (Scapular Retraction): Level 3 (Green) Row: 15 reps;Theraband Theraband Level (Row): Level 3 (Green) Shoulder Extension: 15 reps;Theraband Theraband Level (Shoulder Extension): Level 3 (Green) Supine Ab Set: 10 reps;5 seconds Bent Knee Raise: 10 reps;5 seconds Bridge: 10 reps;Limitations Bridge Limitations: ball between knees Sidelying Clam: 5 reps;Limitations Clam Limitations: 10 second hold bilaterally Hip Abduction: 10 reps Other Sidelying Lumbar Exercises: hip adduction 10x    Modalities  Manual Therapy:  Other Manual Therapy: Prone MFR and DTM to central lower lumbar region and glutes f/b MHP in prone  Moist Heat Therapy Number Minutes Moist Heat: 10 Minutes Moist Heat Location: lumbar in prone lying  Physical Therapy Assessment and Plan PT Assessment and Plan Clinical Impression Statement: Focused session on lumbar region today.  Good form with theraband activities withouit c/o painl  Pt able to complete all other mat activities without difficulty.  No spasms palpated with manual techniques just minimal fascial  restrictions.  Added MHP at end of session to increase muscle relaxation and decrease pain. PT Plan: Continue with current POC.  Continue manual techniques to decrease adhesions and reduce pain.     Problem List Patient Active Problem List   Diagnosis Date Noted  . Leg weakness 04/14/2013  . Back pain 04/14/2013  . Knee pain 04/14/2013  . Pain in joint, shoulder region 03/11/2013  . Muscle weakness (generalized) 03/11/2013  . Pain in joint, ankle and foot 03/11/2013  . Abnormality of gait 03/11/2013  . Right ankle sprain 03/02/2013  . Shoulder contusion 03/02/2013  . Constipation 06/22/2012  . Colon cancer screening 06/22/2012  . Pain in soft tissues of limb 07/05/2009  . FLANK PAIN, RIGHT 04/04/2009  . ELECTROCARDIOGRAM, ABNORMAL 02/20/2009  . DIABETES MELLITUS, WITH NEUROLOGICAL COMPLICATIONS [id number]  . HYPERTENSION 10/13/2008  . OSTEOARTHRITIS 10/13/2008  . LOW BACK PAIN 10/13/2008    PT - End of Session Activity Tolerance: Patient tolerated treatment well General Behavior During Therapy: [facility] for tasks assessed/performed Cognition: WFL for tasks performed      Teena Irani, PTA/CLT 05/10/2013, 10:18 AM

## 2013-05-12 ENCOUNTER — Ambulatory Visit (HOSPITAL_COMMUNITY)
Admission: RE | Admit: 2013-05-12 | Discharge: 2013-05-12 | Disposition: A | Discharge status: Home / Self Care | Payer: Medicare Other | Source: Ambulatory Visit | Attending: Orthopedic Surgery | Admitting: Orthopedic Surgery

## 2013-05-12 NOTE — Progress Notes (Signed)
Physical Therapy Treatment Patient Details  Name: Allison Gould MRN: [id number] Date of Birth: [birth year]  Today's Date: 05/12/2013 Time: [id number] (955 with/ PT; [phone] w/PTT (WT)) PT Time Calculation (min): 54 min Charges: TE: [id number] No charge: [id number] Visit#: 7 of 8  Re-eval: 05/19/13    Authorization: [facility] Medicare  Authorization Time Period:    Authorization Visit#: 7 of 10   Subjective: Symptoms/Limitations Symptoms: Pt reports that she is not able to do her exercises because she is too busy.  Reports her back is feeling better.  Pain Assessment Currently in Pain?: Yes Pain Score: 6  Pain Location: Back Pain Orientation: Lower Pain Type: Neuropathic pain  Precautions/Restrictions     Exercise/Treatments Aerobic Tread Mill: 10 min 1.0 mph for posture Standing Heel Raises: Limitations Heel Raises Limitations: heel and toe walking 2RT Functional Squats: 15 reps Scapular Retraction: 15 reps;Theraband Theraband Level (Scapular Retraction): Level 3 (Green) Row: 15 reps;Theraband Theraband Level (Row): Level 3 (Green) Shoulder Extension: 15 reps;Theraband Theraband Level (Shoulder Extension): Level 3 (Green) Shoulder ADduction: Both;Theraband;15 reps Theraband Level (Shoulder Adduction): Level 3 (Green) Supine Ab Set: 10 reps;5 seconds Bent Knee Raise: 10 reps;5 seconds Bridge: 10 reps;Limitations Bridge Limitations: ball between knees Sidelying Clam: 5 reps;Limitations Clam Limitations: 10 second hold bilaterally Hip Abduction: 10 reps Other Sidelying Lumbar Exercises: hip adduction 10x  Physical Therapy Assessment and Plan PT Assessment and Plan Clinical Impression Statement: Pt continues to need cueing for HEP activities.  Has improved posture and TrA contraction with walking and standing activities.  Has decreased pain at end of the sesion 3/10.  continued to encourage completing her HEP.   PT Plan: Re-eval, provide with green theraband for home.      Goals    Problem List Patient Active Problem List   Diagnosis Date Noted  . Leg weakness 04/14/2013  . Back pain 04/14/2013  . Knee pain 04/14/2013  . Pain in joint, shoulder region 03/11/2013  . Muscle weakness (generalized) 03/11/2013  . Pain in joint, ankle and foot 03/11/2013  . Abnormality of gait 03/11/2013  . Right ankle sprain 03/02/2013  . Shoulder contusion 03/02/2013  . Constipation 06/22/2012  . Colon cancer screening 06/22/2012  . Pain in soft tissues of limb 07/05/2009  . FLANK PAIN, RIGHT 04/04/2009  . ELECTROCARDIOGRAM, ABNORMAL 02/20/2009  . DIABETES MELLITUS, WITH NEUROLOGICAL COMPLICATIONS [id number]  . HYPERTENSION 10/13/2008  . OSTEOARTHRITIS 10/13/2008  . LOW BACK PAIN 10/13/2008    PT - End of Session Activity Tolerance: Patient tolerated treatment well General Behavior During Therapy: WFL for tasks assessed/performed Cognition: WFL for tasks performed  GP    Iliyana Convey, MPT, ATC 05/12/2013, 11:18 AM

## 2013-05-16 ENCOUNTER — Ambulatory Visit (HOSPITAL_COMMUNITY)
Admission: RE | Admit: 2013-05-16 | Discharge: 2013-05-16 | Disposition: A | Discharge status: Home / Self Care | Payer: Medicare Other | Source: Ambulatory Visit

## 2013-05-16 NOTE — Progress Notes (Addendum)
Physical Therapy Re-evaluation / Discharge  Patient Details  Name: Allison Gould MRN: [id number] Date of Birth: 1951-10-31  Today's Date: 05/16/2013 Time: 1300-1350 PT Time Calculation (min): 50 min        Visit#: 8 of 8  Re-eval: 05/19/13 Diagnosis: Knee pain/lumbar stabilization Next MD Visit: Dr. Aline Brochure Authorization: [facility] Medicare    Authorization Visit#: 8 of 10  Charges:   TE 25' 1301-1326, PPT 1327-1340, MMT X 1   Subjective Pt states she feels she is doing much better; limited pain with only occasional radiating pain to posterior knee.   Sensation/Coordination/Flexibility/Functional Tests Functional Tests Functional Tests: Oswestry disability index (ODI): 17/50  Assessment RLE Strength Right Hip Flexion: 5/5 (was 4/5) Right Hip Extension: 4/5 (was 3+/5) Right Hip ABduction: 4/5 (was 4/5) Right Knee Flexion: 5/5 (was 5/5) Right Knee Extension: 5/5 (was 5/5)  Lumbar Assessment Lumbar Assessment: Within Functional Limits  Exercise/Treatments Aerobic Tread Mill: 10 min 1.0 mph for posture Standing Heel Raises: Limitations Heel Raises Limitations: heel and toe walking 2RT Functional Squats: 15 reps Scapular Retraction: 15 reps;Theraband Theraband Level (Scapular Retraction): Level 3 (Green) Row: 15 reps;Theraband Theraband Level (Row): Level 3 (Green) Shoulder Extension: 15 reps;Theraband Theraband Level (Shoulder Extension): Level 3 (Green) Shoulder ADduction: Both;Theraband;15 reps Theraband Level (Shoulder Adduction): Level 3 (Green)   Physical Therapy Assessment and Plan PT Assessment and Plan PT Assessment:  Pt has completed all scheduled therapy appointments.  Pt has progressed well toward goals and has met 1/3 STG, 1/4 LTG since beginning therapy.  Overall strength has improved with ODI of 34%.  Pt requests to be discharged from skilled therapy at this time.  Pt without questions regarding HEP and feels she will be able to continue to progress  independently.  Pt given tband and written instructions for postural exercises and was able to demonstrate correctly. PT Plan: Discharge to [facility] Pt/caregiver will Perform Home Exercise Program: Independently PT Goal: Perform Home Exercise Program - Progress: Met  PT Short Term Goals Time to Complete Short Term Goals: 2 weeks PT Short Term Goal 1: Pt will improve her pain to less than 3/10 during the night time hours to her Rt knee - Progress: Partly met PT Short Term Goal 2: Pt will present with minimal fascial restriction to her Rt piriformis and gluteal region. - Progress: Partly met PT Short Term Goal 3: Pt will require min cueing for proper core muscle activition in order to tolerate standing and walking for greater than 10 minutes without aide of a grocery cart to go shopping. - Progress: Met  PT Long Term Goals Time to Complete Long Term Goals: 4 weeks PT Long Term Goal 1: Pt will improve her core strength in order to tolerate standing and walking independently x30 minutes to continue with grocery shopping activities. - Progress: Met PT Long Term Goal 2: Pt will improve her core strength in order to decrease radicular pain to her Rt knee. - Progress: Partly met Long Term Goal 3: Pt will present with fascial restrictions to lumbar and gluteal region in order for improved nerve mobility to decrease radicular pain. -Progress: Partly met Long Term Goal 4: Pt will improve her ODI to less than 22% for improved percieved functional ability. -Progress: Not met  Problem List Patient Active Problem List   Diagnosis Date Noted  . Leg weakness 04/14/2013  . Back pain 04/14/2013  . Knee pain 04/14/2013  . Pain in joint, shoulder region 03/11/2013  . Muscle  weakness (generalized) 03/11/2013  . Pain in joint, ankle and foot 03/11/2013  . Abnormality of gait 03/11/2013  . Right ankle sprain 03/02/2013  . Shoulder contusion 03/02/2013  . Constipation 06/22/2012  .  Colon cancer screening 06/22/2012  . Pain in soft tissues of limb 07/05/2009  . FLANK PAIN, RIGHT 04/04/2009  . ELECTROCARDIOGRAM, ABNORMAL 02/20/2009  . DIABETES MELLITUS, WITH NEUROLOGICAL COMPLICATIONS [id number]  . HYPERTENSION 10/13/2008  . OSTEOARTHRITIS 10/13/2008  . LOW BACK PAIN 10/13/2008    PT - End of Session Activity Tolerance: Patient tolerated treatment well General Behavior During Therapy: WFL for tasks assessed/performed Cognition: WFL for tasks performed  GP Mobility: Walking and Moving Around Current Status [id number]): At least 20 percent but less than 40 percent impaired, limited or restricted Mobility: Walking and Moving Around Goal Status [phone]): At least 1 percent but less than 20 percent impaired, limited or restricted Self Care Current Status [id number]): At least 20 percent but less than 40 percent impaired, limited or restricted  Teena Irani, PTA/CLT 05/16/2013, 1:53 PM

## 2013-05-19 ENCOUNTER — Ambulatory Visit (HOSPITAL_COMMUNITY): Payer: Medicare Other

## 2013-05-24 ENCOUNTER — Ambulatory Visit (HOSPITAL_COMMUNITY): Payer: Medicare Other | Admitting: Physical Therapy

## 2013-05-26 ENCOUNTER — Ambulatory Visit (HOSPITAL_COMMUNITY): Payer: Medicare Other | Admitting: Physical Therapy

## 2013-05-31 ENCOUNTER — Encounter (HOSPITAL_COMMUNITY): Payer: Self-pay | Admitting: Dietician

## 2013-05-31 ENCOUNTER — Ambulatory Visit (HOSPITAL_COMMUNITY): Payer: Medicare Other | Admitting: Physical Therapy

## 2013-05-31 NOTE — Progress Notes (Signed)
Follow-Up Outpatient Nutrition Note Date: 05/31/2013 Appt StartTime: 0902  Nutrition Assessment:  Current weight: Weight: 304 lb (137.893 kg)  Body mass index is 53.86 kg/(m^2). Weight changes: +1# (0.3%) x 1 month  Allison Gould has maintained her weight. She has gained 1#. She reports that she has just finished physical therapy for a knee and back injury. She reports that physical therapy has allowed her to be more mobile, but admits that personal and family commitments do not allow her to be as active as she would like. She reports she has been busy and her family's dependence on her make it difficult to plan exercise time. Most of her physical activity consists of walking at stores while doing errands. She just renewed her [employer], and is contemplating resuming regular workouts there.  She reports that she has been "eating too much". She struggles with eating out and church fellowship meals. She admits to overindulging. She reports she likes to try all to food offerings, and puts small portions on her plates. She was proud that she did not go back for seconds at her most recent trip to [place]. She consumes fast food 2-3 times per month and admits to continuing to drink high calorie beverages daily. She often orders desserts when she is out as well.  CBG's run in the 80-100's, per pt report.   Diet recall: Breakfast: oatmeal OR scrambled eggs, bacon, toast; Lunch: sandwich OR salad from [employer], sweet tea; Dinner: boiled chicken, broccoli, white rice  Nutrition Diagnosis: Obesity r/t excessive energy intake and limited physical activity continues  Nutrition Intervention: Education/ counseling provided: Most of visit was spent on counseling to assist patient in making behavior changes in regards to diet and physical activity. Reinforced importance of physical activity to assist with weight loss and maintenance; emphasized importance of doing physical activity outside of daily routine  to better achieve goals. Discussed doing activities in the morning or evening hours, when the temperatures are less extreme. Emphasized importance of resuming physical activity as tolerated, within confines of activity recommendations. Discussed specific ways that pt could fit physical activity into her schedule.  Also discussed specific examples of ways to improve her diet. Had long discussion with patient about limiting high calorie beverages in her diet. Suggested lower calorie beverage alternatives, such as water, crystal light, flavored water, or water with fruit slices. Also discussed implications of frequent fast food consumption. Discussed healthier options and choices at restaurants, such as savng half portion of food for a later meal, choosing a side salad or vegetables instead of fries, and drinking water with meals instead of tea, lemonade, and soda.Teach back method used.   Understanding/Motivation/ Ability to follow recommendations: Expect fair compliance.   Monitoring and Evaluation: Previous goals:  1) 1-2# weight loss per week- progressing; 2) 10 minutes physical activity 5 times per week- progressing  Goals for next visit: 1) 1-2# weight loss per week; 2) 10 minutes physical activity 5 times per week   Recommendations: 1) Choose water as beverage most often; 2) Keep food diary; 3) Do not snack or eat in front of the TV; 4) Choose fruits or vegetables as snacks; 5) Engage in outdoor physical activity in early AM or late PM, when it is not as hot  F/U: 4-6 weeks. Follow-up appointment scheduled for Tuesday, 06/21/13 at 9:00 AM.   [name], RD, LDN Date: 05/31/2013 Appt EndTime: [id number]

## 2013-06-02 ENCOUNTER — Ambulatory Visit (HOSPITAL_COMMUNITY): Payer: Medicare Other

## 2013-06-21 ENCOUNTER — Telehealth (HOSPITAL_COMMUNITY): Payer: Self-pay | Admitting: Dietician

## 2013-06-21 ENCOUNTER — Encounter (HOSPITAL_COMMUNITY): Payer: Self-pay | Admitting: Dietician

## 2013-06-21 NOTE — Telephone Encounter (Signed)
Encounter created in error. Please disregard.

## 2013-06-21 NOTE — Progress Notes (Signed)
Follow-Up Outpatient Nutrition Note Date: 06/21/2013 Appt StartTime: [id number]  Nutrition Assessment:  Current weight: Weight: 303 lb (137.44 kg)  Body mass index is 53.69 kg/(m^2). Weight changes: -1# (0.3%) x 1 month  Allison Gould has maintained her weight. She has lost 1#. She reports she is surprised with weight loss, because "I've been eating a lot at night". She continues to struggle with food choices and limited physical activity. She reports she has not returned to the gym since last visit. She remains in the contemplative stage of change for exercise. Physical activity is limited to walking at several stores. She reports that it has been difficult to be active due to the rain. She states that she is going to start at the [facility] next week, when he nieces and nephews return to school. Goal to to exercise for 30 minutes 2-3 times per week. She reports it is difficult for her to exercise, due to her family commitments. She also complains that it is difficult to find a machine early in the morning.  Diet remains unchanged, other than increased snack intake. She eats 1-2 snacks per day, which mainly consist of calorie dense foods, such as potato chips and cracker jacks. She often finds herself eating in front of the TV late at night, due to boredom.   Diet recall: Breakfast: oatmeal OR soup, coffee (with cream and artificial sweetener), water, juice (16 oz); Snack: potato chips OR unsweetened applesauce OR nabs; Lunch: chicken, vegetables, potatoes OR chicken, biscuit, and tea from [employer];  Dinner: chicken, vegetables OR chicken noodle soup, gingerale; HS snack: potato chips or cracker jack.   Nutrition Diagnosis: Obesity r/t excessive energy intake and limited physical activity continues  Nutrition Intervention: Education/ counseling provided: Most of visit was spent on counseling to assist patient in making behavior changes in regards to diet and physical activity. Reinforced importance of physical  activity to assist with weight loss and maintenance; emphasized importance of doing physical activity outside of daily routine to better achieve goals. Discussed specific ways that pt could fit physical activity into her schedule. Discussed specific ways pt fit physical activity within her schedule, as well as avoiding peak times at the gym, identifying times that worked for her and times when the gym is less busy.  Also discussed specific examples of ways to improve her diet. Had long discussion with patient about limiting high calorie beverages in her diet. Suggested lower calorie beverage alternatives, such as water, crystal light, flavored water, or water with fruit slices. Discussed implications of excessive snacking; discussed healthier snack choices and encouraged eating only in an environment free of distractions. Educated pt on meal planning strategies and ways to plan menus throughout the week. Discussed buying portion plates and containers to help plan and eat proper portions.Teach back method used.   Understanding/Motivation/ Ability to follow recommendations: Expect fair compliance.   Monitoring and Evaluation: Previous goals:  1) 1-2# weight loss per week- progressing; 2) 10 minutes physical activity 5 times per week- goal not met  Goals for next visit: 1) 1-2# weight loss per week; 2) 30 minutes physical activity 3 times per week  Recommendations: 1) Choose water as beverage most often; 2) Keep food diary; 3) Do not snack or eat in front of the TV; 4) Choose fruits or vegetables as snacks; 5) Prepare meals in advance, use containers to help measure proper portions  F/U: 4-6 weeks. Follow-up appointment scheduled for Tuesday, 07/26/13 at 9:00 AM.   Natasha Paulson A. Jimmye Norman, RD, LDN Date:  06/21/2013 Appt EndTime: [id number]

## 2013-07-26 ENCOUNTER — Encounter (HOSPITAL_COMMUNITY): Payer: Self-pay | Admitting: Dietician

## 2013-07-26 NOTE — Progress Notes (Signed)
Follow-Up Outpatient Nutrition Note Date: 07/26/2013 Appt StartTime: [id number]  Nutrition Assessment:  Current weight: Weight: 304 lb (137.893 kg)  Body mass index is 53.86 kg/(m^2). Weight changes: +1# (0.3%) x 1 month  Allison Gould's weight and diet have unfortunately deteriorated. She reports that she has had difficulty adjust to changes in her life. For example, she got off her normal routine for being summoned to jury duty recently. Additionally, her son recently relocated to [place] last month from [place] and has been staying with her until he can find housing and employment.  Since her son started staying with her, she reports to eating out more. She states "I don't think that boy ever cooked because all he does is eat out". 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 She reports eating high calorie items such as biscuits and apple turnovers. She also reports to eating increased portions as she will often order a "full breakfast" which consists of hash browns, coffee, bacon, and eggs. She tries to order healthfully at lunch, usually a salad, but also admits to consuming value meals. She reports "eating more bread", but has been trying to choose whole wheat most often. Unfortunately, diet recall reveals that pt is likely consuming more refined grains than whole grains.  CBGs remain stable- pt reports in the 80-100's.  She is physically inactive. She has not been to the gym since last visit. She reports minimal activity, such as walking while grocery shopping.   Diet recall: Breakfast: cheese, sausage, and egg biscuit OR hashbrowns, coffee, eggs, bacon; Snack: potato chips OR unsweetened applesauce OR nabs; Lunch: grilled chicken salad OR cheeseburger;  Dinner: chicken biscuit OR baked chicken, string beans, candied yams, cornbread; HS snack: bowl of cheerios  Nutrition Diagnosis: Obesity r/t excessive energy intake  and limited physical activity continues  Nutrition Intervention: Education/ counseling provided: Reviewed diet and exercise recall. Had long discussion with pt regarding food choices. Discussed ways to to order more healthfully at restaurants. Discussed calorie content of commonly eaten restaurant items and low calorie alternatives. Discussed importance of refraining from mindless eating and gave examples of more appropriate snacks, but discouraged snacking, particularly at night. Counseled against eating in front of the TV. Discussed importance of being as active as possible to assist with weight control.Teach back method used.   Understanding/Motivation/ Ability to follow recommendations: Expect fair compliance.   Monitoring and Evaluation: Previous goals:  1) 1-2# weight loss per week- deteriorated; 2) 10 minutes physical activity 5 times per week- goal not met  Goals for next visit: 1) 1-2# weight loss per week; 2) 20 minutes physical activity 3 times per week  Recommendations: 1) Choose water as beverage most often; 2) Choose fruits or vegetables as snacks; 3)  When eating out, choose a la carte items or dollar menu items instead of value meal; 4) Research menu options prior to going out to eat; 5) Consume a salad or soup with meal to decrease food portions  F/U: 4-6 weeks. Follow-up appointment scheduled for Tuesday, 08/30/13 at 9:00 AM.   Allison Gould, RD, LDN Date: 07/26/2013 Appt EndTime: [id number]

## 2013-08-30 ENCOUNTER — Encounter (HOSPITAL_COMMUNITY): Payer: Self-pay | Admitting: Dietician

## 2013-08-30 NOTE — Progress Notes (Signed)
Follow-Up Outpatient Nutrition Note Date: 08/30/2013 Appt StartTime: 0904  Nutrition Assessment:  Current weight: Weight: 304 lb (137.893 kg)  Body mass index is 53.86 kg/(m^2). Weight changes: 0# (0%) x 1 month  Allison Gould has maintained her weight. She reports "I though I gained; I was 307 when I went to see Dr. Criss Rosales".  Unfortunately, physical activity continues to be an issue. Physical activity is minimal beyond her normal routine. She has not been to the [facility] since last visit. She reports most of her activities include walking in [place] or the grocery store or occasionally walking down the driveways at her sister's home with her nieces and nephews.  She reports she saw Dr. Criss Rosales last month and her bloodwork has normal. CBGs remain stable at 90-100.  She has made a few positive changes to her diet, including making meats in the crock pot and incorporating more vegetables such as peppers, onions, and carrots. Per her reports, meats are lean chicken and [place].  Unfortunately, eating and and beverage choices continue to be an issue. She goes out for fast food about once a week, either at a [place] [employer] (where she drinks sweet tea or a regular soda) or a burger joint where she mainly eat a combo meal (large burger and large fry), although she is contemplating getting salads there more often. She also admits to drinking large amounts of juice and sweet tea.    Diet recall: Breakfast: cheese, sausage, and egg biscuit OR hashbrowns, coffee, eggs, bacon; Snack: potato chips OR unsweetened applesauce OR nabs; Lunch: grilled chicken salad OR cheeseburger;  Dinner: chicken biscuit OR baked chicken, string beans, candied yams, cornbread; HS snack: bowl of cheerios  Nutrition Diagnosis: Obesity r/t excessive energy intake and limited physical activity continues  Nutrition Intervention: Education/ counseling provided: Reviewed diet and exercise recall. Had long discussion with pt regarding food choices.  Discussed ways to to order more healthfully at restaurants. Discussed calorie content of commonly eaten restaurant items and low calorie alternatives. Discussed importance of refraining from mindless eating and gave examples of more appropriate snacks, but discouraged snacking, particularly at night. Counseled against eating in front of the TV. Also discussed low calorie beverage options. Discussed importance of being as active as possible to assist with weight control.Teach back method used.   Understanding/Motivation/ Ability to follow recommendations: Expect fair compliance.   Monitoring and Evaluation: Previous goals:  1) 1-2# weight loss per week- progressng; 2) 20 minutes physical activity 5 times per week- progressing  Goals for next visit: 1) 1-2# weight loss per week; 2) 20 minutes physical activity 3 times per week  Recommendations: 1) Choose water or low calorie beverages (flavored water, unsweetened tea with artificial sweetener) as beverage most often; 2) Choose fruits or vegetables as snacks; 3)  When eating out, choose a la carte items or dollar menu items instead of value meal; 4)Consume a salad or soup with meal to decrease food portions  F/U: 4-8 weeks. Follow-up appointment scheduled for Tuesday, 10/04/13 at 9:00 AM.   Sparkles Mcneely A. Jimmye Norman, RD, LDN Date: 08/30/2013 Appt EndTime: [phone]

## 2013-10-04 ENCOUNTER — Encounter (HOSPITAL_COMMUNITY): Payer: Self-pay | Admitting: Dietician

## 2013-10-04 NOTE — Progress Notes (Signed)
Follow-Up Outpatient Nutrition Note Date: 10/04/2013  Appt StartTime: 0904  Nutrition Assessment:  Current weight: Weight: 303 lb (137.44 kg)  Body mass index is 53.69 kg/(m^2). Weight changes: -1# (0.3%) x 1 month  Allison Gould continues to maintain her weight. She has lost -1# since last visit, which she is delighted about due to the holidays.  She reports that she has been struggling with SOB and this has affected her physical activity. She reports she currently uses an inhaler. She reports "my breath will give out on me when I walk". She recollects having to stop multiple times to catch her breath in the [employer] parking lot a few weeks ago. She reports her breathing has improved in the past few weeks due to inhaler use.  She reports decreased physical activity due to breathing issues. She still walks at [place] and parks her car in the far corner of the parking lot when running errands. However, she has not gone to the gym in the past several visits.  She reports she has decreased eating out and generally goes about once a week, however, suspect some underreporting. She reports splitting french fries with value meal or ordering a salad. She continues to consume high calorie beverages, such as tea or soda when eating out.    Diet recall: Breakfast: cheese, sausage, and egg biscuit OR hashbrowns, coffee, eggs, bacon; Snack: potato chips OR unsweetened applesauce OR nabs; Lunch: grilled chicken salad OR cheeseburger;  Dinner: chicken biscuit OR baked chicken, string beans, candied yams, cornbread; HS snack: bowl of cheerios  Nutrition Diagnosis: Obesity r/t excessive energy intake and limited physical activity continues  Nutrition Intervention: Education/ counseling provided: Reviewed diet and exercise recall. Had long discussion with pt regarding food choices. Discussed ways to to order more healthfully at restaurants. Discussed calorie content of commonly eaten restaurant items and low calorie  alternatives. Discussed importance of refraining from mindless eating and gave examples of more appropriate snacks, but discouraged snacking, particularly at night. Counseled against eating in front of the TV. Also discussed low calorie beverage options. Discussed importance of being as active as possible to assist with weight control.Teach back method used.   Understanding/Motivation/ Ability to follow recommendations: Expect fair compliance.   Monitoring and Evaluation: Previous goals:  1) 1-2# weight loss per week- progressing; 2) 20 minutes physical activity 3 times per week- progressing  Goals for next visit: 1) 1-2# weight loss per week; 2) 20 minutes physical activity 3 times per week  Recommendations: 1) Choose water or low calorie beverages (flavored water, unsweetened tea with artificial sweetener) as beverage most often; 2) Choose fruits or vegetables as snacks; 3)  When eating out, choose a la carte items or dollar menu items instead of value meal; 4)Consume a salad or soup with meal to decrease food portions  F/U: 4-8 weeks. Follow-up appointment scheduled for Tuesday, 10/15/14 at 0900.  Mark Benecke A. Jimmye Norman, RD, LDN Date: 10/04/2013 Appt EndTime: [phone]

## 2013-11-07 ENCOUNTER — Telehealth (HOSPITAL_COMMUNITY): Payer: Self-pay | Admitting: Dietician

## 2013-11-07 NOTE — Telephone Encounter (Signed)
Called at [phone] to reschedule pt appointment due to provider conflict. Voicemail did not contain pt name. Will send letter to pt home to contact office.

## 2013-11-08 ENCOUNTER — Telehealth (HOSPITAL_COMMUNITY): Payer: Self-pay | Admitting: Dietician

## 2013-11-08 NOTE — Telephone Encounter (Signed)
Received return call from pt at 117. Called back at 1348, however, unable to leave voicemail. Will reattempt.

## 2013-11-08 NOTE — Telephone Encounter (Signed)
Called again at [phone]. Appointment rescheduled for 11/16/13 at 0900.

## 2013-11-22 ENCOUNTER — Encounter (HOSPITAL_COMMUNITY): Payer: Self-pay | Admitting: Dietician

## 2013-11-22 NOTE — Progress Notes (Signed)
Follow-Up Outpatient Nutrition Note Date: 11/22/2013  Appt StartTime: 0905  Nutrition Assessment:  Current weight: Weight: 304 lb (137.893 kg)  Body mass index is 53.86 kg/(m^2). Weight changes: +1# (0.3%) x 1 month  Allison Gould continues to maintain her weight. She has gained +1# since last visit. She reports she is trying to make healthier choices. She reports that she has been walking a little bit more than she used to, but it appears that activity level is about the same. Other than walking to various stores and in the parking lot, she does not exercise outside of her typical errands. She reports that she occasional;ly walks more at her doctor's office, as the locations has moved and she has to walk farther up the stairs to get to her office. She has not been to the gym in the past several visits.  She reports that she has been busy with church commitments, particularly the [employer] which "tired me out". She reports that she has been eating a lot of the stew that she has purchased at the sale recently. She reports that she has been trying to be more conscious about her grocery shopping and mentioned that she has found many healthful items at the dollar store including sandwich thins. Unfortunately, she also purchases junk foods, such as Bugels, which ar her typical snack. She has been eating grapefruits recently, as she was given them from the food pantry. She has been trying to eat [place] bacon instead of pork bacon and been baking and broiling meats.   Diet recall: Breakfast: oatmeal, coffee, juice; Lunch: broiled or baked chicken, meat, and starch;  Dinner:soup and crackers; HS snack: veggie sticks, Bugels, or grapfruit  Nutrition Diagnosis: Obesity r/t excessive energy intake and limited physical activity continues  Nutrition Intervention: Education/ counseling provided: Reviewed diet and exercise recall. Had long discussion with pt regarding food choices. Discussed ways to to choose healthier  items are grocery stores. Discussed calorie content of commonly eaten items and healthier alternatives. Gave examples of more appropriate snacks, but discouraged snacking, particularly at night. Discussed importance of being as active as possible to assist with weight control.Teach back method used.   Understanding/Motivation/ Ability to follow recommendations: Expect fair compliance.   Monitoring and Evaluation: Previous goals:  1) 1-2# weight loss per week- goal not met; 2) 20 minutes physical activity 3 times per week- progressing  Goals for next visit: 1) 1-2# weight loss per week; 2) 20 minutes physical activity 2 times per week  Recommendations: 1) Continue to choose sandwich thins when you eat bread; 2) Choose fruit and vegetables most often as snacks instead of chips; 3) Choose lean, Low sodium meats and limit intake of high sodium foods  F/U: 4-8 weeks. Follow-up appointment scheduled for Tuesday, 12/20/13 at 0900.  Allison Gould A. Jimmye Norman, RD, LDN Date: 11/22/2013 Appt EndTime: [phone]

## 2013-12-26 ENCOUNTER — Telehealth (HOSPITAL_COMMUNITY): Payer: Self-pay | Admitting: Dietician

## 2013-12-26 NOTE — Telephone Encounter (Signed)
Received voicemail left on 12/20/13 at 0836. Pt unable to come to appointment that day due to inclement weather. Called back today at 1612 and pt reports she did not want to reschedule at this time due to illness.

## 2014-04-11 ENCOUNTER — Other Ambulatory Visit (HOSPITAL_COMMUNITY): Payer: Self-pay | Admitting: Family Medicine

## 2014-04-11 DIAGNOSIS — Z1231 Encounter for screening mammogram for malignant neoplasm of breast: Secondary | ICD-10-CM

## 2014-04-14 ENCOUNTER — Ambulatory Visit (HOSPITAL_COMMUNITY)
Admission: RE | Admit: 2014-04-14 | Discharge: 2014-04-14 | Disposition: A | Discharge status: Home / Self Care | Payer: Medicare Other | Source: Ambulatory Visit | Attending: Family Medicine | Admitting: Family Medicine

## 2014-04-14 DIAGNOSIS — Z1231 Encounter for screening mammogram for malignant neoplasm of breast: Secondary | ICD-10-CM | POA: Insufficient documentation

## 2014-11-06 DIAGNOSIS — E119 Type 2 diabetes mellitus without complications: Secondary | ICD-10-CM | POA: Diagnosis not present

## 2014-11-06 DIAGNOSIS — H608X2 Other otitis externa, left ear: Secondary | ICD-10-CM | POA: Diagnosis not present

## 2014-11-06 DIAGNOSIS — J9801 Acute bronchospasm: Secondary | ICD-10-CM | POA: Diagnosis not present

## 2014-11-15 DIAGNOSIS — G5601 Carpal tunnel syndrome, right upper limb: Secondary | ICD-10-CM | POA: Diagnosis not present

## 2014-11-15 DIAGNOSIS — G5602 Carpal tunnel syndrome, left upper limb: Secondary | ICD-10-CM | POA: Diagnosis not present

## 2014-11-15 DIAGNOSIS — J9801 Acute bronchospasm: Secondary | ICD-10-CM | POA: Diagnosis not present

## 2014-11-16 DIAGNOSIS — G5602 Carpal tunnel syndrome, left upper limb: Secondary | ICD-10-CM | POA: Diagnosis not present

## 2014-11-16 DIAGNOSIS — G5601 Carpal tunnel syndrome, right upper limb: Secondary | ICD-10-CM | POA: Diagnosis not present

## 2014-12-21 DIAGNOSIS — G5602 Carpal tunnel syndrome, left upper limb: Secondary | ICD-10-CM | POA: Diagnosis not present

## 2014-12-21 DIAGNOSIS — L630 Alopecia (capitis) totalis: Secondary | ICD-10-CM | POA: Diagnosis not present

## 2014-12-21 DIAGNOSIS — H608X2 Other otitis externa, left ear: Secondary | ICD-10-CM | POA: Diagnosis not present

## 2015-01-14 ENCOUNTER — Emergency Department (HOSPITAL_COMMUNITY): Admission: EM | Admit: 2015-01-14 | Discharge: 2015-01-14 | Discharge status: Absent without leave | Payer: Self-pay

## 2015-01-14 ENCOUNTER — Emergency Department (HOSPITAL_COMMUNITY)
Admission: EM | Admit: 2015-01-14 | Discharge: 2015-01-15 | Disposition: A | Discharge status: Home / Self Care | Payer: Medicare Other | Attending: Emergency Medicine | Admitting: Emergency Medicine

## 2015-01-14 ENCOUNTER — Encounter (HOSPITAL_COMMUNITY): Payer: Self-pay | Admitting: Emergency Medicine

## 2015-01-14 DIAGNOSIS — Z87891 Personal history of nicotine dependence: Secondary | ICD-10-CM | POA: Diagnosis not present

## 2015-01-14 DIAGNOSIS — R531 Weakness: Secondary | ICD-10-CM | POA: Diagnosis not present

## 2015-01-14 DIAGNOSIS — Z79899 Other long term (current) drug therapy: Secondary | ICD-10-CM | POA: Diagnosis not present

## 2015-01-14 DIAGNOSIS — E1165 Type 2 diabetes mellitus with hyperglycemia: Secondary | ICD-10-CM | POA: Diagnosis not present

## 2015-01-14 DIAGNOSIS — I10 Essential (primary) hypertension: Secondary | ICD-10-CM | POA: Diagnosis not present

## 2015-01-14 DIAGNOSIS — M7989 Other specified soft tissue disorders: Secondary | ICD-10-CM | POA: Diagnosis not present

## 2015-01-14 DIAGNOSIS — H9313 Tinnitus, bilateral: Secondary | ICD-10-CM | POA: Diagnosis not present

## 2015-01-14 DIAGNOSIS — R42 Dizziness and giddiness: Secondary | ICD-10-CM | POA: Insufficient documentation

## 2015-01-14 DIAGNOSIS — E876 Hypokalemia: Secondary | ICD-10-CM | POA: Diagnosis not present

## 2015-01-14 DIAGNOSIS — R1110 Vomiting, unspecified: Secondary | ICD-10-CM | POA: Insufficient documentation

## 2015-01-14 DIAGNOSIS — K219 Gastro-esophageal reflux disease without esophagitis: Secondary | ICD-10-CM | POA: Insufficient documentation

## 2015-01-14 DIAGNOSIS — E119 Type 2 diabetes mellitus without complications: Secondary | ICD-10-CM | POA: Diagnosis not present

## 2015-01-14 DIAGNOSIS — N289 Disorder of kidney and ureter, unspecified: Secondary | ICD-10-CM | POA: Diagnosis not present

## 2015-01-14 DIAGNOSIS — M1990 Unspecified osteoarthritis, unspecified site: Secondary | ICD-10-CM | POA: Insufficient documentation

## 2015-01-14 LAB — COMPREHENSIVE METABOLIC PANEL
ALBUMIN: 3.8 g/dL (ref 3.5–5.2)
ALT: 28 U/L (ref 0–35)
ANION GAP: 7 (ref 5–15)
AST: 25 U/L (ref 0–37)
Alkaline Phosphatase: 67 U/L (ref 39–117)
BUN: 26 mg/dL — ABNORMAL HIGH (ref 6–23)
CHLORIDE: 105 mmol/L (ref 96–112)
CO2: 28 mmol/L (ref 19–32)
CREATININE: 1.38 mg/dL — AB (ref 0.50–1.10)
Calcium: 8.6 mg/dL (ref 8.4–10.5)
GFR, EST AFRICAN AMERICAN: 45 mL/min — AB (ref >90)
GFR, EST NON AFRICAN AMERICAN: 39 mL/min — AB (ref >90)
GLUCOSE: 133 mg/dL — AB (ref 70–99)
POTASSIUM: 3.4 mmol/L — AB (ref 3.5–5.1)
Sodium: 140 mmol/L (ref 135–145)
TOTAL PROTEIN: 7.5 g/dL (ref 6.0–8.3)
Total Bilirubin: 0.7 mg/dL (ref 0.3–1.2)

## 2015-01-14 LAB — CBC WITH DIFFERENTIAL/PLATELET
BASOS ABS: 0 10*3/uL (ref 0.0–0.1)
Basophils Relative: 0 % (ref 0–1)
Eosinophils Absolute: 0 10*3/uL (ref 0.0–0.7)
Eosinophils Relative: 0 % (ref 0–5)
HCT: 35.3 % — ABNORMAL LOW (ref 36.0–46.0)
HEMOGLOBIN: 11.5 g/dL — AB (ref 12.0–15.0)
Lymphocytes Relative: 20 % (ref 12–46)
Lymphs Abs: 1.3 10*3/uL (ref 0.7–4.0)
MCH: 30.3 pg (ref 26.0–34.0)
MCHC: 32.6 g/dL (ref 30.0–36.0)
MCV: 92.9 fL (ref 78.0–100.0)
MONO ABS: 0.5 10*3/uL (ref 0.1–1.0)
Monocytes Relative: 8 % (ref 3–12)
Neutro Abs: 4.5 10*3/uL (ref 1.7–7.7)
Neutrophils Relative %: 72 % (ref 43–77)
PLATELETS: 142 10*3/uL — AB (ref 150–400)
RBC: 3.8 MIL/uL — AB (ref 3.87–5.11)
RDW: 14 % (ref 11.5–15.5)
WBC: 6.3 10*3/uL (ref 4.0–10.5)

## 2015-01-14 LAB — CBG MONITORING, ED: Glucose-Capillary: 98 mg/dL (ref 70–99)

## 2015-01-14 LAB — URINALYSIS, ROUTINE W REFLEX MICROSCOPIC
Bilirubin Urine: NEGATIVE
GLUCOSE, UA: NEGATIVE mg/dL
Hgb urine dipstick: NEGATIVE
KETONES UR: NEGATIVE mg/dL
Nitrite: NEGATIVE
PH: 6 (ref 5.0–8.0)
PROTEIN: NEGATIVE mg/dL
SPECIFIC GRAVITY, URINE: 1.015 (ref 1.005–1.030)
Urobilinogen, UA: 0.2 mg/dL (ref 0.0–1.0)

## 2015-01-14 LAB — URINE MICROSCOPIC-ADD ON

## 2015-01-14 MED ORDER — POTASSIUM CHLORIDE CRYS ER 20 MEQ PO TBCR
40.0000 meq | EXTENDED_RELEASE_TABLET | Freq: Once | ORAL | Status: AC
  Administered 2015-01-14: 40 meq via ORAL
  Filled 2015-01-14: qty 2

## 2015-01-14 MED ORDER — MECLIZINE HCL 50 MG PO TABS: 25.0000 mg | ORAL_TABLET | Freq: Three times a day (TID) | ORAL | Status: DC | PRN

## 2015-01-14 MED ORDER — MECLIZINE HCL 12.5 MG PO TABS
25.0000 mg | ORAL_TABLET | Freq: Once | ORAL | Status: AC
  Administered 2015-01-14: 25 mg via ORAL
  Filled 2015-01-14: qty 2

## 2015-01-14 NOTE — ED Provider Notes (Signed)
CSN: [id number]     Arrival date & time 01/14/15  2006 History   This chart was scribed for Orlie Dakin, MD by Chester Holstein, ED Scribe. This patient was seen in room APA01/APA01 and the patient's care was started at 9:28 PM.     Chief Complaint  Patient presents with  . Emesis  . Weakness     Patient is a 64 y.o. female presenting with vomiting and weakness. The history is provided by the patient. No language interpreter was used.  Emesis Weakness   HPI Comments: Allison Gould is a 64 y.o. female with PMHx of who presents to the Emergency Department complaining of dizziness and vomiting with onset 3:30 PM today. Dizziness is described as sensation of room spinning. Symptoms accompanied by nausea and vomiting. Symptoms worse with changing position improved with remaining still. Associated symptoms include tinnitus. Pt states she feels more like baseline while resting in exam room.  Pt notes recurrent tinnitus, last instance was yesterday. Pt reports recent leg swelling with onset 5 days ago. Pt states swelling has improved since arrival to the ED.  Pt notes intermittent left hand numbness for 3-5 years following right shoulder surgery. Pt has seen PCP for this. Pt denies EtOH and drug use. Pt is a former smoker.  Pt denies SOB, fever, diarrhea, and difficulty speaking.  Pt's PCP is Dr. Criss Rosales.   Past Medical History  Diagnosis Date  . Diabetes mellitus type II   . Hypertension   . Low back pain   . Osteoarthritis   . Anxiety   . Lipid disorder   . Morbid obesity with BMI of 45.0-49.9, adult   . GERD (gastroesophageal reflux disease)    Past Surgical History  Procedure Laterality Date  . Lipoma-right shoulder    . Spur and nerve repair right shoulder    . Colonoscopy  07/12/2012    Procedure: COLONOSCOPY;  Surgeon: Danie Binder, MD;  Location: [facility];  Service: Endoscopy;  Laterality: N/A;  1:00   Family History  Problem Relation Age of Onset  . Colon cancer Neg Hx   .  Liver disease Neg Hx   . Inflammatory bowel disease Neg Hx   . Lupus Sister    History  Substance Use Topics  . Smoking status: Former Smoker -- 0.50 packs/day    Types: Cigarettes  . Smokeless tobacco: Not on file     Comment: quit years ago  . Alcohol Use: No   OB History    No data available     Review of Systems  Constitutional: Negative.   HENT: Positive for tinnitus.   Respiratory: Negative.   Cardiovascular: Positive for leg swelling.  Gastrointestinal: Positive for vomiting.  Musculoskeletal: Negative.   Skin: Negative.   Allergic/Immunologic: Positive for immunocompromised state.       Diabetic  Neurological: Positive for dizziness and numbness (chronic).  Psychiatric/Behavioral: Negative.   All other systems reviewed and are negative.     Allergies  Review of patient's allergies indicates no known allergies.  Home Medications   Prior to Admission medications   Medication Sig Start Date End Date Taking? Authorizing Provider  acetaminophen (TYLENOL) 500 MG tablet Take 1,000 mg by mouth every 6 (six) hours as needed for pain. Pain    Historical Provider, MD  amLODipine (NORVASC) 10 MG tablet Take 10 mg by mouth daily.  06/05/12   Historical Provider, MD  Carboxymethylcellul-Glycerin 0.5-0.9 % SOLN Place 1 drop into both eyes daily as needed. Dry  Eyes    Historical Provider, MD  cholecalciferol (VITAMIN D) 1000 UNITS tablet Take 1,000 Units by mouth daily.    Historical Provider, MD  cyclobenzaprine (FLEXERIL) 10 MG tablet Take 10 mg by mouth 3 (three) times daily as needed. Muscle Spasms    Historical Provider, MD  furosemide (LASIX) 40 MG tablet Take 40 mg by mouth daily.  05/07/12   Historical Provider, MD  gabapentin (NEURONTIN) 100 MG capsule Take 100-200 mg by mouth 3 (three) times daily. Depends on pain rate    Historical Provider, MD  HYDROcodone-acetaminophen (NORCO/VICODIN) 5-325 MG per tablet Take one-two tabs po q 4-6 hrs prn pain 02/13/13   Tammi Triplett,  PA-C  lisinopril (PRINIVIL,ZESTRIL) 40 MG tablet Take 40 mg by mouth daily.  06/05/12   Historical Provider, MD  metFORMIN (GLUCOPHAGE) 500 MG tablet Take 500 mg by mouth 2 (two) times daily with a meal.  05/07/12   Historical Provider, MD  nitroGLYCERIN (NITROSTAT) 0.4 MG SL tablet Place 0.4 mg under the tongue every 5 (five) minutes as needed. Chest Pain    Historical Provider, MD  omeprazole (PRILOSEC) 20 MG capsule Take 20 mg by mouth daily.  06/05/12   Historical Provider, MD  spironolactone (ALDACTONE) 25 MG tablet Take 25 mg by mouth 2 (two) times daily.  05/07/12   Historical Provider, MD   BP 139/53 mmHg  Pulse 73  Temp(Src) 97.9 F (36.6 C) (Oral)  Resp 20  Ht 5\' 5"  (1.651 m)  Wt 320 lb (145.151 kg)  BMI 53.25 kg/m2  SpO2 100% Physical Exam  Constitutional: She is oriented to person, place, and time. She appears well-developed and well-nourished.  HENT:  Head: Normocephalic and atraumatic.  Right Ear: External ear normal.  Left Ear: External ear normal.  Bilateral tympanic membranes normal  Eyes: Conjunctivae are normal. Pupils are equal, round, and reactive to light.  Neck: Neck supple. No tracheal deviation present. No thyromegaly present.  Cardiovascular: Normal rate and regular rhythm.   No murmur heard. Pulmonary/Chest: Effort normal and breath sounds normal.  Abdominal: Soft. Bowel sounds are normal. She exhibits no distension. There is no tenderness.  Musculoskeletal: Normal range of motion. She exhibits no edema or tenderness.  Gait normal Romberg normal pronator drift normal finger to nose normal  Neurological: She is alert and oriented to person, place, and time. She has normal reflexes. Coordination normal.  DTRs symmetric bilaterally knee jerk ankle jerk biceps  Skin: Skin is warm and dry. No rash noted.  Psychiatric: She has a normal mood and affect.  Nursing note and vitals reviewed.   ED Course  Procedures (including critical care time) DIAGNOSTIC  STUDIES: Oxygen Saturation is 100% on room air, normal by my interpretation.    COORDINATION OF CARE: 9:38 PM Discussed treatment plan with patient at beside, the patient agrees with the plan and has no further questions at this time. Pt's son will be driving home.    Labs Review Labs Reviewed  CBC WITH DIFFERENTIAL/PLATELET  COMPREHENSIVE METABOLIC PANEL  URINALYSIS, ROUTINE W REFLEX MICROSCOPIC  CBG MONITORING, ED    Imaging Review No results found.   EKG Interpretation None     11:15 PM feels improved after treatment with meclizine. Able to drink water without difficulty Results for orders placed or performed during the hospital encounter of 01/14/15  CBC with Differential  Result Value Ref Range   WBC 6.3 4.0 - 10.5 K/uL   RBC 3.80 (L) 3.87 - 5.11 MIL/uL   Hemoglobin 11.5 (L)  12.0 - 15.0 g/dL   HCT 35.3 (L) 36.0 - 46.0 %   MCV 92.9 78.0 - 100.0 fL   MCH 30.3 26.0 - 34.0 pg   MCHC 32.6 30.0 - 36.0 g/dL   RDW 14.0 11.5 - 15.5 %   Platelets 142 (L) 150 - 400 K/uL   Neutrophils Relative % 72 43 - 77 %   Neutro Abs 4.5 1.7 - 7.7 K/uL   Lymphocytes Relative 20 12 - 46 %   Lymphs Abs 1.3 0.7 - 4.0 K/uL   Monocytes Relative 8 3 - 12 %   Monocytes Absolute 0.5 0.1 - 1.0 K/uL   Eosinophils Relative 0 0 - 5 %   Eosinophils Absolute 0.0 0.0 - 0.7 K/uL   Basophils Relative 0 0 - 1 %   Basophils Absolute 0.0 0.0 - 0.1 K/uL  Comprehensive metabolic panel  Result Value Ref Range   Sodium 140 135 - 145 mmol/L   Potassium 3.4 (L) 3.5 - 5.1 mmol/L   Chloride 105 96 - 112 mmol/L   CO2 28 19 - 32 mmol/L   Glucose, Bld 133 (H) 70 - 99 mg/dL   BUN 26 (H) 6 - 23 mg/dL   Creatinine, Ser 1.38 (H) 0.50 - 1.10 mg/dL   Calcium 8.6 8.4 - 10.5 mg/dL   Total Protein 7.5 6.0 - 8.3 g/dL   Albumin 3.8 3.5 - 5.2 g/dL   AST 25 0 - 37 U/L   ALT 28 0 - 35 U/L   Alkaline Phosphatase 67 39 - 117 U/L   Total Bilirubin 0.7 0.3 - 1.2 mg/dL   GFR calc non Af Amer 39 (L) >90 mL/min   GFR calc Af  Amer 45 (L) >90 mL/min   Anion gap 7 5 - 15  Urinalysis, Routine w reflex microscopic  Result Value Ref Range   Color, Urine YELLOW YELLOW   APPearance CLEAR CLEAR   Specific Gravity, Urine 1.015 1.005 - 1.030   pH 6.0 5.0 - 8.0   Glucose, UA NEGATIVE NEGATIVE mg/dL   Hgb urine dipstick NEGATIVE NEGATIVE   Bilirubin Urine NEGATIVE NEGATIVE   Ketones, ur NEGATIVE NEGATIVE mg/dL   Protein, ur NEGATIVE NEGATIVE mg/dL   Urobilinogen, UA 0.2 0.0 - 1.0 mg/dL   Nitrite NEGATIVE NEGATIVE   Leukocytes, UA TRACE (A) NEGATIVE  Urine microscopic-add on  Result Value Ref Range   Squamous Epithelial / LPF RARE RARE   WBC, UA 3-6 <3 WBC/hpf   RBC / HPF 0-2 <3 RBC/hpf   Bacteria, UA RARE RARE  POC CBG, ED  Result Value Ref Range   Glucose-Capillary 98 70 - 99 mg/dL   Comment 1 Notify RN    No results found.  MDM  Vertigo is felt to be peripheral in etiology. No signs of stroke. No focal neurologic deficit. Intermittent left hand numbness since surgery several years ago, not felt to be related to today's event. Plan prescription meclizine Dx #1 vertigo #2 hyperglycemia #3 mild renal insufficiency #4 hypokalemia Final diagnoses:  None    I personally performed the services described in this documentation, which was scribed in my presence. The recorded information has been reviewed and considered.    Orlie Dakin, MD 01/14/15 2330

## 2015-01-14 NOTE — ED Notes (Signed)
Pt c/o weakness, dizziness and emesis starting at 1700 this evening. Denies diarrhea. Denies any aches, cough, fevers but states her legs have been swelling more. Also c/o left hand numbness but states "that comes and goes. My doctor has me wear a brace for it sometimes".

## 2015-01-14 NOTE — Discharge Instructions (Signed)
Benign Positional Vertigo Your blood sugar tonight was mildly elevated at 133. Blood potassium was slightly low at 3.4. Kidney function was slightly abnormal with BUN 26 and creatinine 1.38. Call Dr. Criss Rosales tomorrow to ask her to recheck these lab values within the next one or 2 weeks. Take these instructions with you when you go to her office. You can take the medication prescribed as needed for dizziness or nausea. Return if your condition worsens for any reason. Vertigo means you feel like you or your surroundings are moving when they are not. Benign positional vertigo is the most common form of vertigo. Benign means that the cause of your condition is not serious. Benign positional vertigo is more common in older adults. CAUSES  Benign positional vertigo is the result of an upset in the labyrinth system. This is an area in the middle ear that helps control your balance. This may be caused by a viral infection, head injury, or repetitive motion. However, often no specific cause is found. SYMPTOMS  Symptoms of benign positional vertigo occur when you move your head or eyes in different directions. Some of the symptoms may include:  Loss of balance and falls.  Vomiting.  Blurred vision.  Dizziness.  Nausea.  Involuntary eye movements (nystagmus). DIAGNOSIS  Benign positional vertigo is usually diagnosed by physical exam. If the specific cause of your benign positional vertigo is unknown, your caregiver may perform imaging tests, such as magnetic resonance imaging (MRI) or computed tomography (CT). TREATMENT  Your caregiver may recommend movements or procedures to correct the benign positional vertigo. Medicines such as meclizine, benzodiazepines, and medicines for nausea may be used to treat your symptoms. In rare cases, if your symptoms are caused by certain conditions that affect the inner ear, you may need surgery. HOME CARE INSTRUCTIONS   Follow your caregiver's instructions.  Move  slowly. Do not make sudden body or head movements.  Avoid driving.  Avoid operating heavy machinery.  Avoid performing any tasks that would be dangerous to you or others during a vertigo episode.  Drink enough fluids to keep your urine clear or pale yellow. SEEK IMMEDIATE MEDICAL CARE IF:   You develop problems with walking, weakness, numbness, or using your arms, hands, or legs.  You have difficulty speaking.  You develop severe headaches.  Your nausea or vomiting continues or gets worse.  You develop visual changes.  Your family or friends notice any behavioral changes.  Your condition gets worse.  You have a fever.  You develop a stiff neck or sensitivity to light. MAKE SURE YOU:   Understand these instructions.  Will watch your condition.  Will get help right away if you are not doing well or get worse. Document Released: 07/28/2006 Document Revised: 01/12/2012 Document Reviewed: 07/10/2011 [facility] Patient Information 2015 [place], [place]. This information is not intended to replace advice given to you by your health care provider. Make sure you discuss any questions you have with your health care provider.

## 2015-01-22 DIAGNOSIS — E874 Mixed disorder of acid-base balance: Secondary | ICD-10-CM | POA: Diagnosis not present

## 2015-01-22 DIAGNOSIS — H8303 Labyrinthitis, bilateral: Secondary | ICD-10-CM | POA: Diagnosis not present

## 2015-01-22 DIAGNOSIS — E780 Pure hypercholesterolemia: Secondary | ICD-10-CM | POA: Diagnosis not present

## 2015-01-22 DIAGNOSIS — E860 Dehydration: Secondary | ICD-10-CM | POA: Diagnosis not present

## 2015-01-22 DIAGNOSIS — I10 Essential (primary) hypertension: Secondary | ICD-10-CM | POA: Diagnosis not present

## 2015-01-25 DIAGNOSIS — G5602 Carpal tunnel syndrome, left upper limb: Secondary | ICD-10-CM | POA: Diagnosis not present

## 2015-01-25 DIAGNOSIS — G5601 Carpal tunnel syndrome, right upper limb: Secondary | ICD-10-CM | POA: Diagnosis not present

## 2015-02-12 DIAGNOSIS — E119 Type 2 diabetes mellitus without complications: Secondary | ICD-10-CM | POA: Diagnosis not present

## 2015-02-12 DIAGNOSIS — G5600 Carpal tunnel syndrome, unspecified upper limb: Secondary | ICD-10-CM | POA: Diagnosis not present

## 2015-05-15 DIAGNOSIS — I10 Essential (primary) hypertension: Secondary | ICD-10-CM | POA: Diagnosis not present

## 2015-05-15 DIAGNOSIS — E119 Type 2 diabetes mellitus without complications: Secondary | ICD-10-CM | POA: Diagnosis not present

## 2015-05-15 DIAGNOSIS — M130 Polyarthritis, unspecified: Secondary | ICD-10-CM | POA: Diagnosis not present

## 2015-08-21 DIAGNOSIS — E0869 Diabetes mellitus due to underlying condition with other specified complication: Secondary | ICD-10-CM | POA: Diagnosis not present

## 2015-08-21 DIAGNOSIS — M7602 Gluteal tendinitis, left hip: Secondary | ICD-10-CM | POA: Diagnosis not present

## 2015-08-21 DIAGNOSIS — I10 Essential (primary) hypertension: Secondary | ICD-10-CM | POA: Diagnosis not present

## 2015-08-21 DIAGNOSIS — L630 Alopecia (capitis) totalis: Secondary | ICD-10-CM | POA: Diagnosis not present

## 2015-09-13 DIAGNOSIS — I10 Essential (primary) hypertension: Secondary | ICD-10-CM | POA: Diagnosis not present

## 2015-09-13 DIAGNOSIS — E0869 Diabetes mellitus due to underlying condition with other specified complication: Secondary | ICD-10-CM | POA: Diagnosis not present

## 2015-09-13 DIAGNOSIS — Z0000 Encounter for general adult medical examination without abnormal findings: Secondary | ICD-10-CM | POA: Diagnosis not present

## 2015-10-25 DIAGNOSIS — E0869 Diabetes mellitus due to underlying condition with other specified complication: Secondary | ICD-10-CM | POA: Diagnosis not present

## 2015-10-25 DIAGNOSIS — H900 Conductive hearing loss, bilateral: Secondary | ICD-10-CM | POA: Diagnosis not present

## 2015-10-25 DIAGNOSIS — I10 Essential (primary) hypertension: Secondary | ICD-10-CM | POA: Diagnosis not present

## 2016-03-08 DIAGNOSIS — M26603 Bilateral temporomandibular joint disorder, unspecified: Secondary | ICD-10-CM | POA: Diagnosis not present

## 2016-03-26 ENCOUNTER — Other Ambulatory Visit (HOSPITAL_COMMUNITY): Payer: Self-pay | Admitting: Internal Medicine

## 2016-03-26 DIAGNOSIS — Z1231 Encounter for screening mammogram for malignant neoplasm of breast: Secondary | ICD-10-CM

## 2016-04-03 ENCOUNTER — Ambulatory Visit (HOSPITAL_COMMUNITY)
Admission: RE | Admit: 2016-04-03 | Discharge: 2016-04-03 | Disposition: A | Discharge status: Home / Self Care | Payer: Medicare Other | Source: Ambulatory Visit | Attending: Internal Medicine | Admitting: Internal Medicine

## 2016-04-03 DIAGNOSIS — Z1231 Encounter for screening mammogram for malignant neoplasm of breast: Secondary | ICD-10-CM | POA: Diagnosis not present

## 2016-05-01 ENCOUNTER — Other Ambulatory Visit: Payer: Self-pay | Admitting: Gastroenterology

## 2016-05-01 DIAGNOSIS — E119 Type 2 diabetes mellitus without complications: Secondary | ICD-10-CM | POA: Diagnosis not present

## 2016-05-01 DIAGNOSIS — H25812 Combined forms of age-related cataract, left eye: Secondary | ICD-10-CM | POA: Diagnosis not present

## 2016-05-01 DIAGNOSIS — H25813 Combined forms of age-related cataract, bilateral: Secondary | ICD-10-CM | POA: Diagnosis not present

## 2016-05-07 NOTE — Patient Instructions (Signed)
Casey Negro  05/07/2016     @PREFPERIOPPHARMACY @   Your procedure is scheduled on 05/12/2016.  Report to [facility] at 9:30 A.M.  Call this number if you have problems the morning of surgery:  [phone]   Remember:  Do not eat food or drink liquids after midnight.  Take these medicines the morning of surgery with A SIP OF WATER : NORVASC, FLEXERIL, NEURONTIN, VICODIN, LISINOPRIL AND PRILOSEC   Do not wear jewelry, make-up or nail polish.  Do not wear lotions, powders, or perfumes.  You may wear deoderant.  Do not shave 48 hours prior to surgery.  Men may shave face and neck.  Do not bring valuables to the hospital.  [facility] is not responsible for any belongings or valuables.  Contacts, dentures or bridgework may not be worn into surgery.  Leave your suitcase in the car.  After surgery it may be brought to your room.  For patients admitted to the hospital, discharge time will be determined by your treatment team.  Patients discharged the day of surgery will not be allowed to drive home.   Name and phone number of your driver:   FAMILY Special instructions:  N/A  Please read over the following fact sheets that you were given. Care and Recovery After Surgery    Cataract A cataract is a clouding of the lens of the eye. When a lens becomes cloudy, vision is reduced based on the degree and nature of the clouding. Many cataracts reduce vision to some degree. Some cataracts make people more near-sighted as they develop. Other cataracts increase glare. Cataracts that are ignored and become worse can sometimes look white. The white color can be seen through the pupil. CAUSES   Aging. However, cataracts may occur at any age, even in newborns.  Certain drugs.  Trauma to the eye.  Certain diseases such as diabetes.  Specific eye diseases such as chronic inflammation inside the eye or a sudden attack of a rare form of glaucoma.  Inherited or acquired medical  problems. SYMPTOMS   Gradual, progressive drop in vision in the affected eye.  Severe, rapid visual loss. This most often happens when trauma is the cause. DIAGNOSIS  To detect a cataract, an eye doctor examines the lens. Cataracts are best diagnosed with an exam of the eyes with the pupils enlarged (dilated) by drops.  TREATMENT  For an early cataract, vision may improve by using different eyeglasses or stronger lighting. If that does not help your vision, surgery is the only effective treatment. A cataract needs to be surgically removed when vision loss interferes with your everyday activities, such as driving, reading, or watching TV. A cataract may also have to be removed if it prevents examination or treatment of another eye problem. Surgery removes the cloudy lens and usually replaces it with a substitute lens (intraocular lens, IOL).  At a time when both you and your doctor agree, the cataract will be surgically removed. If you have cataracts in both eyes, only one is usually removed at a time. This allows the operated eye to heal and be out of danger from any possible problems after surgery (such as infection or poor wound healing). In rare cases, a cataract may be doing damage to your eye. In these cases, your caregiver may advise surgical removal right away. The vast majority of people who have cataract surgery have better vision afterward. HOME CARE INSTRUCTIONS  If you are not planning surgery, you may be asked  to do the following:  Use different eyeglasses.  Use stronger or brighter lighting.  Ask your eye doctor about reducing your medicine dose or changing medicines if it is thought that a medicine caused your cataract. Changing medicines does not make the cataract go away on its own.  Become familiar with your surroundings. Poor vision can lead to injury. Avoid bumping into things on the affected side. You are at a higher risk for tripping or falling.  Exercise extreme care when  driving or operating machinery.  Wear sunglasses if you are sensitive to bright light or experiencing problems with glare. SEEK IMMEDIATE MEDICAL CARE IF:   You have a worsening or sudden vision loss.  You notice redness, swelling, or increasing pain in the eye.  You have a fever.   This information is not intended to replace advice given to you by your health care provider. Make sure you discuss any questions you have with your health care provider.   Document Released: 10/20/2005 Document Revised: 01/12/2012 Document Reviewed: 04/25/2015 Elsevier Interactive Patient Education [employer].

## 2016-05-09 ENCOUNTER — Other Ambulatory Visit: Payer: Self-pay

## 2016-05-09 ENCOUNTER — Encounter (HOSPITAL_COMMUNITY)
Admission: RE | Admit: 2016-05-09 | Discharge: 2016-05-09 | Disposition: A | Discharge status: Home / Self Care | Payer: Medicare Other | Source: Ambulatory Visit | Attending: Ophthalmology | Admitting: Ophthalmology

## 2016-05-09 ENCOUNTER — Encounter (HOSPITAL_COMMUNITY): Payer: Self-pay

## 2016-05-09 DIAGNOSIS — Z7984 Long term (current) use of oral hypoglycemic drugs: Secondary | ICD-10-CM | POA: Diagnosis not present

## 2016-05-09 DIAGNOSIS — I10 Essential (primary) hypertension: Secondary | ICD-10-CM | POA: Diagnosis not present

## 2016-05-09 DIAGNOSIS — E1136 Type 2 diabetes mellitus with diabetic cataract: Secondary | ICD-10-CM | POA: Diagnosis not present

## 2016-05-09 DIAGNOSIS — H25812 Combined forms of age-related cataract, left eye: Secondary | ICD-10-CM | POA: Diagnosis not present

## 2016-05-09 DIAGNOSIS — Z79899 Other long term (current) drug therapy: Secondary | ICD-10-CM | POA: Diagnosis not present

## 2016-05-09 DIAGNOSIS — Z87891 Personal history of nicotine dependence: Secondary | ICD-10-CM | POA: Diagnosis not present

## 2016-05-09 DIAGNOSIS — K219 Gastro-esophageal reflux disease without esophagitis: Secondary | ICD-10-CM | POA: Diagnosis not present

## 2016-05-09 HISTORY — DX: Nonscarring hair loss, unspecified: L65.9

## 2016-05-09 HISTORY — DX: Presence of external hearing-aid: Z97.4

## 2016-05-09 LAB — CBC WITH DIFFERENTIAL/PLATELET
BASOS PCT: 0 %
Basophils Absolute: 0 10*3/uL (ref 0.0–0.1)
EOS ABS: 0.1 10*3/uL (ref 0.0–0.7)
Eosinophils Relative: 3 %
HCT: 33.7 % — ABNORMAL LOW (ref 36.0–46.0)
Hemoglobin: 11.1 g/dL — ABNORMAL LOW (ref 12.0–15.0)
Lymphocytes Relative: 37 %
Lymphs Abs: 1.7 10*3/uL (ref 0.7–4.0)
MCH: 31 pg (ref 26.0–34.0)
MCHC: 32.9 g/dL (ref 30.0–36.0)
MCV: 94.1 fL (ref 78.0–100.0)
MONO ABS: 0.4 10*3/uL (ref 0.1–1.0)
MONOS PCT: 9 %
NEUTROS PCT: 51 %
Neutro Abs: 2.4 10*3/uL (ref 1.7–7.7)
Platelets: 141 10*3/uL — ABNORMAL LOW (ref 150–400)
RBC: 3.58 MIL/uL — ABNORMAL LOW (ref 3.87–5.11)
RDW: 14.5 % (ref 11.5–15.5)
WBC: 4.6 10*3/uL (ref 4.0–10.5)

## 2016-05-09 LAB — BASIC METABOLIC PANEL
Anion gap: 4 — ABNORMAL LOW (ref 5–15)
BUN: 23 mg/dL — ABNORMAL HIGH (ref 6–20)
CALCIUM: 8.6 mg/dL — AB (ref 8.9–10.3)
CO2: 26 mmol/L (ref 22–32)
CREATININE: 1.16 mg/dL — AB (ref 0.44–1.00)
Chloride: 110 mmol/L (ref 101–111)
GFR calc Af Amer: 56 mL/min — ABNORMAL LOW (ref >60)
GFR calc non Af Amer: 48 mL/min — ABNORMAL LOW (ref >60)
GLUCOSE: 103 mg/dL — AB (ref 65–99)
Potassium: 4.1 mmol/L (ref 3.5–5.1)
Sodium: 140 mmol/L (ref 135–145)

## 2016-05-12 ENCOUNTER — Ambulatory Visit (HOSPITAL_COMMUNITY): Payer: Medicare Other | Admitting: Anesthesiology

## 2016-05-12 ENCOUNTER — Encounter (HOSPITAL_COMMUNITY)
Admission: RE | Disposition: A | Discharge status: Home / Self Care | Payer: Self-pay | Source: Ambulatory Visit | Attending: Ophthalmology

## 2016-05-12 ENCOUNTER — Ambulatory Visit (HOSPITAL_COMMUNITY)
Admission: RE | Admit: 2016-05-12 | Discharge: 2016-05-12 | Disposition: A | Discharge status: Home / Self Care | Payer: Medicare Other | Source: Ambulatory Visit | Attending: Ophthalmology | Admitting: Ophthalmology

## 2016-05-12 ENCOUNTER — Encounter (HOSPITAL_COMMUNITY): Payer: Self-pay | Admitting: *Deleted

## 2016-05-12 DIAGNOSIS — E1136 Type 2 diabetes mellitus with diabetic cataract: Secondary | ICD-10-CM | POA: Diagnosis not present

## 2016-05-12 DIAGNOSIS — H25812 Combined forms of age-related cataract, left eye: Secondary | ICD-10-CM | POA: Diagnosis not present

## 2016-05-12 DIAGNOSIS — Z7984 Long term (current) use of oral hypoglycemic drugs: Secondary | ICD-10-CM | POA: Diagnosis not present

## 2016-05-12 DIAGNOSIS — Z87891 Personal history of nicotine dependence: Secondary | ICD-10-CM | POA: Insufficient documentation

## 2016-05-12 DIAGNOSIS — I10 Essential (primary) hypertension: Secondary | ICD-10-CM | POA: Insufficient documentation

## 2016-05-12 DIAGNOSIS — H269 Unspecified cataract: Secondary | ICD-10-CM | POA: Diagnosis not present

## 2016-05-12 DIAGNOSIS — K219 Gastro-esophageal reflux disease without esophagitis: Secondary | ICD-10-CM | POA: Insufficient documentation

## 2016-05-12 DIAGNOSIS — Z79899 Other long term (current) drug therapy: Secondary | ICD-10-CM | POA: Insufficient documentation

## 2016-05-12 HISTORY — PX: CATARACT EXTRACTION W/PHACO: SHX586

## 2016-05-12 LAB — GLUCOSE, CAPILLARY: Glucose-Capillary: 82 mg/dL (ref 65–99)

## 2016-05-12 SURGERY — PHACOEMULSIFICATION, CATARACT, WITH IOL INSERTION
Anesthesia: Monitor Anesthesia Care | Site: Eye | Laterality: Left

## 2016-05-12 MED ORDER — BSS IO SOLN
INTRAOCULAR | Status: DC | PRN
  Administered 2016-05-12: 15 mL

## 2016-05-12 MED ORDER — MIDAZOLAM HCL 2 MG/2ML IJ SOLN
INTRAMUSCULAR | Status: AC
  Filled 2016-05-12: qty 2

## 2016-05-12 MED ORDER — CYCLOPENTOLATE-PHENYLEPHRINE 0.2-1 % OP SOLN
1.0000 [drp] | OPHTHALMIC | Status: AC
  Administered 2016-05-12 (×3): 1 [drp] via OPHTHALMIC

## 2016-05-12 MED ORDER — ONDANSETRON HCL 4 MG/2ML IJ SOLN
INTRAMUSCULAR | Status: AC
  Filled 2016-05-12: qty 2

## 2016-05-12 MED ORDER — MIDAZOLAM HCL 2 MG/2ML IJ SOLN
1.0000 mg | INTRAMUSCULAR | Status: DC | PRN
  Administered 2016-05-12: 2 mg via INTRAVENOUS

## 2016-05-12 MED ORDER — LIDOCAINE HCL (PF) 1 % IJ SOLN
INTRAMUSCULAR | Status: DC | PRN
  Administered 2016-05-12: .7 mL

## 2016-05-12 MED ORDER — LIDOCAINE 3.5 % OP GEL OPTIME - NO CHARGE
OPHTHALMIC | Status: DC | PRN
  Administered 2016-05-12: 1 [drp] via OPHTHALMIC

## 2016-05-12 MED ORDER — POVIDONE-IODINE 5 % OP SOLN
OPHTHALMIC | Status: DC | PRN
  Administered 2016-05-12: 1 via OPHTHALMIC

## 2016-05-12 MED ORDER — PHENYLEPHRINE HCL 2.5 % OP SOLN
1.0000 [drp] | OPHTHALMIC | Status: AC
  Administered 2016-05-12 (×3): 1 [drp] via OPHTHALMIC

## 2016-05-12 MED ORDER — NEOMYCIN-POLYMYXIN-DEXAMETH 3.5-10000-0.1 OP SUSP
OPHTHALMIC | Status: DC | PRN
  Administered 2016-05-12: 2 [drp] via OPHTHALMIC

## 2016-05-12 MED ORDER — EPINEPHRINE HCL 1 MG/ML IJ SOLN
INTRAMUSCULAR | Status: AC
  Filled 2016-05-12: qty 1

## 2016-05-12 MED ORDER — PROVISC 10 MG/ML IO SOLN
INTRAOCULAR | Status: DC | PRN
  Administered 2016-05-12: 0.85 mL via INTRAOCULAR

## 2016-05-12 MED ORDER — LACTATED RINGERS IV SOLN
INTRAVENOUS | Status: DC
  Administered 2016-05-12: 11:00:00 via INTRAVENOUS

## 2016-05-12 MED ORDER — ONDANSETRON HCL 4 MG/2ML IJ SOLN
4.0000 mg | Freq: Once | INTRAMUSCULAR | Status: AC
  Administered 2016-05-12: 4 mg via INTRAVENOUS

## 2016-05-12 MED ORDER — FENTANYL CITRATE (PF) 100 MCG/2ML IJ SOLN
INTRAMUSCULAR | Status: AC
  Filled 2016-05-12: qty 2

## 2016-05-12 MED ORDER — LIDOCAINE HCL 3.5 % OP GEL
1.0000 "application " | Freq: Once | OPHTHALMIC | Status: AC
  Administered 2016-05-12: 1 via OPHTHALMIC

## 2016-05-12 MED ORDER — EPINEPHRINE HCL 1 MG/ML IJ SOLN
INTRAMUSCULAR | Status: DC | PRN
  Administered 2016-05-12: 500 mL

## 2016-05-12 MED ORDER — TETRACAINE HCL 0.5 % OP SOLN
1.0000 [drp] | OPHTHALMIC | Status: AC
  Administered 2016-05-12 (×3): 1 [drp] via OPHTHALMIC

## 2016-05-12 MED ORDER — FENTANYL CITRATE (PF) 100 MCG/2ML IJ SOLN
25.0000 ug | INTRAMUSCULAR | Status: DC | PRN
  Administered 2016-05-12: 25 ug via INTRAVENOUS

## 2016-05-12 SURGICAL SUPPLY — 11 items
CLOTH BEACON ORANGE TIMEOUT ST (SAFETY) ×2 IMPLANT
EYE SHIELD UNIVERSAL CLEAR (GAUZE/BANDAGES/DRESSINGS) ×2 IMPLANT
GLOVE BIOGEL PI IND STRL 7.0 (GLOVE) ×1 IMPLANT
GLOVE BIOGEL PI INDICATOR 7.0 (GLOVE) ×2
GLOVE EXAM NITRILE MD LF STRL (GLOVE) ×2 IMPLANT
PAD ARMBOARD 7.5X6 YLW CONV (MISCELLANEOUS) ×2 IMPLANT
SIGHTPATH CAT PROC W REG LENS (Ophthalmic Related) ×3 IMPLANT
SYRINGE LUER LOK 1CC (MISCELLANEOUS) ×2 IMPLANT
TAPE SURG TRANSPORE 1 IN (GAUZE/BANDAGES/DRESSINGS) IMPLANT
TAPE SURGICAL TRANSPORE 1 IN (GAUZE/BANDAGES/DRESSINGS) ×2
WATER STERILE IRR 250ML POUR (IV SOLUTION) ×2 IMPLANT

## 2016-05-12 NOTE — H&P (Signed)
I have reviewed the H&P, the patient was re-examined, and I have identified no interval changes in medical condition and plan of care since the history and physical of record  

## 2016-05-12 NOTE — Discharge Instructions (Signed)
Anesthesia, Adult General anesthesia is a sleep-like state of non-feeling produced by medicines (anesthetics). General anesthesia prevents you from being alert and feeling pain during a medical procedure. Your caregiver may recommend general anesthesia if your procedure:  Is long.  Is painful or uncomfortable.  Would be frightening to see or hear.  Requires you to be still.  Affects your breathing.  Causes significant blood loss. LET YOUR CAREGIVER KNOW ABOUT:  Allergies to food or medicine.  Medicines taken, including vitamins, herbs, eyedrops, over-the-counter medicines, and creams.  Use of steroids (by mouth or creams).  Previous problems with anesthetics or numbing medicines, including problems experienced by relatives.  History of bleeding problems or blood clots.  Previous surgeries and types of anesthetics received.  Possibility of pregnancy, if this applies.  Use of cigarettes, alcohol, or illegal drugs.  Any health condition(s), especially diabetes, sleep apnea, and high blood pressure. RISKS AND COMPLICATIONS General anesthesia rarely causes complications. However, if complications do occur, they can be life threatening. Complications include:  A lung infection.  A stroke.  A heart attack.  Waking up during the procedure. When this occurs, the patient may be unable to move and communicate that he or she is awake. The patient may feel severe pain. Older adults and adults with serious medical problems are more likely to have complications than adults who are young and healthy. Some complications can be prevented by answering all of your caregiver's questions thoroughly and by following all pre-procedure instructions. It is important to tell your caregiver if any of the pre-procedure instructions, especially those related to diet, were not followed. Any food or liquid in the stomach can cause problems when you are under general anesthesia. BEFORE THE  PROCEDURE  Ask your caregiver if you will have to spend the night at the hospital. If you will not have to spend the night, arrange to have an adult drive you and stay with you for 24 hours.  Follow your caregiver's instructions if you are taking dietary supplements or medicines. Your caregiver may tell you to stop taking them or to reduce your dosage.  Do not smoke for as long as possible before your procedure. If possible, stop smoking 3-6 weeks before the procedure.  Do not take new dietary supplements or medicines within 1 week of your procedure unless your caregiver approves them.  Do not eat within 8 hours of your procedure or as directed by your caregiver. Drink only clear liquids, such as water, black coffee (without milk or cream), and fruit juices (without pulp).  Do not drink within 3 hours of your procedure or as directed by your caregiver.  You may brush your teeth on the morning of the procedure, but make sure to spit out the toothpaste and water when finished. PROCEDURE  You will receive anesthetics through a mask, through an intravenous (IV) access tube, or through both. A doctor who specializes in anesthesia (anesthesiologist) or a nurse who specializes in anesthesia (nurse anesthetist) or both will stay with you throughout the procedure to make sure you remain unconscious. He or she will also watch your blood pressure, pulse, and oxygen levels to make sure that the anesthetics do not cause any problems. Once you are asleep, a breathing tube or mask may be used to help you breathe. AFTER THE PROCEDURE You will wake up after the procedure is complete. You may be in the room where the procedure was performed or in a recovery area. You may have a sore throat if  a breathing tube was used. You may also feel:  Dizzy.  Weak.  Drowsy.  Confused.  Nauseous.  Cold. These are all normal responses and can be expected to last for up to 24 hours after the procedure is complete. A  caregiver will tell you when you are ready to go home. This will usually be when you are fully awake and in stable condition.   This information is not intended to replace advice given to you by your health care provider. Make sure you discuss any questions you have with your health care provider.   Document Released: 01/27/2008 Document Revised: 11/10/2014 Document Reviewed: 02/18/2012 Elsevier Interactive Patient Education [employer].

## 2016-05-12 NOTE — Anesthesia Postprocedure Evaluation (Signed)
Anesthesia Post Note  Patient: Allison Gould  Procedure(s) Performed: Procedure(s) (LRB): CATARACT EXTRACTION PHACO AND INTRAOCULAR LENS PLACEMENT (IOC) (Left)  Patient location during evaluation: Short Stay Anesthesia Type: MAC Level of consciousness: awake and alert Pain management: pain level controlled Vital Signs Assessment: post-procedure vital signs reviewed and stable Respiratory status: spontaneous breathing Cardiovascular status: blood pressure returned to baseline Postop Assessment: no signs of nausea or vomiting Anesthetic complications: no    Last Vitals:  Filed Vitals:   05/12/16 1016  BP: 135/59  Pulse: 60  Temp: 36.7 C  Resp: 22    Last Pain:  Filed Vitals:   05/12/16 1105  PainSc: 9                  Kimia Finan

## 2016-05-12 NOTE — Anesthesia Preprocedure Evaluation (Signed)
Anesthesia Evaluation  Patient identified by MRN, date of birth, ID band Patient awake    Reviewed: Allergy & Precautions, NPO status , Patient's Chart, lab work & pertinent test results  History of Anesthesia Complications (+) PONV  Airway Mallampati: II  TM Distance: >3 FB     Dental  (+) Edentulous Upper, Edentulous Lower   Pulmonary former smoker,    breath sounds clear to auscultation       Cardiovascular hypertension, Pt. on medications  Rhythm:Regular Rate:Normal     Neuro/Psych PSYCHIATRIC DISORDERS Anxiety    GI/Hepatic GERD  Medicated and Controlled,  Endo/Other  diabetes, Type 2, Oral Hypoglycemic Agents  Renal/GU      Musculoskeletal   Abdominal   Peds  Hematology   Anesthesia Other Findings   Reproductive/Obstetrics                             Anesthesia Physical Anesthesia Plan  ASA: III  Anesthesia Plan: MAC   Post-op Pain Management:    Induction: Intravenous  Airway Management Planned: Nasal Cannula  Additional Equipment:   Intra-op Plan:   Post-operative Plan:   Informed Consent: I have reviewed the patients History and Physical, chart, labs and discussed the procedure including the risks, benefits and alternatives for the proposed anesthesia with the patient or authorized representative who has indicated his/her understanding and acceptance.     Plan Discussed with:   Anesthesia Plan Comments:         Anesthesia Quick Evaluation

## 2016-05-12 NOTE — Transfer of Care (Signed)
Immediate Anesthesia Transfer of Care Note  Patient: Allison Gould  Procedure(s) Performed: Procedure(s) with comments: CATARACT EXTRACTION PHACO AND INTRAOCULAR LENS PLACEMENT (IOC) (Left) - CDE: 6.63  Patient Location: Short Stay  Anesthesia Type:MAC  Level of Consciousness: awake  Airway & Oxygen Therapy: Patient Spontanous Breathing  Post-op Assessment: Report given to RN  Post vital signs: Reviewed  Last Vitals:  Filed Vitals:   05/12/16 1016  BP: 135/59  Pulse: 60  Temp: 36.7 C  Resp: 22    Last Pain:  Filed Vitals:   05/12/16 1105  PainSc: 9          Complications: No apparent anesthesia complications

## 2016-05-12 NOTE — Op Note (Signed)
Date of Admission: 05/12/2016  Date of Surgery: 05/12/2016   Pre-Op Dx: Cataract Left Eye  Post-Op Dx: Senile Combined Cataract Left  Eye,  Dx Code [id number]  Surgeon: Tonny Branch, M.D.  Assistants: None  Anesthesia: Topical with MAC  Indications: Painless, progressive loss of vision with compromise of daily activities.  Surgery: Cataract Extraction with Intraocular lens Implant Left Eye  Discription: The patient had dilating drops and viscous lidocaine placed into the Left eye in the pre-op holding area. After transfer to the operating room, a time out was performed. The patient was then prepped and draped. Beginning with a 62 degree blade a paracentesis port was made at the surgeon's 2 o'clock position. The anterior chamber was then filled with 1% non-preserved lidocaine. This was followed by filling the anterior chamber with Provisc.  A 2.29mm keratome blade was used to make a clear corneal incision at the temporal limbus.  A bent cystatome needle was used to create a continuous tear capsulotomy. Hydrodissection was performed with balanced salt solution on a Fine canula. The lens nucleus was then removed using the phacoemulsification handpiece. Residual cortex was removed with the I&A handpiece. The anterior chamber and capsular bag were refilled with Provisc. A posterior chamber intraocular lens was placed into the capsular bag with it's injector. The implant was positioned with the Kuglan hook. The Provisc was then removed from the anterior chamber and capsular bag with the I&A handpiece. Stromal hydration of the main incision and paracentesis port was performed with BSS on a Fine canula. The wounds were tested for leak which was negative. The patient tolerated the procedure well. There were no operative complications. The patient was then transferred to the recovery room in stable condition.  Complications: None  Specimen: None  EBL: None  Prosthetic device: Hoya iSert 250, power 18.0 D, SN  [id number].

## 2016-05-14 DIAGNOSIS — M25569 Pain in unspecified knee: Secondary | ICD-10-CM | POA: Diagnosis not present

## 2016-05-14 DIAGNOSIS — E782 Mixed hyperlipidemia: Secondary | ICD-10-CM | POA: Diagnosis not present

## 2016-05-14 DIAGNOSIS — I10 Essential (primary) hypertension: Secondary | ICD-10-CM | POA: Diagnosis not present

## 2016-05-14 DIAGNOSIS — R944 Abnormal results of kidney function studies: Secondary | ICD-10-CM | POA: Diagnosis not present

## 2016-05-14 DIAGNOSIS — E119 Type 2 diabetes mellitus without complications: Secondary | ICD-10-CM | POA: Diagnosis not present

## 2016-05-15 ENCOUNTER — Encounter (HOSPITAL_COMMUNITY): Payer: Self-pay | Admitting: Ophthalmology

## 2016-05-26 DIAGNOSIS — H25811 Combined forms of age-related cataract, right eye: Secondary | ICD-10-CM | POA: Diagnosis not present

## 2016-05-27 ENCOUNTER — Encounter (HOSPITAL_COMMUNITY)
Admission: RE | Admit: 2016-05-27 | Discharge: 2016-05-27 | Disposition: A | Discharge status: Home / Self Care | Payer: Medicare Other | Source: Ambulatory Visit | Attending: Ophthalmology | Admitting: Ophthalmology

## 2016-05-28 DIAGNOSIS — L669 Cicatricial alopecia, unspecified: Secondary | ICD-10-CM | POA: Diagnosis not present

## 2016-05-28 DIAGNOSIS — L932 Other local lupus erythematosus: Secondary | ICD-10-CM | POA: Diagnosis not present

## 2016-05-28 DIAGNOSIS — Z87891 Personal history of nicotine dependence: Secondary | ICD-10-CM | POA: Diagnosis not present

## 2016-06-09 DIAGNOSIS — L669 Cicatricial alopecia, unspecified: Secondary | ICD-10-CM | POA: Diagnosis not present

## 2016-06-09 DIAGNOSIS — Z87891 Personal history of nicotine dependence: Secondary | ICD-10-CM | POA: Diagnosis not present

## 2016-06-09 DIAGNOSIS — L930 Discoid lupus erythematosus: Secondary | ICD-10-CM | POA: Diagnosis not present

## 2016-06-11 NOTE — Patient Instructions (Signed)
Allison Gould  06/11/2016     @PREFPERIOPPHARMACY @   Your procedure is scheduled on 06/16/2016.  Report to [facility] at 11:00 A.M.  Call this number if you have problems the morning of surgery:  [phone]   Remember:  Do not eat food or drink liquids after midnight.  Take these medicines the morning of surgery with A SIP OF WATER : NORVASC, FLEXERIL, LISINOPRIL AND PRILOSEC   Do not wear jewelry, make-up or nail polish.  Do not wear lotions, powders, or perfumes.  You may wear deoderant.  Do not shave 48 hours prior to surgery.  Men may shave face and neck.  Do not bring valuables to the hospital.  [facility] is not responsible for any belongings or valuables.  Contacts, dentures or bridgework may not be worn into surgery.  Leave your suitcase in the car.  After surgery it may be brought to your room.  For patients admitted to the hospital, discharge time will be determined by your treatment team.  Patients discharged the day of surgery will not be allowed to drive home.   Name and phone number of your driver:   FAMILY Special instructions:  N/A  Please read over the following fact sheets that you were given. Care and Recovery After Surgery     A cataract is a clouding of the lens of the eye. When a lens becomes cloudy, vision is reduced based on the degree and nature of the clouding. Surgery may be needed to improve vision. Surgery removes the cloudy lens and usually replaces it with a substitute lens (intraocular lens, IOL). LET YOUR EYE DOCTOR KNOW ABOUT:  Allergies to food or medicine.  Medicines taken including herbs, eye drops, over-the-counter medicines, and creams.  Use of steroids (by mouth or creams).  Previous problems with anesthetics or numbing medicine.  History of bleeding problems or blood clots.  Previous surgery.  Other health problems, including diabetes and kidney problems.  Possibility of pregnancy, if this applies. RISKS AND  COMPLICATIONS  Infection.  Inflammation of the eyeball (endophthalmitis) that can spread to both eyes (sympathetic ophthalmia).  Poor wound healing.  If an IOL is inserted, it can later fall out of proper position. This is very uncommon.  Clouding of the part of your eye that holds an IOL in place. This is called an "after-cataract." These are uncommon but easily treated. BEFORE THE PROCEDURE  Do not eat or drink anything except small amounts of water for 8 to 12 before your surgery, or as directed by your caregiver.  Unless you are told otherwise, continue any eye drops you have been prescribed.  Talk to your primary caregiver about all other medicines that you take (both prescription and nonprescription). In some cases, you may need to stop or change medicines near the time of your surgery. This is most important if you are taking blood-thinning medicine.Do not stop medicines unless you are told to do so.  Arrange for someone to drive you to and from the procedure.  Do not put contact lenses in either eye on the day of your surgery. PROCEDURE There is more than one method for safely removing a cataract. Your doctor can explain the differences and help determine which is best for you. Phacoemulsification surgery is the most common form of cataract surgery.  An injection is given behind the eye or eye drops are given to make this a painless procedure.  A small cut (incision) is made on the edge of the  clear, dome-shaped surface that covers the front of the eye (cornea).  A tiny probe is painlessly inserted into the eye. This device gives off ultrasound waves that soften and break up the cloudy center of the lens. This makes it easier for the cloudy lens to be removed by suction.  An IOL may be implanted.  The normal lens of the eye is covered by a clear capsule. Part of that capsule is intentionally left in the eye to support the IOL.  Your surgeon may or may not use stitches to  close the incision. There are other forms of cataract surgery that require a larger incision and stitches to close the eye. This approach is taken in cases where the doctor feels that the cataract cannot be easily removed using phacoemulsification. AFTER THE PROCEDURE  When an IOL is implanted, it does not need care. It becomes a permanent part of your eye and cannot be seen or felt.  Your doctor will schedule follow-up exams to check on your progress.  Review your other medicines with your doctor to see which can be resumed after surgery.  Use eye drops or take medicine as prescribed by your doctor.   This information is not intended to replace advice given to you by your health care provider. Make sure you discuss any questions you have with your health care provider.   Document Released: 10/09/2011 Document Revised: 11/10/2014 Document Reviewed: 10/09/2011 Elsevier Interactive Patient Education [employer].

## 2016-06-12 ENCOUNTER — Encounter (HOSPITAL_COMMUNITY): Payer: Self-pay

## 2016-06-12 ENCOUNTER — Encounter (HOSPITAL_COMMUNITY)
Admission: RE | Admit: 2016-06-12 | Discharge: 2016-06-12 | Disposition: A | Discharge status: Home / Self Care | Payer: Medicare Other | Source: Ambulatory Visit | Attending: Ophthalmology | Admitting: Ophthalmology

## 2016-06-16 ENCOUNTER — Ambulatory Visit (HOSPITAL_COMMUNITY): Payer: Medicare Other | Admitting: Anesthesiology

## 2016-06-16 ENCOUNTER — Encounter (HOSPITAL_COMMUNITY)
Admission: RE | Disposition: A | Discharge status: Home / Self Care | Payer: Self-pay | Source: Ambulatory Visit | Attending: Ophthalmology

## 2016-06-16 ENCOUNTER — Ambulatory Visit (HOSPITAL_COMMUNITY)
Admission: RE | Admit: 2016-06-16 | Discharge: 2016-06-16 | Disposition: A | Discharge status: Home / Self Care | Payer: Medicare Other | Source: Ambulatory Visit | Attending: Ophthalmology | Admitting: Ophthalmology

## 2016-06-16 ENCOUNTER — Encounter (HOSPITAL_COMMUNITY): Payer: Self-pay | Admitting: *Deleted

## 2016-06-16 DIAGNOSIS — K219 Gastro-esophageal reflux disease without esophagitis: Secondary | ICD-10-CM | POA: Insufficient documentation

## 2016-06-16 DIAGNOSIS — F419 Anxiety disorder, unspecified: Secondary | ICD-10-CM | POA: Insufficient documentation

## 2016-06-16 DIAGNOSIS — Z87891 Personal history of nicotine dependence: Secondary | ICD-10-CM | POA: Diagnosis not present

## 2016-06-16 DIAGNOSIS — H269 Unspecified cataract: Secondary | ICD-10-CM | POA: Diagnosis not present

## 2016-06-16 DIAGNOSIS — M1990 Unspecified osteoarthritis, unspecified site: Secondary | ICD-10-CM | POA: Diagnosis not present

## 2016-06-16 DIAGNOSIS — H25811 Combined forms of age-related cataract, right eye: Secondary | ICD-10-CM | POA: Diagnosis not present

## 2016-06-16 DIAGNOSIS — I10 Essential (primary) hypertension: Secondary | ICD-10-CM | POA: Diagnosis not present

## 2016-06-16 DIAGNOSIS — E119 Type 2 diabetes mellitus without complications: Secondary | ICD-10-CM | POA: Insufficient documentation

## 2016-06-16 HISTORY — PX: CATARACT EXTRACTION W/PHACO: SHX586

## 2016-06-16 LAB — GLUCOSE, CAPILLARY: GLUCOSE-CAPILLARY: 102 mg/dL — AB (ref 65–99)

## 2016-06-16 SURGERY — PHACOEMULSIFICATION, CATARACT, WITH IOL INSERTION
Anesthesia: Monitor Anesthesia Care | Site: Eye | Laterality: Right

## 2016-06-16 MED ORDER — LIDOCAINE HCL 3.5 % OP GEL
1.0000 "application " | Freq: Once | OPHTHALMIC | Status: AC
  Administered 2016-06-16: 1 via OPHTHALMIC

## 2016-06-16 MED ORDER — POVIDONE-IODINE 5 % OP SOLN
OPHTHALMIC | Status: DC | PRN
  Administered 2016-06-16: 1 via OPHTHALMIC

## 2016-06-16 MED ORDER — PROVISC 10 MG/ML IO SOLN
INTRAOCULAR | Status: DC | PRN
  Administered 2016-06-16: 0.85 mL via INTRAOCULAR

## 2016-06-16 MED ORDER — MIDAZOLAM HCL 2 MG/2ML IJ SOLN
INTRAMUSCULAR | Status: AC
  Filled 2016-06-16: qty 2

## 2016-06-16 MED ORDER — MIDAZOLAM HCL 2 MG/2ML IJ SOLN
1.0000 mg | INTRAMUSCULAR | Status: DC | PRN
  Administered 2016-06-16: 2 mg via INTRAVENOUS

## 2016-06-16 MED ORDER — LIDOCAINE 3.5 % OP GEL OPTIME - NO CHARGE
OPHTHALMIC | Status: DC | PRN
  Administered 2016-06-16: 2 [drp] via OPHTHALMIC

## 2016-06-16 MED ORDER — LACTATED RINGERS IV SOLN
INTRAVENOUS | Status: DC
  Administered 2016-06-16: 11:00:00 via INTRAVENOUS

## 2016-06-16 MED ORDER — FENTANYL CITRATE (PF) 100 MCG/2ML IJ SOLN
25.0000 ug | INTRAMUSCULAR | Status: AC | PRN
  Administered 2016-06-16 (×2): 25 ug via INTRAVENOUS

## 2016-06-16 MED ORDER — TETRACAINE HCL 0.5 % OP SOLN
1.0000 [drp] | OPHTHALMIC | Status: AC
  Administered 2016-06-16 (×3): 1 [drp] via OPHTHALMIC

## 2016-06-16 MED ORDER — PHENYLEPHRINE HCL 2.5 % OP SOLN
1.0000 [drp] | OPHTHALMIC | Status: AC
  Administered 2016-06-16 (×3): 1 [drp] via OPHTHALMIC

## 2016-06-16 MED ORDER — NEOMYCIN-POLYMYXIN-DEXAMETH 3.5-10000-0.1 OP SUSP
OPHTHALMIC | Status: DC | PRN
  Administered 2016-06-16: 2 [drp] via OPHTHALMIC

## 2016-06-16 MED ORDER — BSS IO SOLN
INTRAOCULAR | Status: DC | PRN
  Administered 2016-06-16: 15 mL via INTRAOCULAR

## 2016-06-16 MED ORDER — LIDOCAINE HCL (PF) 1 % IJ SOLN
INTRAMUSCULAR | Status: DC | PRN
  Administered 2016-06-16: .5 mL

## 2016-06-16 MED ORDER — MIDAZOLAM HCL 5 MG/5ML IJ SOLN
INTRAMUSCULAR | Status: DC | PRN
  Administered 2016-06-16: 1 mg via INTRAVENOUS

## 2016-06-16 MED ORDER — FENTANYL CITRATE (PF) 100 MCG/2ML IJ SOLN
INTRAMUSCULAR | Status: AC
  Filled 2016-06-16: qty 2

## 2016-06-16 MED ORDER — EPINEPHRINE HCL 1 MG/ML IJ SOLN
INTRAMUSCULAR | Status: DC | PRN
  Administered 2016-06-16: 500 mL

## 2016-06-16 MED ORDER — CYCLOPENTOLATE-PHENYLEPHRINE 0.2-1 % OP SOLN
1.0000 [drp] | OPHTHALMIC | Status: AC
  Administered 2016-06-16 (×3): 1 [drp] via OPHTHALMIC

## 2016-06-16 SURGICAL SUPPLY — 23 items
CAPSULAR TENSION RING-AMO (OPHTHALMIC RELATED) IMPLANT
CLOTH BEACON ORANGE TIMEOUT ST (SAFETY) ×2 IMPLANT
EYE SHIELD UNIVERSAL CLEAR (GAUZE/BANDAGES/DRESSINGS) ×2 IMPLANT
GLOVE BIOGEL PI IND STRL 7.0 (GLOVE) IMPLANT
GLOVE BIOGEL PI IND STRL 7.5 (GLOVE) IMPLANT
GLOVE BIOGEL PI INDICATOR 7.0 (GLOVE) ×2
GLOVE BIOGEL PI INDICATOR 7.5 (GLOVE) ×2
GLOVE EXAM NITRILE LRG STRL (GLOVE) IMPLANT
GLOVE EXAM NITRILE MD LF STRL (GLOVE) IMPLANT
KIT VITRECTOMY (OPHTHALMIC RELATED) IMPLANT
PAD ARMBOARD 7.5X6 YLW CONV (MISCELLANEOUS) ×2 IMPLANT
PROC W NO LENS (INTRAOCULAR LENS)
PROC W SPEC LENS (INTRAOCULAR LENS)
PROCESS W NO LENS (INTRAOCULAR LENS) IMPLANT
PROCESS W SPEC LENS (INTRAOCULAR LENS) IMPLANT
RETRACTOR IRIS SIGHTPATH (OPHTHALMIC RELATED) IMPLANT
RING MALYGIN (MISCELLANEOUS) IMPLANT
SIGHTPATH CAT PROC W REG LENS (Ophthalmic Related) ×3 IMPLANT
SYRINGE LUER LOK 1CC (MISCELLANEOUS) ×2 IMPLANT
TAPE SURG TRANSPORE 1 IN (GAUZE/BANDAGES/DRESSINGS) IMPLANT
TAPE SURGICAL TRANSPORE 1 IN (GAUZE/BANDAGES/DRESSINGS) ×2
VISCOELASTIC ADDITIONAL (OPHTHALMIC RELATED) IMPLANT
WATER STERILE IRR 250ML POUR (IV SOLUTION) ×2 IMPLANT

## 2016-06-16 NOTE — Transfer of Care (Signed)
Immediate Anesthesia Transfer of Care Note  Patient: Allison Gould  Procedure(s) Performed: Procedure(s): CATARACT EXTRACTION PHACO AND INTRAOCULAR LENS PLACEMENT (IOC); CDE:  4.13 (Right)  Patient Location: Short Stay  Anesthesia Type:MAC  Level of Consciousness: awake  Airway & Oxygen Therapy: Patient Spontanous Breathing  Post-op Assessment: Report given to RN  Post vital signs: Reviewed  Last Vitals:  Vitals:   06/16/16 1125 06/16/16 1130  BP: (!) 127/51 107/65  Resp: (!) 53 18  Temp:      Last Pain:  Vitals:   06/16/16 1032  TempSrc: Oral  PainSc: 7       Patients Stated Pain Goal: 9 ([id number])  Complications: No apparent anesthesia complications

## 2016-06-16 NOTE — Anesthesia Postprocedure Evaluation (Signed)
Anesthesia Post Note  Patient: Allison Gould  Procedure(s) Performed: Procedure(s) (LRB): CATARACT EXTRACTION PHACO AND INTRAOCULAR LENS PLACEMENT (IOC); CDE:  4.13 (Right)  Patient location during evaluation: Short Stay Anesthesia Type: MAC Level of consciousness: awake and alert Pain management: pain level controlled Vital Signs Assessment: post-procedure vital signs reviewed and stable Respiratory status: spontaneous breathing Cardiovascular status: blood pressure returned to baseline Postop Assessment: no signs of nausea or vomiting Anesthetic complications: no    Last Vitals:  Vitals:   06/16/16 1125 06/16/16 1130  BP: (!) 127/51 107/65  Resp: (!) 53 18  Temp:      Last Pain:  Vitals:   06/16/16 1032  TempSrc: Oral  PainSc: 7                  Kaiyana Bedore

## 2016-06-16 NOTE — Discharge Instructions (Signed)

## 2016-06-16 NOTE — Op Note (Signed)
Date of Admission: 06/16/2016  Date of Surgery: 06/16/2016   Pre-Op Dx: Cataract Right Eye  Post-Op Dx: Senile Combined Cataract Right  Eye,  Dx Code [id number]  Surgeon: Tonny Branch, M.D.  Assistants: None  Anesthesia: Topical with MAC  Indications: Painless, progressive loss of vision with compromise of daily activities.  Surgery: Cataract Extraction with Intraocular lens Implant Right Eye  Discription: The patient had dilating drops and viscous lidocaine placed into the Right eye in the pre-op holding area. After transfer to the operating room, a time out was performed. The patient was then prepped and draped. Beginning with a 32 degree blade a paracentesis port was made at the surgeon's 2 o'clock position. The anterior chamber was then filled with 1% non-preserved lidocaine. This was followed by filling the anterior chamber with Provisc.  A 2.46mm keratome blade was used to make a clear corneal incision at the temporal limbus.  A bent cystatome needle was used to create a continuous tear capsulotomy. Hydrodissection was performed with balanced salt solution on a Fine canula. The lens nucleus was then removed using the phacoemulsification handpiece. Residual cortex was removed with the I&A handpiece. The anterior chamber and capsular bag were refilled with Provisc. A posterior chamber intraocular lens was placed into the capsular bag with it's injector. The implant was positioned with the Kuglan hook. The Provisc was then removed from the anterior chamber and capsular bag with the I&A handpiece. Stromal hydration of the main incision and paracentesis port was performed with BSS on a Fine canula. The wounds were tested for leak which was negative. The patient tolerated the procedure well. There were no operative complications. The patient was then transferred to the recovery room in stable condition.  Complications: None  Specimen: None  EBL: None  Prosthetic device: Hoya iSert 250, power 17.0  D, SN [id number].

## 2016-06-16 NOTE — Anesthesia Preprocedure Evaluation (Signed)
Anesthesia Evaluation  Patient identified by MRN, date of birth, ID band Patient awake    Reviewed: Allergy & Precautions, NPO status , Patient's Chart, lab work & pertinent test results  History of Anesthesia Complications (+) PONV  Airway Mallampati: II  TM Distance: >3 FB     Dental  (+) Edentulous Upper, Edentulous Lower   Pulmonary former smoker,    breath sounds clear to auscultation       Cardiovascular hypertension, Pt. on medications  Rhythm:Regular Rate:Normal     Neuro/Psych PSYCHIATRIC DISORDERS Anxiety    GI/Hepatic GERD  Medicated and Controlled,  Endo/Other  diabetes, Type 2, Oral Hypoglycemic Agents  Renal/GU      Musculoskeletal   Abdominal   Peds  Hematology   Anesthesia Other Findings   Reproductive/Obstetrics                             Anesthesia Physical Anesthesia Plan  ASA: III  Anesthesia Plan: MAC   Post-op Pain Management:    Induction: Intravenous  Airway Management Planned: Nasal Cannula  Additional Equipment:   Intra-op Plan:   Post-operative Plan:   Informed Consent: I have reviewed the patients History and Physical, chart, labs and discussed the procedure including the risks, benefits and alternatives for the proposed anesthesia with the patient or authorized representative who has indicated his/her understanding and acceptance.     Plan Discussed with:   Anesthesia Plan Comments:         Anesthesia Quick Evaluation

## 2016-06-16 NOTE — H&P (Signed)
I have reviewed the H&P, the patient was re-examined, and I have identified no interval changes in medical condition and plan of care since the history and physical of record  

## 2016-06-19 ENCOUNTER — Encounter (HOSPITAL_COMMUNITY): Payer: Self-pay | Admitting: Ophthalmology

## 2016-06-23 DIAGNOSIS — Z0000 Encounter for general adult medical examination without abnormal findings: Secondary | ICD-10-CM | POA: Diagnosis not present

## 2016-06-23 DIAGNOSIS — Z9849 Cataract extraction status, unspecified eye: Secondary | ICD-10-CM | POA: Diagnosis not present

## 2016-06-23 DIAGNOSIS — L930 Discoid lupus erythematosus: Secondary | ICD-10-CM | POA: Diagnosis not present

## 2016-09-05 DIAGNOSIS — I10 Essential (primary) hypertension: Secondary | ICD-10-CM | POA: Diagnosis not present

## 2016-09-05 DIAGNOSIS — Z1159 Encounter for screening for other viral diseases: Secondary | ICD-10-CM | POA: Diagnosis not present

## 2016-09-05 DIAGNOSIS — E119 Type 2 diabetes mellitus without complications: Secondary | ICD-10-CM | POA: Diagnosis not present

## 2016-09-08 DIAGNOSIS — Z23 Encounter for immunization: Secondary | ICD-10-CM | POA: Diagnosis not present

## 2016-09-08 DIAGNOSIS — I10 Essential (primary) hypertension: Secondary | ICD-10-CM | POA: Diagnosis not present

## 2016-09-08 DIAGNOSIS — Z9849 Cataract extraction status, unspecified eye: Secondary | ICD-10-CM | POA: Diagnosis not present

## 2016-09-08 DIAGNOSIS — R944 Abnormal results of kidney function studies: Secondary | ICD-10-CM | POA: Diagnosis not present

## 2016-09-08 DIAGNOSIS — E782 Mixed hyperlipidemia: Secondary | ICD-10-CM | POA: Diagnosis not present

## 2016-09-08 DIAGNOSIS — E119 Type 2 diabetes mellitus without complications: Secondary | ICD-10-CM | POA: Diagnosis not present

## 2016-12-13 DIAGNOSIS — M1711 Unilateral primary osteoarthritis, right knee: Secondary | ICD-10-CM | POA: Diagnosis not present

## 2016-12-13 DIAGNOSIS — M25561 Pain in right knee: Secondary | ICD-10-CM | POA: Diagnosis not present

## 2016-12-31 DIAGNOSIS — E782 Mixed hyperlipidemia: Secondary | ICD-10-CM | POA: Diagnosis not present

## 2016-12-31 DIAGNOSIS — E119 Type 2 diabetes mellitus without complications: Secondary | ICD-10-CM | POA: Diagnosis not present

## 2016-12-31 DIAGNOSIS — D509 Iron deficiency anemia, unspecified: Secondary | ICD-10-CM | POA: Diagnosis not present

## 2017-01-02 DIAGNOSIS — E1122 Type 2 diabetes mellitus with diabetic chronic kidney disease: Secondary | ICD-10-CM | POA: Diagnosis not present

## 2017-01-02 DIAGNOSIS — D509 Iron deficiency anemia, unspecified: Secondary | ICD-10-CM | POA: Diagnosis not present

## 2017-01-02 DIAGNOSIS — I10 Essential (primary) hypertension: Secondary | ICD-10-CM | POA: Diagnosis not present

## 2017-01-02 DIAGNOSIS — M1711 Unilateral primary osteoarthritis, right knee: Secondary | ICD-10-CM | POA: Diagnosis not present

## 2017-01-02 DIAGNOSIS — E782 Mixed hyperlipidemia: Secondary | ICD-10-CM | POA: Diagnosis not present

## 2017-01-29 DIAGNOSIS — M25562 Pain in left knee: Secondary | ICD-10-CM | POA: Diagnosis not present

## 2017-01-29 DIAGNOSIS — M25519 Pain in unspecified shoulder: Secondary | ICD-10-CM | POA: Diagnosis not present

## 2017-01-29 DIAGNOSIS — S7012XA Contusion of left thigh, initial encounter: Secondary | ICD-10-CM | POA: Diagnosis not present

## 2017-01-29 DIAGNOSIS — M79603 Pain in arm, unspecified: Secondary | ICD-10-CM | POA: Diagnosis not present

## 2017-01-29 DIAGNOSIS — M79605 Pain in left leg: Secondary | ICD-10-CM | POA: Diagnosis not present

## 2017-02-05 DIAGNOSIS — M1712 Unilateral primary osteoarthritis, left knee: Secondary | ICD-10-CM | POA: Diagnosis not present

## 2017-05-15 DIAGNOSIS — M25561 Pain in right knee: Secondary | ICD-10-CM | POA: Diagnosis not present

## 2017-05-15 DIAGNOSIS — M1711 Unilateral primary osteoarthritis, right knee: Secondary | ICD-10-CM | POA: Diagnosis not present

## 2017-05-18 DIAGNOSIS — L669 Cicatricial alopecia, unspecified: Secondary | ICD-10-CM | POA: Diagnosis not present

## 2017-05-18 DIAGNOSIS — L930 Discoid lupus erythematosus: Secondary | ICD-10-CM | POA: Diagnosis not present

## 2017-07-07 DIAGNOSIS — I10 Essential (primary) hypertension: Secondary | ICD-10-CM | POA: Diagnosis not present

## 2017-07-07 DIAGNOSIS — E1122 Type 2 diabetes mellitus with diabetic chronic kidney disease: Secondary | ICD-10-CM | POA: Diagnosis not present

## 2017-07-07 DIAGNOSIS — D509 Iron deficiency anemia, unspecified: Secondary | ICD-10-CM | POA: Diagnosis not present

## 2017-07-09 DIAGNOSIS — N182 Chronic kidney disease, stage 2 (mild): Secondary | ICD-10-CM | POA: Diagnosis not present

## 2017-07-09 DIAGNOSIS — I10 Essential (primary) hypertension: Secondary | ICD-10-CM | POA: Diagnosis not present

## 2017-07-09 DIAGNOSIS — Z0000 Encounter for general adult medical examination without abnormal findings: Secondary | ICD-10-CM | POA: Diagnosis not present

## 2017-07-09 DIAGNOSIS — E1122 Type 2 diabetes mellitus with diabetic chronic kidney disease: Secondary | ICD-10-CM | POA: Diagnosis not present

## 2017-07-09 DIAGNOSIS — E782 Mixed hyperlipidemia: Secondary | ICD-10-CM | POA: Diagnosis not present

## 2018-01-05 DIAGNOSIS — E782 Mixed hyperlipidemia: Secondary | ICD-10-CM | POA: Diagnosis not present

## 2018-01-05 DIAGNOSIS — I10 Essential (primary) hypertension: Secondary | ICD-10-CM | POA: Diagnosis not present

## 2018-01-05 DIAGNOSIS — D509 Iron deficiency anemia, unspecified: Secondary | ICD-10-CM | POA: Diagnosis not present

## 2018-01-05 DIAGNOSIS — E1122 Type 2 diabetes mellitus with diabetic chronic kidney disease: Secondary | ICD-10-CM | POA: Diagnosis not present

## 2018-01-07 DIAGNOSIS — I10 Essential (primary) hypertension: Secondary | ICD-10-CM | POA: Diagnosis not present

## 2018-01-07 DIAGNOSIS — K219 Gastro-esophageal reflux disease without esophagitis: Secondary | ICD-10-CM | POA: Diagnosis not present

## 2018-01-07 DIAGNOSIS — N189 Chronic kidney disease, unspecified: Secondary | ICD-10-CM | POA: Diagnosis not present

## 2018-01-07 DIAGNOSIS — E0829 Diabetes mellitus due to underlying condition with other diabetic kidney complication: Secondary | ICD-10-CM | POA: Diagnosis not present

## 2018-03-17 ENCOUNTER — Other Ambulatory Visit: Payer: Self-pay | Admitting: Internal Medicine

## 2018-03-17 DIAGNOSIS — Z1231 Encounter for screening mammogram for malignant neoplasm of breast: Secondary | ICD-10-CM

## 2018-04-12 ENCOUNTER — Ambulatory Visit (HOSPITAL_COMMUNITY)
Admission: RE | Admit: 2018-04-12 | Discharge: 2018-04-12 | Disposition: A | Discharge status: Home / Self Care | Payer: Medicare Other | Source: Ambulatory Visit | Attending: Internal Medicine | Admitting: Internal Medicine

## 2018-04-12 ENCOUNTER — Ambulatory Visit: Payer: Self-pay

## 2018-04-12 ENCOUNTER — Encounter (HOSPITAL_COMMUNITY): Payer: Self-pay

## 2018-04-12 DIAGNOSIS — Z1231 Encounter for screening mammogram for malignant neoplasm of breast: Secondary | ICD-10-CM | POA: Insufficient documentation

## 2018-04-24 DIAGNOSIS — E782 Mixed hyperlipidemia: Secondary | ICD-10-CM | POA: Diagnosis not present

## 2018-04-24 DIAGNOSIS — D509 Iron deficiency anemia, unspecified: Secondary | ICD-10-CM | POA: Diagnosis not present

## 2018-04-24 DIAGNOSIS — E119 Type 2 diabetes mellitus without complications: Secondary | ICD-10-CM | POA: Diagnosis not present

## 2018-04-24 DIAGNOSIS — E039 Hypothyroidism, unspecified: Secondary | ICD-10-CM | POA: Diagnosis not present

## 2018-04-24 DIAGNOSIS — E0829 Diabetes mellitus due to underlying condition with other diabetic kidney complication: Secondary | ICD-10-CM | POA: Diagnosis not present

## 2018-04-24 DIAGNOSIS — I10 Essential (primary) hypertension: Secondary | ICD-10-CM | POA: Diagnosis not present

## 2018-04-26 DIAGNOSIS — E1122 Type 2 diabetes mellitus with diabetic chronic kidney disease: Secondary | ICD-10-CM | POA: Diagnosis not present

## 2018-04-26 DIAGNOSIS — N189 Chronic kidney disease, unspecified: Secondary | ICD-10-CM | POA: Diagnosis not present

## 2018-04-26 DIAGNOSIS — M329 Systemic lupus erythematosus, unspecified: Secondary | ICD-10-CM | POA: Diagnosis not present

## 2018-04-26 DIAGNOSIS — I10 Essential (primary) hypertension: Secondary | ICD-10-CM | POA: Diagnosis not present

## 2018-04-26 DIAGNOSIS — Z0000 Encounter for general adult medical examination without abnormal findings: Secondary | ICD-10-CM | POA: Diagnosis not present

## 2018-04-27 ENCOUNTER — Other Ambulatory Visit: Payer: Self-pay | Admitting: Internal Medicine

## 2018-04-27 DIAGNOSIS — Z780 Asymptomatic menopausal state: Secondary | ICD-10-CM

## 2018-05-26 ENCOUNTER — Ambulatory Visit (HOSPITAL_COMMUNITY)
Admission: RE | Admit: 2018-05-26 | Discharge: 2018-05-26 | Disposition: A | Discharge status: Home / Self Care | Payer: Medicare Other | Source: Ambulatory Visit | Attending: Internal Medicine | Admitting: Internal Medicine

## 2018-05-26 DIAGNOSIS — Z780 Asymptomatic menopausal state: Secondary | ICD-10-CM | POA: Diagnosis not present

## 2018-05-26 DIAGNOSIS — Z13820 Encounter for screening for osteoporosis: Secondary | ICD-10-CM | POA: Diagnosis not present

## 2018-07-27 DIAGNOSIS — D509 Iron deficiency anemia, unspecified: Secondary | ICD-10-CM | POA: Diagnosis not present

## 2018-07-27 DIAGNOSIS — I10 Essential (primary) hypertension: Secondary | ICD-10-CM | POA: Diagnosis not present

## 2018-07-27 DIAGNOSIS — N189 Chronic kidney disease, unspecified: Secondary | ICD-10-CM | POA: Diagnosis not present

## 2018-07-27 DIAGNOSIS — E1122 Type 2 diabetes mellitus with diabetic chronic kidney disease: Secondary | ICD-10-CM | POA: Diagnosis not present

## 2018-07-30 DIAGNOSIS — Z0000 Encounter for general adult medical examination without abnormal findings: Secondary | ICD-10-CM | POA: Diagnosis not present

## 2018-07-30 DIAGNOSIS — N189 Chronic kidney disease, unspecified: Secondary | ICD-10-CM | POA: Diagnosis not present

## 2018-07-30 DIAGNOSIS — R6889 Other general symptoms and signs: Secondary | ICD-10-CM | POA: Diagnosis not present

## 2018-07-30 DIAGNOSIS — M329 Systemic lupus erythematosus, unspecified: Secondary | ICD-10-CM | POA: Diagnosis not present

## 2018-07-30 DIAGNOSIS — E1122 Type 2 diabetes mellitus with diabetic chronic kidney disease: Secondary | ICD-10-CM | POA: Diagnosis not present

## 2018-07-30 DIAGNOSIS — I10 Essential (primary) hypertension: Secondary | ICD-10-CM | POA: Diagnosis not present

## 2018-08-18 DIAGNOSIS — L930 Discoid lupus erythematosus: Secondary | ICD-10-CM | POA: Diagnosis not present

## 2018-08-18 DIAGNOSIS — L669 Cicatricial alopecia, unspecified: Secondary | ICD-10-CM | POA: Diagnosis not present

## 2018-08-18 DIAGNOSIS — R6889 Other general symptoms and signs: Secondary | ICD-10-CM | POA: Diagnosis not present

## 2018-11-24 DIAGNOSIS — E1122 Type 2 diabetes mellitus with diabetic chronic kidney disease: Secondary | ICD-10-CM | POA: Diagnosis not present

## 2018-11-24 DIAGNOSIS — D509 Iron deficiency anemia, unspecified: Secondary | ICD-10-CM | POA: Diagnosis not present

## 2018-11-24 DIAGNOSIS — I10 Essential (primary) hypertension: Secondary | ICD-10-CM | POA: Diagnosis not present

## 2018-11-30 DIAGNOSIS — E1122 Type 2 diabetes mellitus with diabetic chronic kidney disease: Secondary | ICD-10-CM | POA: Diagnosis not present

## 2018-11-30 DIAGNOSIS — N182 Chronic kidney disease, stage 2 (mild): Secondary | ICD-10-CM | POA: Diagnosis not present

## 2018-11-30 DIAGNOSIS — I10 Essential (primary) hypertension: Secondary | ICD-10-CM | POA: Diagnosis not present

## 2018-11-30 DIAGNOSIS — M329 Systemic lupus erythematosus, unspecified: Secondary | ICD-10-CM | POA: Diagnosis not present

## 2018-11-30 DIAGNOSIS — Z23 Encounter for immunization: Secondary | ICD-10-CM | POA: Diagnosis not present

## 2018-12-22 ENCOUNTER — Ambulatory Visit (INDEPENDENT_AMBULATORY_CARE_PROVIDER_SITE_OTHER): Payer: Medicare Other

## 2018-12-22 ENCOUNTER — Ambulatory Visit: Payer: Medicare Other | Admitting: Orthopedic Surgery

## 2018-12-22 ENCOUNTER — Encounter: Payer: Self-pay | Admitting: Orthopedic Surgery

## 2018-12-22 VITALS — BP 129/73 | HR 83 | Ht 65.0 in | Wt 308.0 lb

## 2018-12-22 DIAGNOSIS — M7661 Achilles tendinitis, right leg: Secondary | ICD-10-CM | POA: Diagnosis not present

## 2018-12-22 DIAGNOSIS — M25571 Pain in right ankle and joints of right foot: Secondary | ICD-10-CM | POA: Diagnosis not present

## 2018-12-22 DIAGNOSIS — Z6841 Body Mass Index (BMI) 40.0 and over, adult: Secondary | ICD-10-CM | POA: Diagnosis not present

## 2018-12-22 NOTE — Patient Instructions (Addendum)
Ankle stretching and strengthening exercises  Continue the meloxicam that Dr. Nevada Crane prescribed  Apply ice to the area 20 to 30 minutes 3 times a day   Achilles Tendinitis  Achilles tendinitis is inflammation of the tough, cord-like band that attaches the lower leg muscles to the heel bone (Achilles tendon). This is usually caused by overusing the tendon and the ankle joint. Achilles tendinitis usually gets better over time with treatment and caring for yourself at home. It can take weeks or months to heal completely. What are the causes? This condition may be caused by:  A sudden increase in exercise or activity, such as running.  Doing the same exercises or activities (such as jumping) over and over.  Not warming up calf muscles before exercising.  Exercising in shoes that are worn out or not made for exercise.  Having arthritis or a bone growth (spur) on the back of the heel bone. This can rub against the tendon and hurt it.  Age-related wear and tear. Tendons become less flexible with age and more likely to be injured. What are the signs or symptoms? Common symptoms of this condition include:  Pain in the Achilles tendon or in the back of the leg, just above the heel. The pain usually gets worse with exercise.  Stiffness or soreness in the back of the leg, especially in the morning.  Swelling of the skin over the Achilles tendon.  Thickening of the tendon.  Bone spurs at the bottom of the Achilles tendon, near the heel.  Trouble standing on tiptoe. How is this diagnosed? This condition is diagnosed based on your symptoms and a physical exam. You may have tests, including:  X-rays.  MRI. How is this treated? The goal of treatment is to relieve symptoms and help your injury heal. Treatment may include:  Decreasing or stopping activities that caused the tendinitis. This may mean switching to low-impact exercises like biking or swimming.  Icing the injured  area.  Doing physical therapy, including strengthening and stretching exercises.  NSAIDs to help relieve pain and swelling.  Using supportive shoes, wraps, heel lifts, or a walking boot (air cast).  Surgery. This may be done if your symptoms do not improve after 6 months.  Using high-energy shock wave impulses to stimulate the healing process (extracorporeal shock wave therapy). This is rare.  Injection of medicines to help relieve inflammation (corticosteroids). This is rare. Follow these instructions at home: If you have an air cast:  Wear the cast as told by your health care provider. Remove it only as told by your health care provider.  Loosen the cast if your toes tingle, become numb, or turn cold and blue. Activity  Gradually return to your normal activities once your health care provider approves. Do not do activities that cause pain. ? Consider doing low-impact exercises, like cycling or swimming.  If you have an air cast, ask your health care provider when it is safe for you to drive.  If physical therapy was prescribed, do exercises as told by your health care provider or physical therapist. Managing pain, stiffness, and swelling   Raise (elevate) your foot above the level of your heart while you are sitting or lying down.  Move your toes often to avoid stiffness and to lessen swelling.  If directed, put ice on the injured area: ? Put ice in a plastic bag. ? Place a towel between your skin and the bag. ? Leave the ice on for 20 minutes, 2-3 times  a day General instructions  If directed, wrap your foot with an elastic bandage or other wrap. This can help keep your tendon from moving too much while it heals. Your health care provider will show you how to wrap your foot correctly.  Wear supportive shoes or heel lifts only as told by your health care provider.  Take over-the-counter and prescription medicines only as told by your health care provider.  Keep all  follow-up visits as told by your health care provider. This is important. Contact a health care provider if:  You have symptoms that gets worse.  You have pain that does not get better with medicine.  You develop new, unexplained symptoms.  You develop warmth and swelling in your foot.  You have a fever. Get help right away if:  You have a sudden popping sound or sensation in your Achilles tendon followed by severe pain.  You cannot move your toes or foot.  You cannot put any weight on your foot. Summary  Achilles tendinitis is inflammation of the tough, cord-like band that attaches the lower leg muscles to the heel bone (Achilles tendon).  This condition is usually caused by overusing the tendon and the ankle joint. It can also be caused by arthritis or normal aging.  The most common symptoms of this condition include pain, swelling, or stiffness in the Achilles tendon or in the back of the leg.  This condition is usually treated with rest, NSAIDs, and physical therapy. This information is not intended to replace advice given to you by your health care provider. Make sure you discuss any questions you have with your health care provider. Document Released: 07/30/2005 Document Revised: 09/08/2016 Document Reviewed: 09/08/2016 Elsevier Interactive Patient Education  2019 [employer].   [employer] loss try to work on losing weight

## 2018-12-22 NOTE — Progress Notes (Signed)
NEW PATIENT OFFICE VISIT  Chief Complaint  Patient presents with  . Ankle Pain    right     68 year old female presents with 2-week history of pain in the right Achilles.  She denies any trauma.  She says she has sharp and occasionally dull pain in the posterior aspect of her foot and ankle near the Achilles insertion which she rates as 7-8 out of 10.  She has gotten some relief with meloxicam which she just started and says that the pain is starting to ease off.   Review of Systems  Constitutional: Negative for fever.  Musculoskeletal: Positive for back pain.  Skin: Negative.   Neurological: Positive for tingling.     Past Medical History:  Diagnosis Date  . Anxiety   . Diabetes mellitus type II   . GERD (gastroesophageal reflux disease)   . Hearing aid worn    right  . Hypertension   . Lipid disorder   . Loss of hair    for unknown reason, to see MD   . Low back pain   . Morbid obesity with BMI of 45.0-49.9, adult ([place])   . Osteoarthritis     Past Surgical History:  Procedure Laterality Date  . CATARACT EXTRACTION W/PHACO Left 05/12/2016   Procedure: CATARACT EXTRACTION PHACO AND INTRAOCULAR LENS PLACEMENT (IOC);  Surgeon: Tonny Branch, MD;  Location: [facility];  Service: Ophthalmology;  Laterality: Left;  CDE: 6.63  . CATARACT EXTRACTION W/PHACO Right 06/16/2016   Procedure: CATARACT EXTRACTION PHACO AND INTRAOCULAR LENS PLACEMENT (IOC); CDE:  4.13;  Surgeon: Tonny Branch, MD;  Location: [facility];  Service: Ophthalmology;  Laterality: Right;  . COLONOSCOPY  07/12/2012   Procedure: COLONOSCOPY;  Surgeon: Danie Binder, MD;  Location: [facility] SUITE;  Service: Endoscopy;  Laterality: N/A;  1:00  . lipoma-right shoulder    . spur and nerve repair right shoulder      Family History  Problem Relation Age of Onset  . Lupus Sister   . Colon cancer Neg Hx   . Liver disease Neg Hx   . Inflammatory bowel disease Neg Hx    Social History   Tobacco Use  . Smoking status: Former  Smoker    Packs/day: 0.50    Types: Cigarettes  . Smokeless tobacco: Never Used  . Tobacco comment: quit years ago  Substance Use Topics  . Alcohol use: No  . Drug use: No    Not on File  Current Meds  Medication Sig  . amLODipine (NORVASC) 10 MG tablet Take 10 mg by mouth daily.   . betamethasone dipropionate (DIPROLENE) 0.05 % cream Apply topically daily.  . Carboxymethylcellul-Glycerin 0.5-0.9 % SOLN Place 1 drop into both eyes daily as needed. Dry Eyes  . cholecalciferol (VITAMIN D) 1000 UNITS tablet Take 1,000 Units by mouth daily.  . furosemide (LASIX) 40 MG tablet Take 40 mg by mouth daily.   . hydroxychloroquine (PLAQUENIL) 200 MG tablet Take 200 mg by mouth 2 (two) times daily.  Marland Kitchen lisinopril (PRINIVIL,ZESTRIL) 40 MG tablet Take 40 mg by mouth daily.   . metFORMIN (GLUCOPHAGE) 500 MG tablet Take 500 mg by mouth 2 (two) times daily with a meal.   . omeprazole (PRILOSEC) 20 MG capsule Take 20 mg by mouth daily.   Marland Kitchen spironolactone (ALDACTONE) 25 MG tablet Take 25 mg by mouth 2 (two) times daily.     BP 129/73   Pulse 83   Ht 5\' 5"  (1.651 m)   Wt (!) 308  lb (139.7 kg)   BMI 51.25 kg/m   Physical Exam  Ortho Exam    MEDICAL DECISION SECTION  Xrays were done at: X-rays of the right ankle [facility] in 2014 she had a large plantar calcaneal spur as well as calcification at the insertion of the Achilles tendon ankle mortise was intact appeared to have a slightly flattened arch.  My independent reading of xrays:  X-rays were done today show the same Achilles spur and plantar spur  Encounter Diagnosis  Name Primary?  . Pain in joint involving right ankle and foot Yes   The patient meets the AMA guidelines for Morbid (severe) obesity with a BMI > 40.0 and I have recommended weight loss.  PLAN: (Rx., injectx, surgery, frx, mri/ct) Ankle exercises Meloxicam Return in 6 weeks  No orders of the defined types were placed in this encounter.   Arther Abbott, MD  12/22/2018 8:59 AM

## 2018-12-30 DIAGNOSIS — N182 Chronic kidney disease, stage 2 (mild): Secondary | ICD-10-CM | POA: Diagnosis not present

## 2018-12-30 DIAGNOSIS — M329 Systemic lupus erythematosus, unspecified: Secondary | ICD-10-CM | POA: Diagnosis not present

## 2018-12-30 DIAGNOSIS — E1122 Type 2 diabetes mellitus with diabetic chronic kidney disease: Secondary | ICD-10-CM | POA: Diagnosis not present

## 2018-12-30 DIAGNOSIS — K219 Gastro-esophageal reflux disease without esophagitis: Secondary | ICD-10-CM | POA: Diagnosis not present

## 2018-12-30 DIAGNOSIS — I10 Essential (primary) hypertension: Secondary | ICD-10-CM | POA: Diagnosis not present

## 2019-01-28 DIAGNOSIS — M329 Systemic lupus erythematosus, unspecified: Secondary | ICD-10-CM | POA: Diagnosis not present

## 2019-01-28 DIAGNOSIS — N182 Chronic kidney disease, stage 2 (mild): Secondary | ICD-10-CM | POA: Diagnosis not present

## 2019-01-28 DIAGNOSIS — E1122 Type 2 diabetes mellitus with diabetic chronic kidney disease: Secondary | ICD-10-CM | POA: Diagnosis not present

## 2019-01-28 DIAGNOSIS — I10 Essential (primary) hypertension: Secondary | ICD-10-CM | POA: Diagnosis not present

## 2019-01-28 DIAGNOSIS — K219 Gastro-esophageal reflux disease without esophagitis: Secondary | ICD-10-CM | POA: Diagnosis not present

## 2019-02-02 ENCOUNTER — Telehealth (INDEPENDENT_AMBULATORY_CARE_PROVIDER_SITE_OTHER): Payer: Medicare Other | Admitting: Orthopedic Surgery

## 2019-02-02 ENCOUNTER — Other Ambulatory Visit: Payer: Self-pay

## 2019-02-02 DIAGNOSIS — M7661 Achilles tendinitis, right leg: Secondary | ICD-10-CM | POA: Diagnosis not present

## 2019-02-02 NOTE — Progress Notes (Signed)
I connected with Allison Gould  on 02/02/19 at  1:40 PM EDT by telephone and verified that I am speaking with the correct person using two identifiers.   I discussed the limitations, risks, security and privacy concerns of performing an evaluation and management service by telephone and the availability of in person appointments. I also discussed with the patient that there may be a patient responsible charge related to this service. The patient expressed understanding and agreed to proceed.   Progress Note   Patient ID: Allison Gould, female   DOB: [birth year], 68 y.o.   MRN: [id number]   Chief complaint pain right foot  68 year old female had a 2-week history of pain in the right ankle we diagnosed her with Achilles tendinitis gave her some ankle exercises told to continue meloxicam which she was already on  This is a virtual office visit follow-up  She says that she is walking better.  She is having intermittent mild discomfort in the foot      ROS   Not on File   There were no vitals taken for this visit.  Physical Exam    Encounter Diagnosis  Name Primary?  . Achilles tendinitis of right lower extremity Yes    PLAN:   We recommended Epson salt soaks 20 to 30 minutes at a time, continue meloxicam and ankle exercises and call us if she deteriorates in any way    Arther Abbott, MD 02/02/2019 1:49 PM  Follow Up Instructions:    I discussed the assessment and treatment plan with the patient. The patient was provided an opportunity to ask questions and all were answered. The patient agreed with the plan and demonstrated an understanding of the instructions.   The patient was advised to call back or seek an in-person evaluation if the symptoms worsen or if the condition fails to improve as anticipated.  I provided 5 minutes of non-face-to-face time during this encounter.

## 2019-02-08 DIAGNOSIS — E1122 Type 2 diabetes mellitus with diabetic chronic kidney disease: Secondary | ICD-10-CM | POA: Diagnosis not present

## 2019-02-08 DIAGNOSIS — I10 Essential (primary) hypertension: Secondary | ICD-10-CM | POA: Diagnosis not present

## 2019-02-08 DIAGNOSIS — N182 Chronic kidney disease, stage 2 (mild): Secondary | ICD-10-CM | POA: Diagnosis not present

## 2019-02-08 DIAGNOSIS — K219 Gastro-esophageal reflux disease without esophagitis: Secondary | ICD-10-CM | POA: Diagnosis not present

## 2019-02-08 DIAGNOSIS — M329 Systemic lupus erythematosus, unspecified: Secondary | ICD-10-CM | POA: Diagnosis not present

## 2019-03-07 DIAGNOSIS — Z0000 Encounter for general adult medical examination without abnormal findings: Secondary | ICD-10-CM | POA: Diagnosis not present

## 2019-03-07 DIAGNOSIS — E1122 Type 2 diabetes mellitus with diabetic chronic kidney disease: Secondary | ICD-10-CM | POA: Diagnosis not present

## 2019-03-07 DIAGNOSIS — N182 Chronic kidney disease, stage 2 (mild): Secondary | ICD-10-CM | POA: Diagnosis not present

## 2019-03-07 DIAGNOSIS — D509 Iron deficiency anemia, unspecified: Secondary | ICD-10-CM | POA: Diagnosis not present

## 2019-03-07 DIAGNOSIS — I10 Essential (primary) hypertension: Secondary | ICD-10-CM | POA: Diagnosis not present

## 2019-03-07 DIAGNOSIS — D631 Anemia in chronic kidney disease: Secondary | ICD-10-CM | POA: Diagnosis not present

## 2019-03-10 DIAGNOSIS — I10 Essential (primary) hypertension: Secondary | ICD-10-CM | POA: Diagnosis not present

## 2019-03-10 DIAGNOSIS — E1122 Type 2 diabetes mellitus with diabetic chronic kidney disease: Secondary | ICD-10-CM | POA: Diagnosis not present

## 2019-03-10 DIAGNOSIS — Z0000 Encounter for general adult medical examination without abnormal findings: Secondary | ICD-10-CM | POA: Diagnosis not present

## 2019-03-10 DIAGNOSIS — M329 Systemic lupus erythematosus, unspecified: Secondary | ICD-10-CM | POA: Diagnosis not present

## 2019-03-10 DIAGNOSIS — K219 Gastro-esophageal reflux disease without esophagitis: Secondary | ICD-10-CM | POA: Diagnosis not present

## 2019-03-16 DIAGNOSIS — E1122 Type 2 diabetes mellitus with diabetic chronic kidney disease: Secondary | ICD-10-CM | POA: Diagnosis not present

## 2019-03-16 DIAGNOSIS — K219 Gastro-esophageal reflux disease without esophagitis: Secondary | ICD-10-CM | POA: Diagnosis not present

## 2019-03-16 DIAGNOSIS — N182 Chronic kidney disease, stage 2 (mild): Secondary | ICD-10-CM | POA: Diagnosis not present

## 2019-03-16 DIAGNOSIS — I10 Essential (primary) hypertension: Secondary | ICD-10-CM | POA: Diagnosis not present

## 2019-03-25 DIAGNOSIS — Z23 Encounter for immunization: Secondary | ICD-10-CM | POA: Diagnosis not present

## 2019-03-25 DIAGNOSIS — R600 Localized edema: Secondary | ICD-10-CM | POA: Diagnosis not present

## 2019-03-25 DIAGNOSIS — N183 Chronic kidney disease, stage 3 (moderate): Secondary | ICD-10-CM | POA: Diagnosis not present

## 2019-03-25 DIAGNOSIS — I10 Essential (primary) hypertension: Secondary | ICD-10-CM | POA: Diagnosis not present

## 2019-03-25 DIAGNOSIS — E875 Hyperkalemia: Secondary | ICD-10-CM | POA: Diagnosis not present

## 2019-03-25 DIAGNOSIS — K219 Gastro-esophageal reflux disease without esophagitis: Secondary | ICD-10-CM | POA: Diagnosis not present

## 2019-03-25 DIAGNOSIS — E782 Mixed hyperlipidemia: Secondary | ICD-10-CM | POA: Diagnosis not present

## 2019-03-25 DIAGNOSIS — N189 Chronic kidney disease, unspecified: Secondary | ICD-10-CM | POA: Diagnosis not present

## 2019-04-29 DIAGNOSIS — I10 Essential (primary) hypertension: Secondary | ICD-10-CM | POA: Diagnosis not present

## 2019-04-29 DIAGNOSIS — N182 Chronic kidney disease, stage 2 (mild): Secondary | ICD-10-CM | POA: Diagnosis not present

## 2019-04-29 DIAGNOSIS — E1122 Type 2 diabetes mellitus with diabetic chronic kidney disease: Secondary | ICD-10-CM | POA: Diagnosis not present

## 2019-04-29 DIAGNOSIS — K219 Gastro-esophageal reflux disease without esophagitis: Secondary | ICD-10-CM | POA: Diagnosis not present

## 2019-05-09 DIAGNOSIS — K219 Gastro-esophageal reflux disease without esophagitis: Secondary | ICD-10-CM | POA: Diagnosis not present

## 2019-05-09 DIAGNOSIS — N182 Chronic kidney disease, stage 2 (mild): Secondary | ICD-10-CM | POA: Diagnosis not present

## 2019-05-09 DIAGNOSIS — I10 Essential (primary) hypertension: Secondary | ICD-10-CM | POA: Diagnosis not present

## 2019-05-09 DIAGNOSIS — E1122 Type 2 diabetes mellitus with diabetic chronic kidney disease: Secondary | ICD-10-CM | POA: Diagnosis not present

## 2019-05-09 DIAGNOSIS — M329 Systemic lupus erythematosus, unspecified: Secondary | ICD-10-CM | POA: Diagnosis not present

## 2019-05-24 ENCOUNTER — Other Ambulatory Visit (HOSPITAL_COMMUNITY): Payer: Self-pay | Admitting: Internal Medicine

## 2019-05-24 DIAGNOSIS — Z1231 Encounter for screening mammogram for malignant neoplasm of breast: Secondary | ICD-10-CM

## 2019-06-01 ENCOUNTER — Ambulatory Visit (HOSPITAL_COMMUNITY): Payer: Medicare Other

## 2019-06-15 ENCOUNTER — Other Ambulatory Visit: Payer: Self-pay

## 2019-06-15 ENCOUNTER — Ambulatory Visit (HOSPITAL_COMMUNITY)
Admission: RE | Admit: 2019-06-15 | Discharge: 2019-06-15 | Disposition: A | Discharge status: Home / Self Care | Payer: Medicare Other | Source: Ambulatory Visit | Attending: Internal Medicine | Admitting: Internal Medicine

## 2019-06-15 ENCOUNTER — Encounter (HOSPITAL_COMMUNITY): Payer: Self-pay

## 2019-06-15 DIAGNOSIS — Z1231 Encounter for screening mammogram for malignant neoplasm of breast: Secondary | ICD-10-CM | POA: Diagnosis not present

## 2019-06-27 DIAGNOSIS — K219 Gastro-esophageal reflux disease without esophagitis: Secondary | ICD-10-CM | POA: Diagnosis not present

## 2019-06-27 DIAGNOSIS — I10 Essential (primary) hypertension: Secondary | ICD-10-CM | POA: Diagnosis not present

## 2019-06-27 DIAGNOSIS — M329 Systemic lupus erythematosus, unspecified: Secondary | ICD-10-CM | POA: Diagnosis not present

## 2019-06-27 DIAGNOSIS — N182 Chronic kidney disease, stage 2 (mild): Secondary | ICD-10-CM | POA: Diagnosis not present

## 2019-06-27 DIAGNOSIS — E1122 Type 2 diabetes mellitus with diabetic chronic kidney disease: Secondary | ICD-10-CM | POA: Diagnosis not present

## 2019-07-22 ENCOUNTER — Other Ambulatory Visit: Payer: Self-pay

## 2019-07-22 NOTE — Patient Outreach (Signed)
[facility]) Care Management  07/22/2019  Allison Gould 05-01-1951 [id number]   Medication Adherence call to [name] Compliant Voice message left with a call back number. [name] is showing past due on Pravastatin 10 mg under [place].   [place] Management Direct Dial [phone]  Fax [phone] [email or web address]

## 2019-08-05 ENCOUNTER — Other Ambulatory Visit: Payer: Self-pay

## 2019-08-05 NOTE — Patient Outreach (Signed)
[facility]) Care Management  08/05/2019  Allison Gould 09-Jun-1951 [id number]   Medication Adherence call to [name] Compliant Voice message left with a call back number. [name] is showing past due on Pravastatin 10 mg and Metformin 500 mg under [name].   [name] Management Direct Dial [phone]  Fax [phone] [name]

## 2019-08-22 DIAGNOSIS — I10 Essential (primary) hypertension: Secondary | ICD-10-CM | POA: Diagnosis not present

## 2019-08-22 DIAGNOSIS — M79605 Pain in left leg: Secondary | ICD-10-CM | POA: Diagnosis not present

## 2019-08-22 DIAGNOSIS — M329 Systemic lupus erythematosus, unspecified: Secondary | ICD-10-CM | POA: Diagnosis not present

## 2019-08-25 DIAGNOSIS — M329 Systemic lupus erythematosus, unspecified: Secondary | ICD-10-CM | POA: Diagnosis not present

## 2019-08-25 DIAGNOSIS — R600 Localized edema: Secondary | ICD-10-CM | POA: Diagnosis not present

## 2019-08-25 DIAGNOSIS — I10 Essential (primary) hypertension: Secondary | ICD-10-CM | POA: Diagnosis not present

## 2019-09-05 DIAGNOSIS — N189 Chronic kidney disease, unspecified: Secondary | ICD-10-CM | POA: Diagnosis not present

## 2019-09-05 DIAGNOSIS — E1122 Type 2 diabetes mellitus with diabetic chronic kidney disease: Secondary | ICD-10-CM | POA: Diagnosis not present

## 2019-09-05 DIAGNOSIS — Z23 Encounter for immunization: Secondary | ICD-10-CM | POA: Diagnosis not present

## 2019-09-05 DIAGNOSIS — E782 Mixed hyperlipidemia: Secondary | ICD-10-CM | POA: Diagnosis not present

## 2019-09-05 DIAGNOSIS — E875 Hyperkalemia: Secondary | ICD-10-CM | POA: Diagnosis not present

## 2019-09-05 DIAGNOSIS — N183 Chronic kidney disease, stage 3 unspecified: Secondary | ICD-10-CM | POA: Diagnosis not present

## 2019-09-08 DIAGNOSIS — G5792 Unspecified mononeuropathy of left lower limb: Secondary | ICD-10-CM | POA: Diagnosis not present

## 2019-09-08 DIAGNOSIS — Z0000 Encounter for general adult medical examination without abnormal findings: Secondary | ICD-10-CM | POA: Diagnosis not present

## 2019-09-08 DIAGNOSIS — I10 Essential (primary) hypertension: Secondary | ICD-10-CM | POA: Diagnosis not present

## 2019-09-08 DIAGNOSIS — K219 Gastro-esophageal reflux disease without esophagitis: Secondary | ICD-10-CM | POA: Diagnosis not present

## 2019-09-08 DIAGNOSIS — M545 Low back pain: Secondary | ICD-10-CM | POA: Diagnosis not present

## 2019-09-27 IMAGING — MG DIGITAL SCREENING BILATERAL MAMMOGRAM WITH TOMO AND CAD
6 of 12 series · 6 of 36 positions shown · non-contrast
Comparison: Previous exam(s).

CLINICAL DATA: Screening.

EXAM:
DIGITAL SCREENING BILATERAL MAMMOGRAM WITH TOMO AND CAD

[L CC synth-2D (1 of 2)]
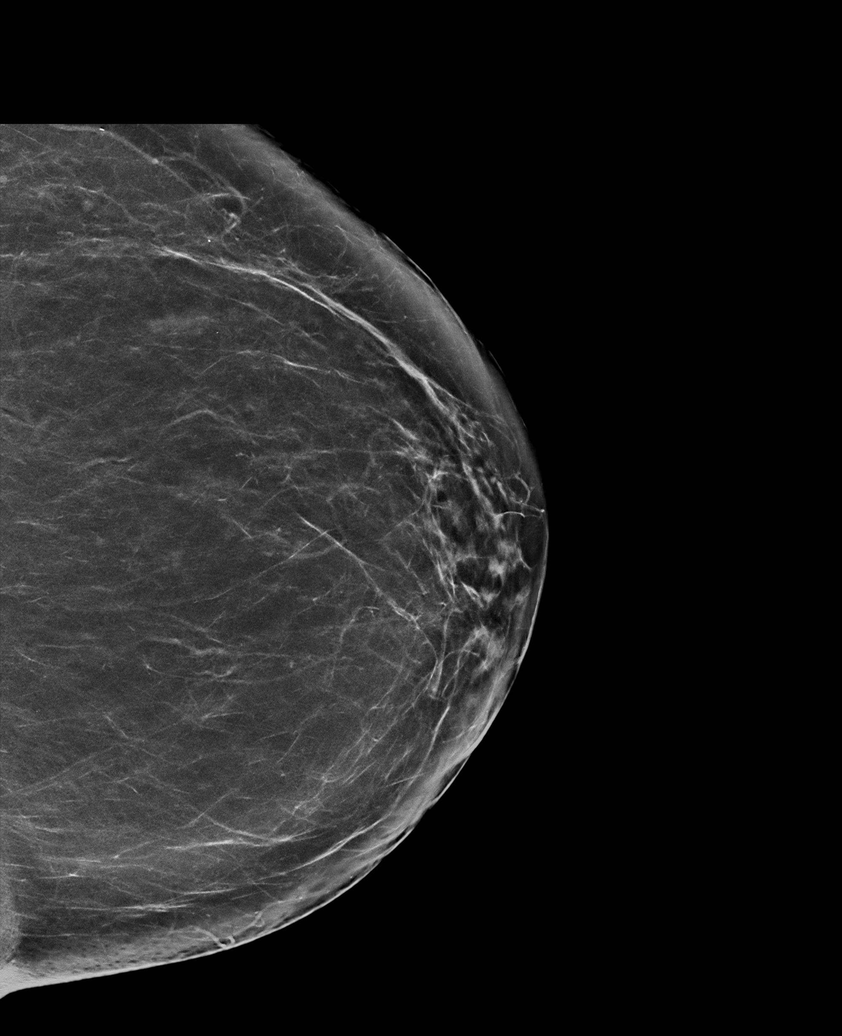

[R MLO synth-2D (1 of 2)]
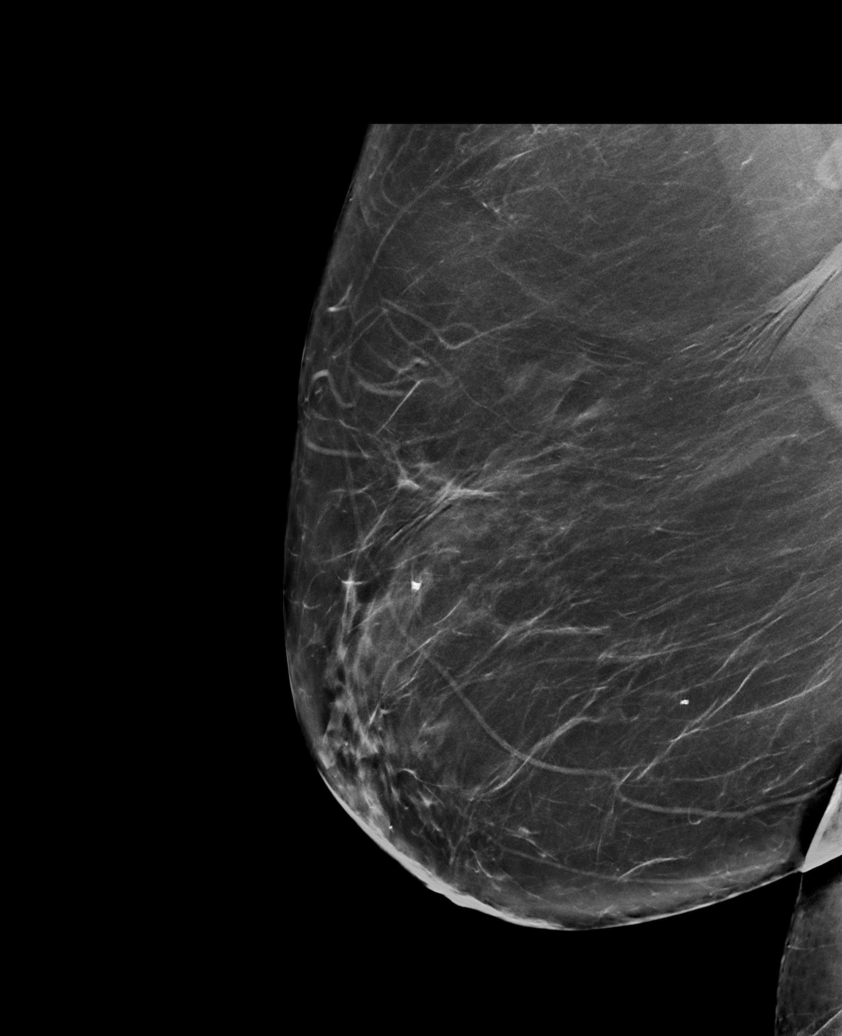

[R MLO synth-2D (2 of 2)]
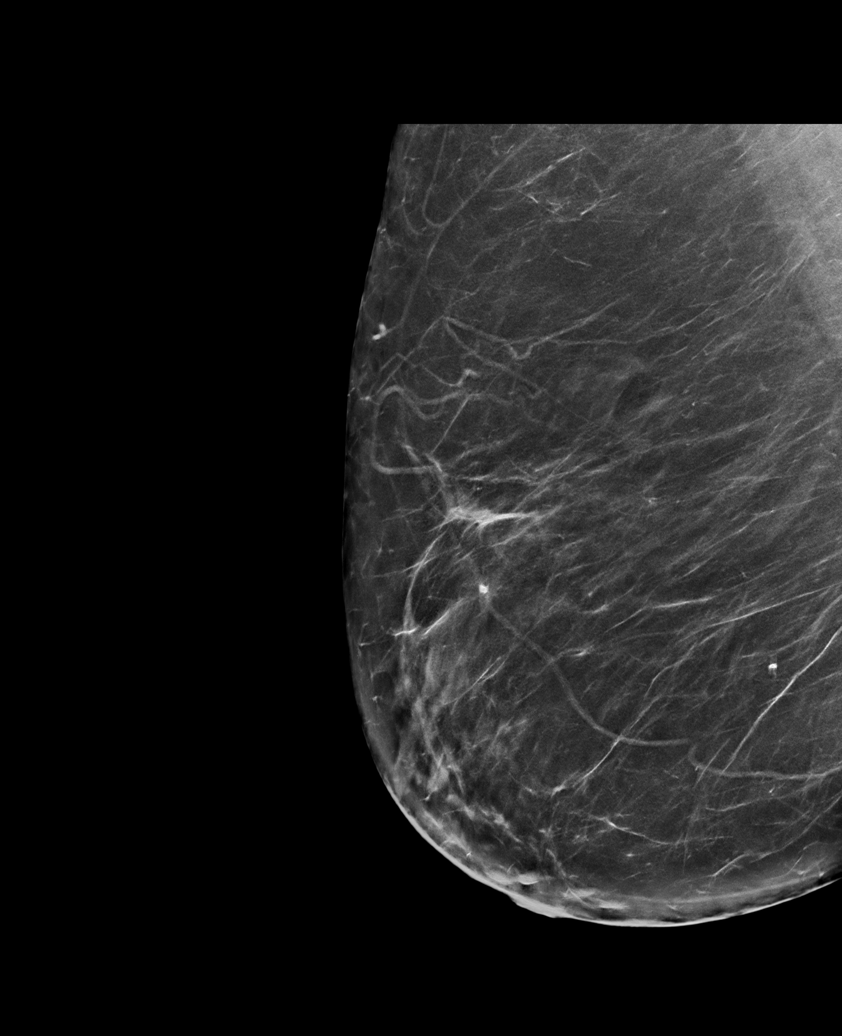

[L CC synth-2D (2 of 2)]
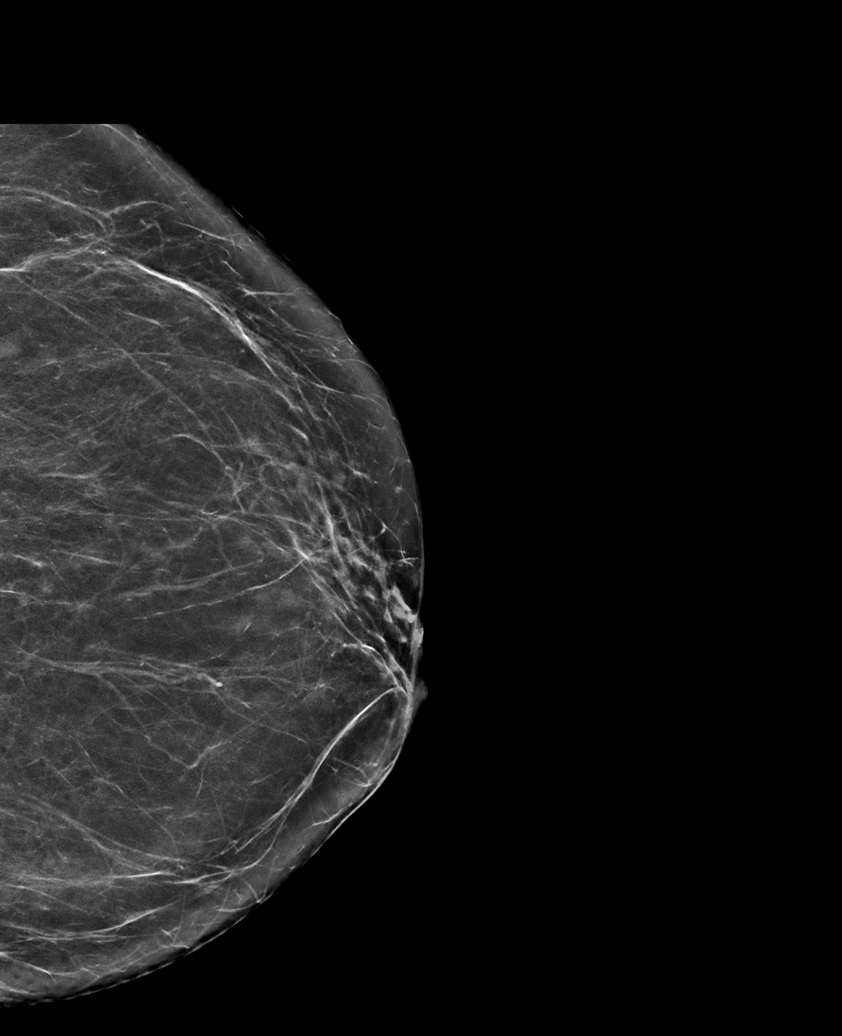

[L MLO synth-2D]
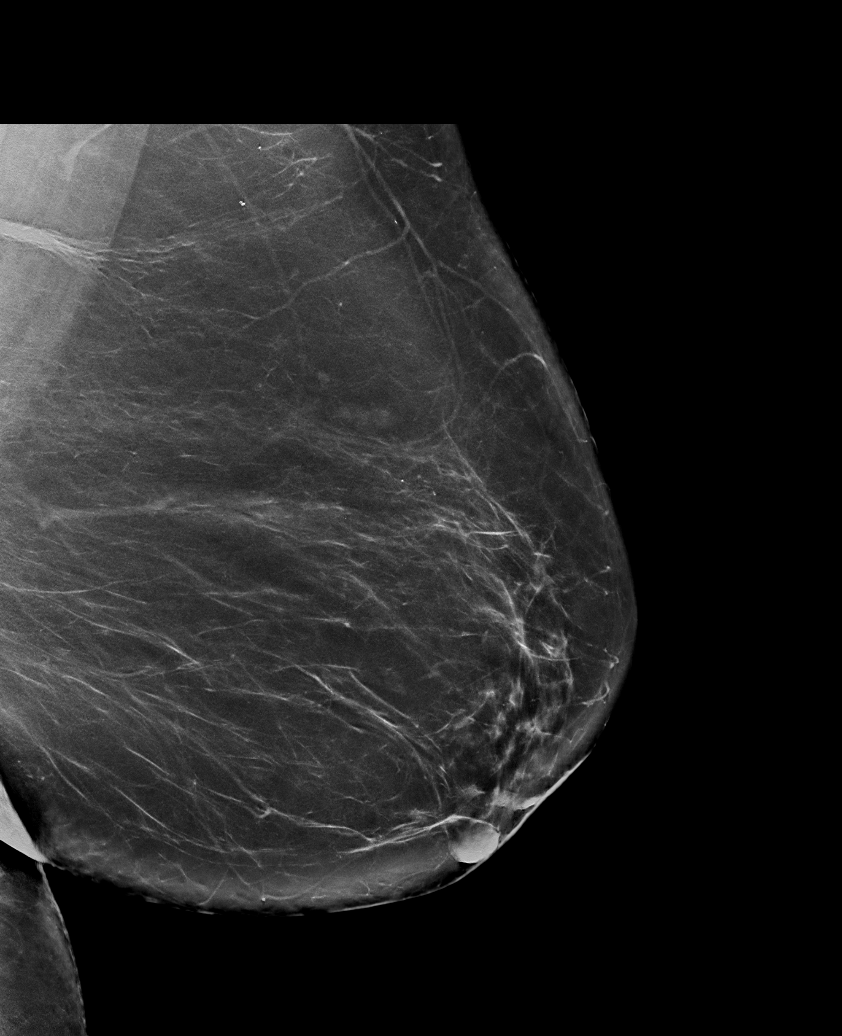

[R CC synth-2D]
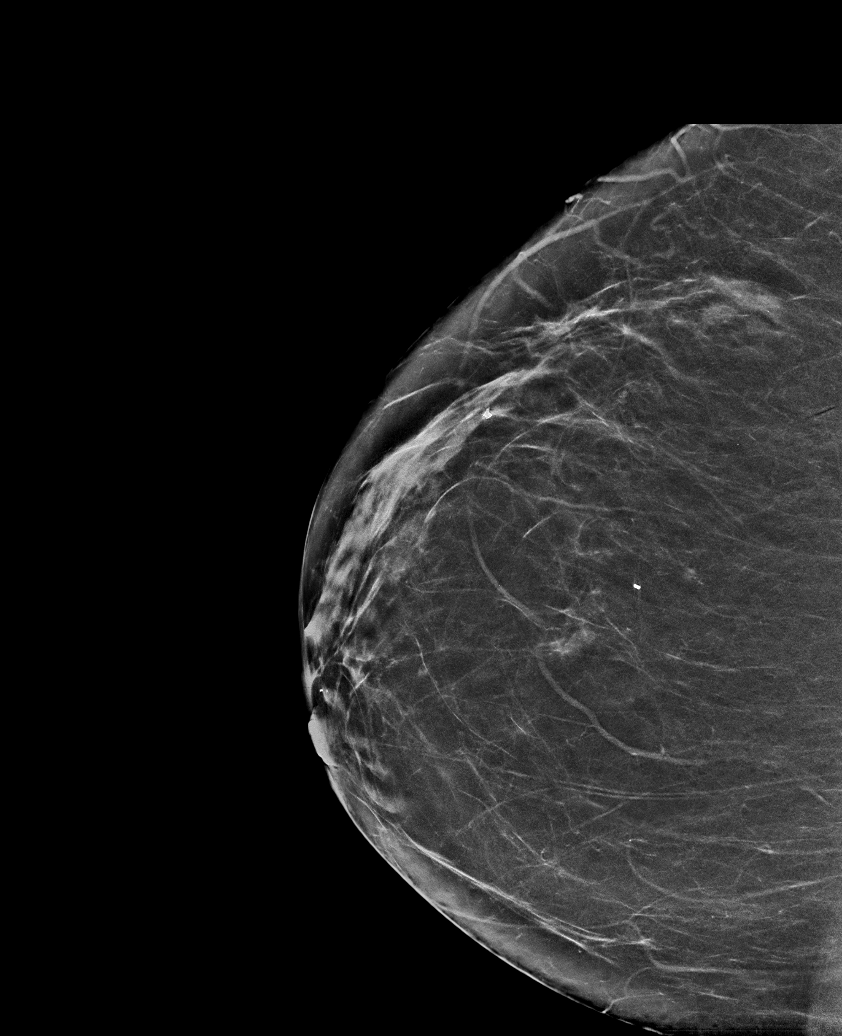

[6 of 36 positions shown; findings below may reference images not displayed]

ACR Breast Density Category b: There are scattered areas of
fibroglandular density.
FINDINGS: There are no findings suspicious for malignancy. Images were
processed with CAD.
IMPRESSION: No mammographic evidence of malignancy. A result letter of this
screening mammogram will be mailed directly to the patient.

RECOMMENDATION:
Screening mammogram in one year. (Code:[id number])

BI-RADS CATEGORY  1: Negative.

## 2019-10-04 DIAGNOSIS — I10 Essential (primary) hypertension: Secondary | ICD-10-CM | POA: Diagnosis not present

## 2019-10-04 DIAGNOSIS — K219 Gastro-esophageal reflux disease without esophagitis: Secondary | ICD-10-CM | POA: Diagnosis not present

## 2019-10-05 DIAGNOSIS — L816 Other disorders of diminished melanin formation: Secondary | ICD-10-CM | POA: Diagnosis not present

## 2019-10-05 DIAGNOSIS — L930 Discoid lupus erythematosus: Secondary | ICD-10-CM | POA: Diagnosis not present

## 2019-10-05 DIAGNOSIS — L669 Cicatricial alopecia, unspecified: Secondary | ICD-10-CM | POA: Diagnosis not present

## 2019-10-05 DIAGNOSIS — R6889 Other general symptoms and signs: Secondary | ICD-10-CM | POA: Diagnosis not present

## 2019-12-07 ENCOUNTER — Other Ambulatory Visit: Payer: Self-pay

## 2019-12-07 ENCOUNTER — Ambulatory Visit: Payer: Medicare Other | Attending: Internal Medicine

## 2019-12-07 ENCOUNTER — Ambulatory Visit: Payer: Self-pay

## 2019-12-07 DIAGNOSIS — Z20822 Contact with and (suspected) exposure to covid-19: Secondary | ICD-10-CM

## 2019-12-08 LAB — NOVEL CORONAVIRUS, NAA: SARS-CoV-2, NAA: NOT DETECTED

## 2019-12-16 DIAGNOSIS — E1122 Type 2 diabetes mellitus with diabetic chronic kidney disease: Secondary | ICD-10-CM | POA: Diagnosis not present

## 2019-12-16 DIAGNOSIS — E782 Mixed hyperlipidemia: Secondary | ICD-10-CM | POA: Diagnosis not present

## 2019-12-16 DIAGNOSIS — E875 Hyperkalemia: Secondary | ICD-10-CM | POA: Diagnosis not present

## 2019-12-16 DIAGNOSIS — N183 Chronic kidney disease, stage 3 unspecified: Secondary | ICD-10-CM | POA: Diagnosis not present

## 2019-12-16 DIAGNOSIS — N189 Chronic kidney disease, unspecified: Secondary | ICD-10-CM | POA: Diagnosis not present

## 2019-12-16 DIAGNOSIS — Z23 Encounter for immunization: Secondary | ICD-10-CM | POA: Diagnosis not present

## 2019-12-21 DIAGNOSIS — M329 Systemic lupus erythematosus, unspecified: Secondary | ICD-10-CM | POA: Diagnosis not present

## 2019-12-21 DIAGNOSIS — G5792 Unspecified mononeuropathy of left lower limb: Secondary | ICD-10-CM | POA: Diagnosis not present

## 2019-12-21 DIAGNOSIS — E1122 Type 2 diabetes mellitus with diabetic chronic kidney disease: Secondary | ICD-10-CM | POA: Diagnosis not present

## 2019-12-21 DIAGNOSIS — I10 Essential (primary) hypertension: Secondary | ICD-10-CM | POA: Diagnosis not present

## 2019-12-21 DIAGNOSIS — E782 Mixed hyperlipidemia: Secondary | ICD-10-CM | POA: Diagnosis not present

## 2020-01-10 DIAGNOSIS — N183 Chronic kidney disease, stage 3 unspecified: Secondary | ICD-10-CM | POA: Diagnosis not present

## 2020-01-10 DIAGNOSIS — I129 Hypertensive chronic kidney disease with stage 1 through stage 4 chronic kidney disease, or unspecified chronic kidney disease: Secondary | ICD-10-CM | POA: Diagnosis not present

## 2020-01-10 DIAGNOSIS — E782 Mixed hyperlipidemia: Secondary | ICD-10-CM | POA: Diagnosis not present

## 2020-01-10 DIAGNOSIS — I10 Essential (primary) hypertension: Secondary | ICD-10-CM | POA: Diagnosis not present

## 2020-01-10 DIAGNOSIS — E1122 Type 2 diabetes mellitus with diabetic chronic kidney disease: Secondary | ICD-10-CM | POA: Diagnosis not present

## 2020-01-11 DIAGNOSIS — K219 Gastro-esophageal reflux disease without esophagitis: Secondary | ICD-10-CM | POA: Diagnosis not present

## 2020-01-11 DIAGNOSIS — J4530 Mild persistent asthma, uncomplicated: Secondary | ICD-10-CM | POA: Diagnosis not present

## 2020-01-11 DIAGNOSIS — M545 Low back pain: Secondary | ICD-10-CM | POA: Diagnosis not present

## 2020-01-11 DIAGNOSIS — I10 Essential (primary) hypertension: Secondary | ICD-10-CM | POA: Diagnosis not present

## 2020-01-11 DIAGNOSIS — G5792 Unspecified mononeuropathy of left lower limb: Secondary | ICD-10-CM | POA: Diagnosis not present

## 2020-02-17 DIAGNOSIS — K219 Gastro-esophageal reflux disease without esophagitis: Secondary | ICD-10-CM | POA: Diagnosis not present

## 2020-02-17 DIAGNOSIS — I10 Essential (primary) hypertension: Secondary | ICD-10-CM | POA: Diagnosis not present

## 2020-02-17 DIAGNOSIS — J4530 Mild persistent asthma, uncomplicated: Secondary | ICD-10-CM | POA: Diagnosis not present

## 2020-02-17 DIAGNOSIS — M545 Low back pain: Secondary | ICD-10-CM | POA: Diagnosis not present

## 2020-03-21 DIAGNOSIS — D509 Iron deficiency anemia, unspecified: Secondary | ICD-10-CM | POA: Diagnosis not present

## 2020-03-21 DIAGNOSIS — D631 Anemia in chronic kidney disease: Secondary | ICD-10-CM | POA: Diagnosis not present

## 2020-03-21 DIAGNOSIS — D649 Anemia, unspecified: Secondary | ICD-10-CM | POA: Diagnosis not present

## 2020-03-21 DIAGNOSIS — E0829 Diabetes mellitus due to underlying condition with other diabetic kidney complication: Secondary | ICD-10-CM | POA: Diagnosis not present

## 2020-03-21 DIAGNOSIS — E1122 Type 2 diabetes mellitus with diabetic chronic kidney disease: Secondary | ICD-10-CM | POA: Diagnosis not present

## 2020-03-26 DIAGNOSIS — E1122 Type 2 diabetes mellitus with diabetic chronic kidney disease: Secondary | ICD-10-CM | POA: Diagnosis not present

## 2020-03-26 DIAGNOSIS — E782 Mixed hyperlipidemia: Secondary | ICD-10-CM | POA: Diagnosis not present

## 2020-03-26 DIAGNOSIS — Z0001 Encounter for general adult medical examination with abnormal findings: Secondary | ICD-10-CM | POA: Diagnosis not present

## 2020-03-26 DIAGNOSIS — I10 Essential (primary) hypertension: Secondary | ICD-10-CM | POA: Diagnosis not present

## 2020-03-26 DIAGNOSIS — M329 Systemic lupus erythematosus, unspecified: Secondary | ICD-10-CM | POA: Diagnosis not present

## 2020-05-04 DIAGNOSIS — N1832 Chronic kidney disease, stage 3b: Secondary | ICD-10-CM | POA: Diagnosis not present

## 2020-05-04 DIAGNOSIS — K219 Gastro-esophageal reflux disease without esophagitis: Secondary | ICD-10-CM | POA: Diagnosis not present

## 2020-05-04 DIAGNOSIS — I129 Hypertensive chronic kidney disease with stage 1 through stage 4 chronic kidney disease, or unspecified chronic kidney disease: Secondary | ICD-10-CM | POA: Diagnosis not present

## 2020-05-04 DIAGNOSIS — E7849 Other hyperlipidemia: Secondary | ICD-10-CM | POA: Diagnosis not present

## 2020-05-14 DIAGNOSIS — N183 Chronic kidney disease, stage 3 unspecified: Secondary | ICD-10-CM | POA: Diagnosis not present

## 2020-05-14 DIAGNOSIS — Z0001 Encounter for general adult medical examination with abnormal findings: Secondary | ICD-10-CM | POA: Diagnosis not present

## 2020-05-14 DIAGNOSIS — I129 Hypertensive chronic kidney disease with stage 1 through stage 4 chronic kidney disease, or unspecified chronic kidney disease: Secondary | ICD-10-CM | POA: Diagnosis not present

## 2020-05-14 DIAGNOSIS — J4530 Mild persistent asthma, uncomplicated: Secondary | ICD-10-CM | POA: Diagnosis not present

## 2020-05-14 DIAGNOSIS — Z23 Encounter for immunization: Secondary | ICD-10-CM | POA: Diagnosis not present

## 2020-06-04 DIAGNOSIS — E875 Hyperkalemia: Secondary | ICD-10-CM | POA: Diagnosis not present

## 2020-06-04 DIAGNOSIS — N1832 Chronic kidney disease, stage 3b: Secondary | ICD-10-CM | POA: Diagnosis not present

## 2020-06-04 DIAGNOSIS — E1122 Type 2 diabetes mellitus with diabetic chronic kidney disease: Secondary | ICD-10-CM | POA: Diagnosis not present

## 2020-06-04 DIAGNOSIS — I129 Hypertensive chronic kidney disease with stage 1 through stage 4 chronic kidney disease, or unspecified chronic kidney disease: Secondary | ICD-10-CM | POA: Diagnosis not present

## 2020-06-04 DIAGNOSIS — E7849 Other hyperlipidemia: Secondary | ICD-10-CM | POA: Diagnosis not present

## 2020-08-01 DIAGNOSIS — I129 Hypertensive chronic kidney disease with stage 1 through stage 4 chronic kidney disease, or unspecified chronic kidney disease: Secondary | ICD-10-CM | POA: Diagnosis not present

## 2020-08-01 DIAGNOSIS — E1122 Type 2 diabetes mellitus with diabetic chronic kidney disease: Secondary | ICD-10-CM | POA: Diagnosis not present

## 2020-08-01 DIAGNOSIS — Z23 Encounter for immunization: Secondary | ICD-10-CM | POA: Diagnosis not present

## 2020-08-01 DIAGNOSIS — E7849 Other hyperlipidemia: Secondary | ICD-10-CM | POA: Diagnosis not present

## 2020-08-01 DIAGNOSIS — Z0001 Encounter for general adult medical examination with abnormal findings: Secondary | ICD-10-CM | POA: Diagnosis not present

## 2020-08-01 DIAGNOSIS — J4530 Mild persistent asthma, uncomplicated: Secondary | ICD-10-CM | POA: Diagnosis not present

## 2020-08-03 DIAGNOSIS — E1122 Type 2 diabetes mellitus with diabetic chronic kidney disease: Secondary | ICD-10-CM | POA: Diagnosis not present

## 2020-08-03 DIAGNOSIS — I129 Hypertensive chronic kidney disease with stage 1 through stage 4 chronic kidney disease, or unspecified chronic kidney disease: Secondary | ICD-10-CM | POA: Diagnosis not present

## 2020-08-03 DIAGNOSIS — E782 Mixed hyperlipidemia: Secondary | ICD-10-CM | POA: Diagnosis not present

## 2020-08-03 DIAGNOSIS — I10 Essential (primary) hypertension: Secondary | ICD-10-CM | POA: Diagnosis not present

## 2020-08-03 DIAGNOSIS — N183 Chronic kidney disease, stage 3 unspecified: Secondary | ICD-10-CM | POA: Diagnosis not present

## 2020-08-06 DIAGNOSIS — M329 Systemic lupus erythematosus, unspecified: Secondary | ICD-10-CM | POA: Diagnosis not present

## 2020-08-06 DIAGNOSIS — E1122 Type 2 diabetes mellitus with diabetic chronic kidney disease: Secondary | ICD-10-CM | POA: Diagnosis not present

## 2020-08-06 DIAGNOSIS — G5792 Unspecified mononeuropathy of left lower limb: Secondary | ICD-10-CM | POA: Diagnosis not present

## 2020-08-06 DIAGNOSIS — E782 Mixed hyperlipidemia: Secondary | ICD-10-CM | POA: Diagnosis not present

## 2020-08-06 DIAGNOSIS — Z23 Encounter for immunization: Secondary | ICD-10-CM | POA: Diagnosis not present

## 2020-08-06 DIAGNOSIS — I10 Essential (primary) hypertension: Secondary | ICD-10-CM | POA: Diagnosis not present

## 2020-09-03 DIAGNOSIS — N1832 Chronic kidney disease, stage 3b: Secondary | ICD-10-CM | POA: Diagnosis not present

## 2020-09-03 DIAGNOSIS — D631 Anemia in chronic kidney disease: Secondary | ICD-10-CM | POA: Diagnosis not present

## 2020-09-03 DIAGNOSIS — Z23 Encounter for immunization: Secondary | ICD-10-CM | POA: Diagnosis not present

## 2020-09-03 DIAGNOSIS — I129 Hypertensive chronic kidney disease with stage 1 through stage 4 chronic kidney disease, or unspecified chronic kidney disease: Secondary | ICD-10-CM | POA: Diagnosis not present

## 2020-09-03 DIAGNOSIS — E1122 Type 2 diabetes mellitus with diabetic chronic kidney disease: Secondary | ICD-10-CM | POA: Diagnosis not present

## 2020-10-03 ENCOUNTER — Other Ambulatory Visit: Payer: Self-pay

## 2020-10-03 ENCOUNTER — Other Ambulatory Visit (HOSPITAL_COMMUNITY): Payer: Self-pay | Admitting: Internal Medicine

## 2020-10-03 ENCOUNTER — Ambulatory Visit (HOSPITAL_COMMUNITY)
Admission: RE | Admit: 2020-10-03 | Discharge: 2020-10-03 | Disposition: A | Discharge status: Home / Self Care | Payer: Medicare Other | Source: Ambulatory Visit | Attending: Internal Medicine | Admitting: Internal Medicine

## 2020-10-03 DIAGNOSIS — M25511 Pain in right shoulder: Secondary | ICD-10-CM | POA: Insufficient documentation

## 2020-10-03 DIAGNOSIS — M542 Cervicalgia: Secondary | ICD-10-CM

## 2020-10-03 DIAGNOSIS — E079 Disorder of thyroid, unspecified: Secondary | ICD-10-CM | POA: Diagnosis not present

## 2020-10-08 ENCOUNTER — Other Ambulatory Visit: Payer: Self-pay | Admitting: Internal Medicine

## 2020-10-08 ENCOUNTER — Other Ambulatory Visit (HOSPITAL_COMMUNITY): Payer: Self-pay | Admitting: Internal Medicine

## 2020-10-08 DIAGNOSIS — E079 Disorder of thyroid, unspecified: Secondary | ICD-10-CM

## 2020-10-16 ENCOUNTER — Ambulatory Visit (HOSPITAL_COMMUNITY)
Admission: RE | Admit: 2020-10-16 | Discharge: 2020-10-16 | Disposition: A | Discharge status: Home / Self Care | Payer: Medicare Other | Source: Ambulatory Visit | Attending: Internal Medicine | Admitting: Internal Medicine

## 2020-10-16 ENCOUNTER — Other Ambulatory Visit: Payer: Self-pay

## 2020-10-16 DIAGNOSIS — E079 Disorder of thyroid, unspecified: Secondary | ICD-10-CM | POA: Diagnosis not present

## 2020-10-16 DIAGNOSIS — E042 Nontoxic multinodular goiter: Secondary | ICD-10-CM | POA: Diagnosis not present

## 2020-10-23 DIAGNOSIS — M25511 Pain in right shoulder: Secondary | ICD-10-CM | POA: Diagnosis not present

## 2020-11-02 DIAGNOSIS — Z23 Encounter for immunization: Secondary | ICD-10-CM | POA: Diagnosis not present

## 2020-11-02 DIAGNOSIS — N1832 Chronic kidney disease, stage 3b: Secondary | ICD-10-CM | POA: Diagnosis not present

## 2020-11-02 DIAGNOSIS — E1122 Type 2 diabetes mellitus with diabetic chronic kidney disease: Secondary | ICD-10-CM | POA: Diagnosis not present

## 2020-11-12 DIAGNOSIS — I129 Hypertensive chronic kidney disease with stage 1 through stage 4 chronic kidney disease, or unspecified chronic kidney disease: Secondary | ICD-10-CM | POA: Diagnosis not present

## 2020-11-12 DIAGNOSIS — E1122 Type 2 diabetes mellitus with diabetic chronic kidney disease: Secondary | ICD-10-CM | POA: Diagnosis not present

## 2020-11-12 DIAGNOSIS — E7849 Other hyperlipidemia: Secondary | ICD-10-CM | POA: Diagnosis not present

## 2020-11-12 DIAGNOSIS — Z23 Encounter for immunization: Secondary | ICD-10-CM | POA: Diagnosis not present

## 2020-11-12 DIAGNOSIS — J4530 Mild persistent asthma, uncomplicated: Secondary | ICD-10-CM | POA: Diagnosis not present

## 2020-11-12 DIAGNOSIS — Z0001 Encounter for general adult medical examination with abnormal findings: Secondary | ICD-10-CM | POA: Diagnosis not present

## 2020-11-19 DIAGNOSIS — N1832 Chronic kidney disease, stage 3b: Secondary | ICD-10-CM | POA: Diagnosis not present

## 2020-11-19 DIAGNOSIS — R609 Edema, unspecified: Secondary | ICD-10-CM | POA: Diagnosis not present

## 2020-11-19 DIAGNOSIS — G5603 Carpal tunnel syndrome, bilateral upper limbs: Secondary | ICD-10-CM | POA: Diagnosis not present

## 2020-11-19 DIAGNOSIS — M329 Systemic lupus erythematosus, unspecified: Secondary | ICD-10-CM | POA: Diagnosis not present

## 2020-11-19 DIAGNOSIS — E1122 Type 2 diabetes mellitus with diabetic chronic kidney disease: Secondary | ICD-10-CM | POA: Diagnosis not present

## 2020-11-19 DIAGNOSIS — E782 Mixed hyperlipidemia: Secondary | ICD-10-CM | POA: Diagnosis not present

## 2020-11-19 DIAGNOSIS — I10 Essential (primary) hypertension: Secondary | ICD-10-CM | POA: Diagnosis not present

## 2020-11-19 DIAGNOSIS — D631 Anemia in chronic kidney disease: Secondary | ICD-10-CM | POA: Diagnosis not present

## 2020-11-19 DIAGNOSIS — E875 Hyperkalemia: Secondary | ICD-10-CM | POA: Diagnosis not present

## 2020-11-19 DIAGNOSIS — G5792 Unspecified mononeuropathy of left lower limb: Secondary | ICD-10-CM | POA: Diagnosis not present

## 2020-11-19 DIAGNOSIS — J45909 Unspecified asthma, uncomplicated: Secondary | ICD-10-CM | POA: Diagnosis not present

## 2020-11-19 DIAGNOSIS — K219 Gastro-esophageal reflux disease without esophagitis: Secondary | ICD-10-CM | POA: Diagnosis not present

## 2020-12-01 DIAGNOSIS — Z23 Encounter for immunization: Secondary | ICD-10-CM | POA: Diagnosis not present

## 2020-12-01 DIAGNOSIS — G5792 Unspecified mononeuropathy of left lower limb: Secondary | ICD-10-CM | POA: Diagnosis not present

## 2020-12-01 DIAGNOSIS — I10 Essential (primary) hypertension: Secondary | ICD-10-CM | POA: Diagnosis not present

## 2020-12-01 DIAGNOSIS — E782 Mixed hyperlipidemia: Secondary | ICD-10-CM | POA: Diagnosis not present

## 2020-12-01 DIAGNOSIS — G5603 Carpal tunnel syndrome, bilateral upper limbs: Secondary | ICD-10-CM | POA: Diagnosis not present

## 2020-12-01 DIAGNOSIS — E875 Hyperkalemia: Secondary | ICD-10-CM | POA: Diagnosis not present

## 2020-12-01 DIAGNOSIS — M329 Systemic lupus erythematosus, unspecified: Secondary | ICD-10-CM | POA: Diagnosis not present

## 2020-12-01 DIAGNOSIS — K219 Gastro-esophageal reflux disease without esophagitis: Secondary | ICD-10-CM | POA: Diagnosis not present

## 2020-12-01 DIAGNOSIS — N1832 Chronic kidney disease, stage 3b: Secondary | ICD-10-CM | POA: Diagnosis not present

## 2020-12-01 DIAGNOSIS — E1122 Type 2 diabetes mellitus with diabetic chronic kidney disease: Secondary | ICD-10-CM | POA: Diagnosis not present

## 2020-12-01 DIAGNOSIS — I129 Hypertensive chronic kidney disease with stage 1 through stage 4 chronic kidney disease, or unspecified chronic kidney disease: Secondary | ICD-10-CM | POA: Diagnosis not present

## 2020-12-31 DIAGNOSIS — E875 Hyperkalemia: Secondary | ICD-10-CM | POA: Diagnosis not present

## 2020-12-31 DIAGNOSIS — D631 Anemia in chronic kidney disease: Secondary | ICD-10-CM | POA: Diagnosis not present

## 2020-12-31 DIAGNOSIS — G5603 Carpal tunnel syndrome, bilateral upper limbs: Secondary | ICD-10-CM | POA: Diagnosis not present

## 2020-12-31 DIAGNOSIS — K219 Gastro-esophageal reflux disease without esophagitis: Secondary | ICD-10-CM | POA: Diagnosis not present

## 2020-12-31 DIAGNOSIS — R059 Cough, unspecified: Secondary | ICD-10-CM | POA: Diagnosis not present

## 2020-12-31 DIAGNOSIS — I10 Essential (primary) hypertension: Secondary | ICD-10-CM | POA: Diagnosis not present

## 2020-12-31 DIAGNOSIS — J45909 Unspecified asthma, uncomplicated: Secondary | ICD-10-CM | POA: Diagnosis not present

## 2020-12-31 DIAGNOSIS — E1122 Type 2 diabetes mellitus with diabetic chronic kidney disease: Secondary | ICD-10-CM | POA: Diagnosis not present

## 2020-12-31 DIAGNOSIS — N1832 Chronic kidney disease, stage 3b: Secondary | ICD-10-CM | POA: Diagnosis not present

## 2020-12-31 DIAGNOSIS — R609 Edema, unspecified: Secondary | ICD-10-CM | POA: Diagnosis not present

## 2020-12-31 DIAGNOSIS — E782 Mixed hyperlipidemia: Secondary | ICD-10-CM | POA: Diagnosis not present

## 2021-01-28 IMAGING — US US THYROID
1 series · 13 of 25 positions shown · non-contrast
Comparison: None.

CLINICAL DATA: Other.  Thyroid disorder

EXAM:
THYROID ULTRASOUND
TECHNIQUE: Ultrasound examination of the thyroid gland and adjacent soft
tissues was performed.

[Series 1: us thyroid · 13 of 53 slices shown]
[im 1/53]
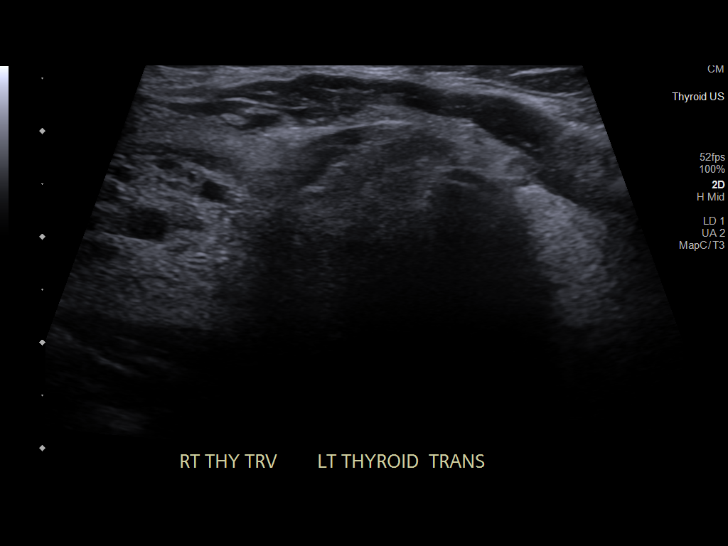
[im 5/53]
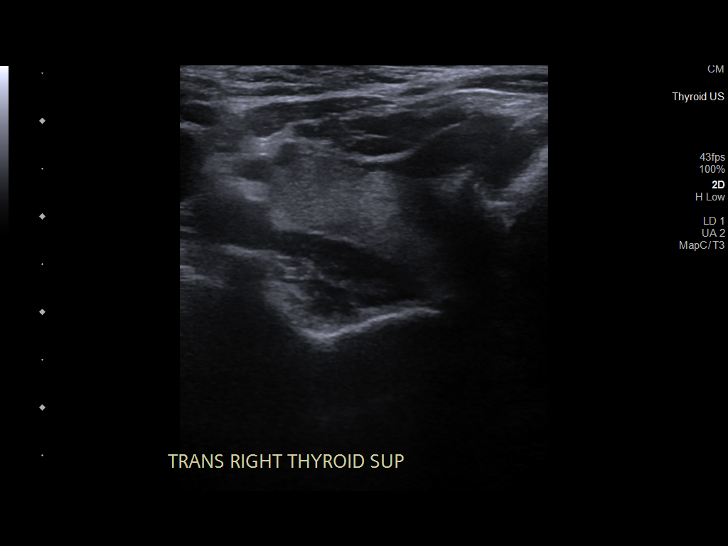
[im 9/53]
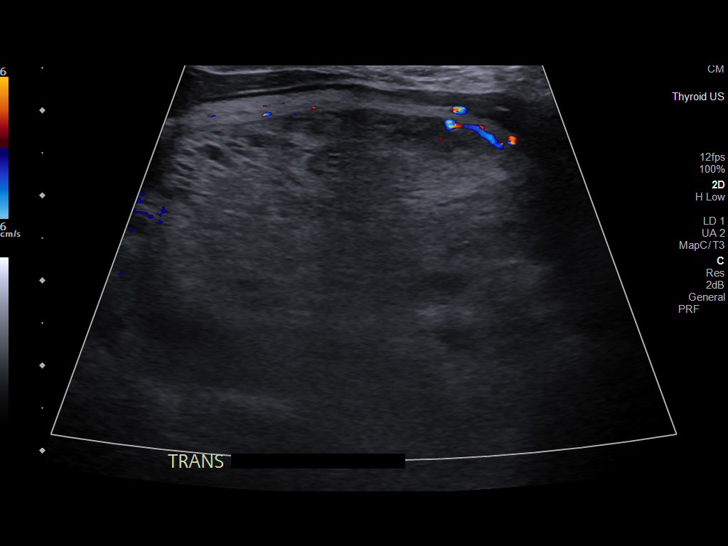
[im 14/53]
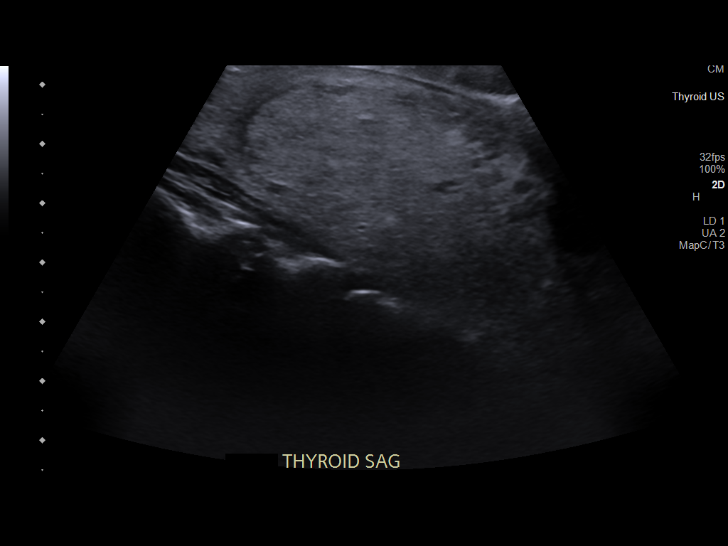
[im 18/53]
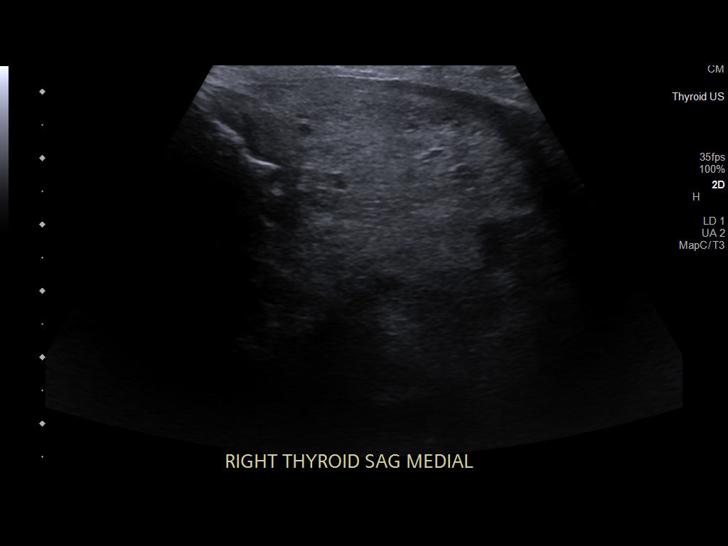
[im 22/53]
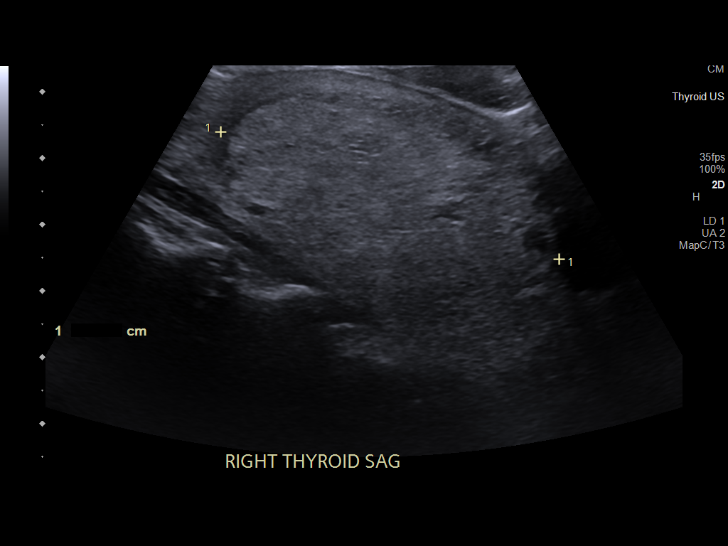
[im 27/53]
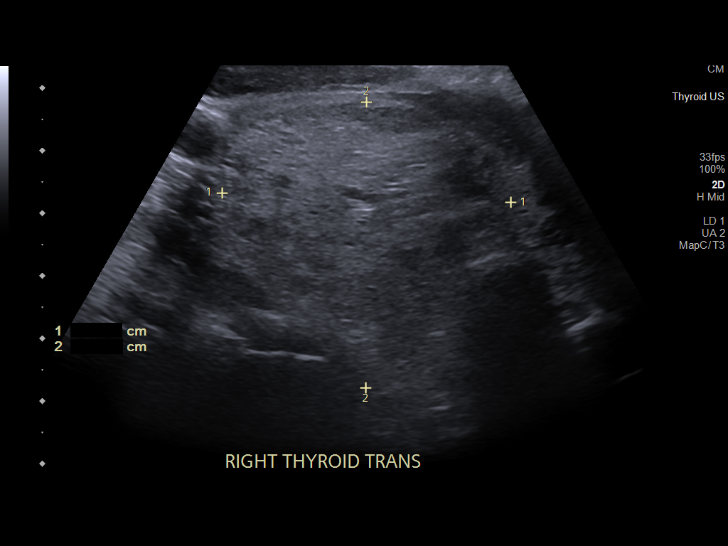
[im 31/53]
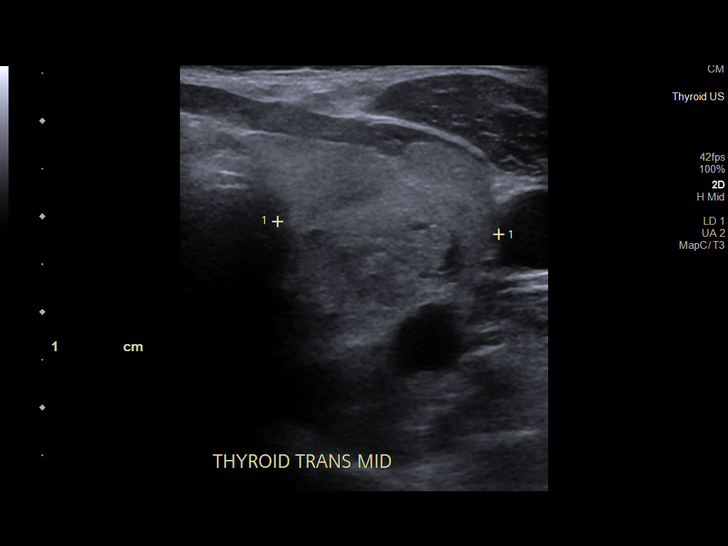
[im 35/53]
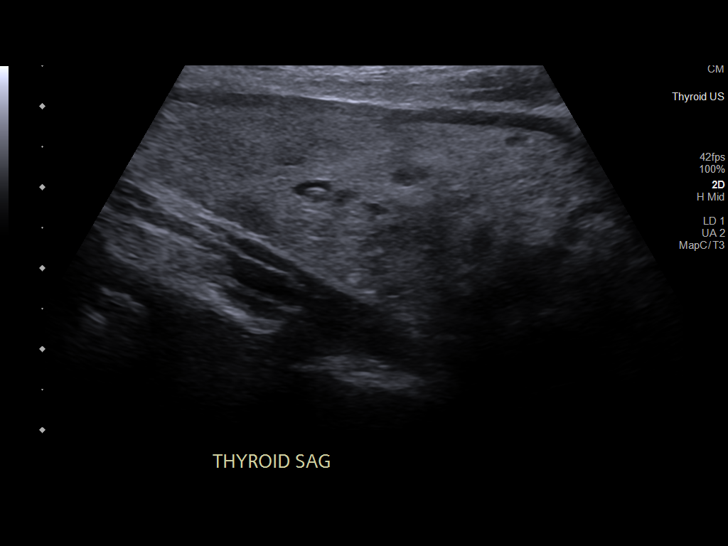
[im 40/53]
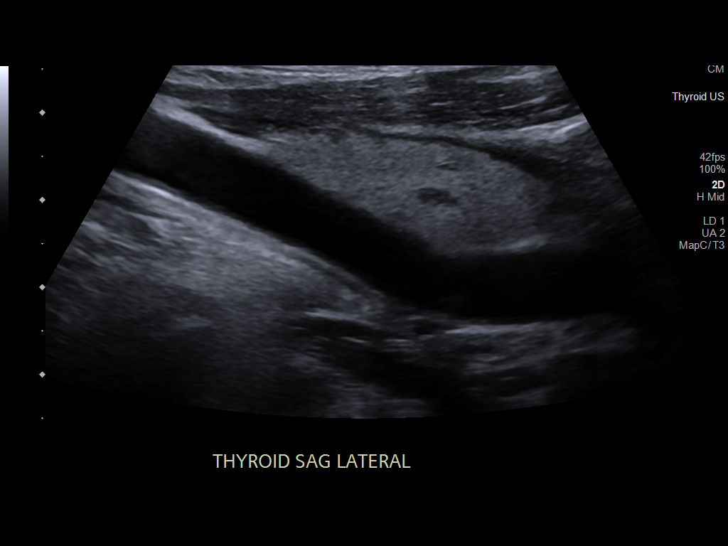
[im 44/53]
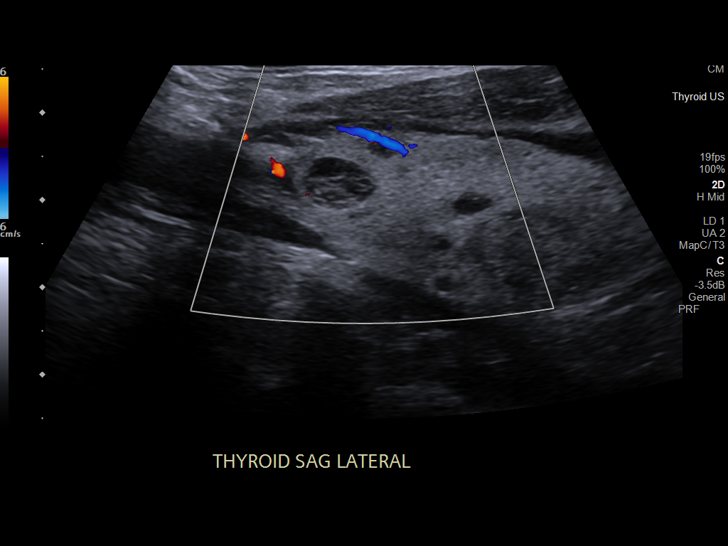
[im 48/53]
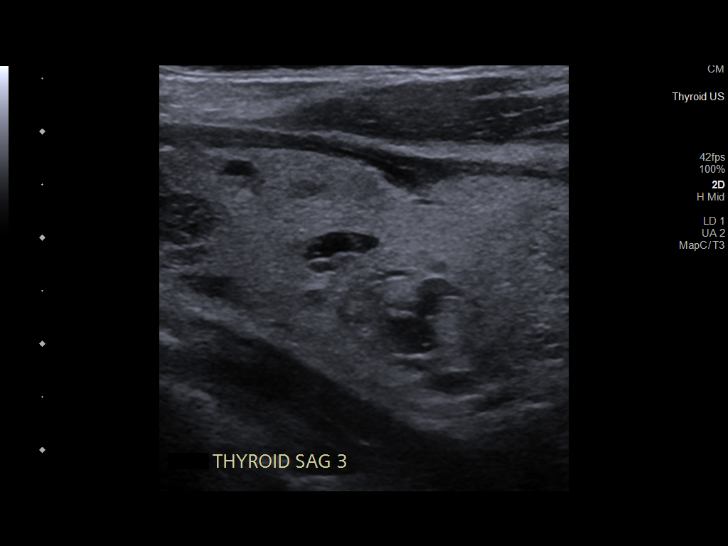
[im 53/53]
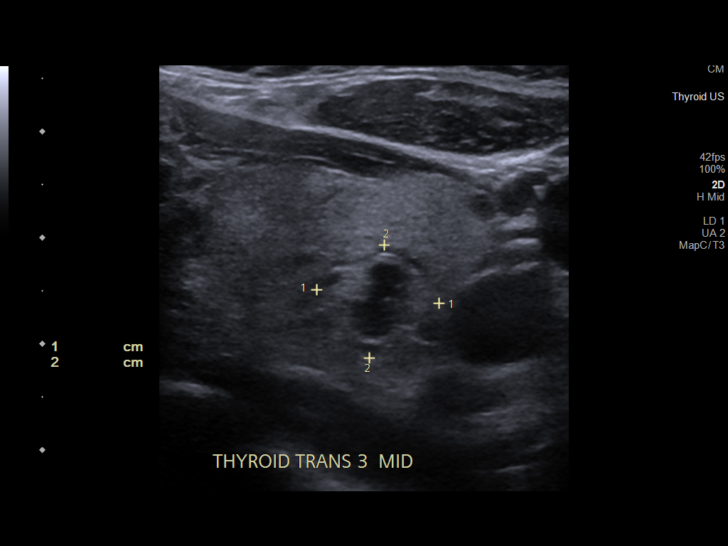

[13 of 25 positions shown; findings below may reference images not displayed]

FINDINGS: Parenchymal Echotexture: Moderately heterogenous

Isthmus: 0.4 cm

Right lobe: 8.4 x 3.3 x 5.7 cm

Left lobe: 6.7 x 2.7 x 2.3 cm

_________________________________________________________

Estimated total number of nodules >/= 1 cm: 2

Number of spongiform nodules >/=  2 cm not described below (TR1): 0

Number of mixed cystic and solid nodules >/= 1.5 cm not described
below (TR2): 0

_________________________________________________________

Nodule # 1:

Location: Right; Inferior

Maximum size: 5.5 cm; Other 2 dimensions: 4.6 x 4.6 cm

Composition: solid/almost completely solid (2)

Echogenicity: isoechoic (1)

Shape: not taller-than-wide (0)

Margins: ill-defined (0)

Echogenic foci: none (0)

ACR TI-RADS total points: 3.

ACR TI-RADS risk category: TR3 (3 points).

ACR TI-RADS recommendations:

**Given size (>/= 2.5 cm) and appearance, fine needle aspiration of
this mildly suspicious nodule should be considered based on TI-RADS
criteria.

_________________________________________________________

Additional scattered small cysts, spongiform nodules and complex
cystic nodules scattered throughout the left gland. None of these
abnormalities meets criteria to warrant biopsy or further imaging
follow-up.
IMPRESSION: 1. Diffusely enlarged, heterogeneous and multinodular thyroid gland.
The sonographic appearance is most consistent with that of a
multinodular goiter.
2. A 5.5 cm TI-RADS category 3 (mildly suspicious) mass in the right
inferior thyroid gland meets criteria to consider fine-needle
aspiration biopsy.

The above is in keeping with the ACR TI-RADS recommendations - [HOSPITAL] 6749;[DATE].

## 2021-01-29 DIAGNOSIS — G5792 Unspecified mononeuropathy of left lower limb: Secondary | ICD-10-CM | POA: Diagnosis not present

## 2021-01-29 DIAGNOSIS — E782 Mixed hyperlipidemia: Secondary | ICD-10-CM | POA: Diagnosis not present

## 2021-01-29 DIAGNOSIS — E1122 Type 2 diabetes mellitus with diabetic chronic kidney disease: Secondary | ICD-10-CM | POA: Diagnosis not present

## 2021-01-29 DIAGNOSIS — M329 Systemic lupus erythematosus, unspecified: Secondary | ICD-10-CM | POA: Diagnosis not present

## 2021-01-29 DIAGNOSIS — R609 Edema, unspecified: Secondary | ICD-10-CM | POA: Diagnosis not present

## 2021-01-29 DIAGNOSIS — N1832 Chronic kidney disease, stage 3b: Secondary | ICD-10-CM | POA: Diagnosis not present

## 2021-01-29 DIAGNOSIS — I10 Essential (primary) hypertension: Secondary | ICD-10-CM | POA: Diagnosis not present

## 2021-01-30 DIAGNOSIS — K219 Gastro-esophageal reflux disease without esophagitis: Secondary | ICD-10-CM | POA: Diagnosis not present

## 2021-01-30 DIAGNOSIS — J45909 Unspecified asthma, uncomplicated: Secondary | ICD-10-CM | POA: Diagnosis not present

## 2021-01-30 DIAGNOSIS — N1832 Chronic kidney disease, stage 3b: Secondary | ICD-10-CM | POA: Diagnosis not present

## 2021-01-30 DIAGNOSIS — E875 Hyperkalemia: Secondary | ICD-10-CM | POA: Diagnosis not present

## 2021-01-30 DIAGNOSIS — D631 Anemia in chronic kidney disease: Secondary | ICD-10-CM | POA: Diagnosis not present

## 2021-01-30 DIAGNOSIS — I10 Essential (primary) hypertension: Secondary | ICD-10-CM | POA: Diagnosis not present

## 2021-01-30 DIAGNOSIS — G5603 Carpal tunnel syndrome, bilateral upper limbs: Secondary | ICD-10-CM | POA: Diagnosis not present

## 2021-01-30 DIAGNOSIS — R609 Edema, unspecified: Secondary | ICD-10-CM | POA: Diagnosis not present

## 2021-01-30 DIAGNOSIS — E1122 Type 2 diabetes mellitus with diabetic chronic kidney disease: Secondary | ICD-10-CM | POA: Diagnosis not present

## 2021-01-30 DIAGNOSIS — G5792 Unspecified mononeuropathy of left lower limb: Secondary | ICD-10-CM | POA: Diagnosis not present

## 2021-01-30 DIAGNOSIS — E782 Mixed hyperlipidemia: Secondary | ICD-10-CM | POA: Diagnosis not present

## 2021-01-30 DIAGNOSIS — M329 Systemic lupus erythematosus, unspecified: Secondary | ICD-10-CM | POA: Diagnosis not present

## 2021-02-04 DIAGNOSIS — M25511 Pain in right shoulder: Secondary | ICD-10-CM | POA: Diagnosis not present

## 2021-02-08 DIAGNOSIS — Z23 Encounter for immunization: Secondary | ICD-10-CM | POA: Diagnosis not present

## 2021-02-08 DIAGNOSIS — J4530 Mild persistent asthma, uncomplicated: Secondary | ICD-10-CM | POA: Diagnosis not present

## 2021-02-08 DIAGNOSIS — E1122 Type 2 diabetes mellitus with diabetic chronic kidney disease: Secondary | ICD-10-CM | POA: Diagnosis not present

## 2021-02-08 DIAGNOSIS — I129 Hypertensive chronic kidney disease with stage 1 through stage 4 chronic kidney disease, or unspecified chronic kidney disease: Secondary | ICD-10-CM | POA: Diagnosis not present

## 2021-02-08 DIAGNOSIS — E7849 Other hyperlipidemia: Secondary | ICD-10-CM | POA: Diagnosis not present

## 2021-02-08 DIAGNOSIS — Z0001 Encounter for general adult medical examination with abnormal findings: Secondary | ICD-10-CM | POA: Diagnosis not present

## 2021-02-19 DIAGNOSIS — E782 Mixed hyperlipidemia: Secondary | ICD-10-CM | POA: Diagnosis not present

## 2021-02-19 DIAGNOSIS — N1832 Chronic kidney disease, stage 3b: Secondary | ICD-10-CM | POA: Diagnosis not present

## 2021-02-19 DIAGNOSIS — G5792 Unspecified mononeuropathy of left lower limb: Secondary | ICD-10-CM | POA: Diagnosis not present

## 2021-02-19 DIAGNOSIS — R609 Edema, unspecified: Secondary | ICD-10-CM | POA: Diagnosis not present

## 2021-02-19 DIAGNOSIS — J45909 Unspecified asthma, uncomplicated: Secondary | ICD-10-CM | POA: Diagnosis not present

## 2021-02-19 DIAGNOSIS — I10 Essential (primary) hypertension: Secondary | ICD-10-CM | POA: Diagnosis not present

## 2021-02-19 DIAGNOSIS — E1122 Type 2 diabetes mellitus with diabetic chronic kidney disease: Secondary | ICD-10-CM | POA: Diagnosis not present

## 2021-02-19 DIAGNOSIS — K219 Gastro-esophageal reflux disease without esophagitis: Secondary | ICD-10-CM | POA: Diagnosis not present

## 2021-02-19 DIAGNOSIS — G5603 Carpal tunnel syndrome, bilateral upper limbs: Secondary | ICD-10-CM | POA: Diagnosis not present

## 2021-02-19 DIAGNOSIS — M329 Systemic lupus erythematosus, unspecified: Secondary | ICD-10-CM | POA: Diagnosis not present

## 2021-02-19 DIAGNOSIS — D631 Anemia in chronic kidney disease: Secondary | ICD-10-CM | POA: Diagnosis not present

## 2021-02-19 DIAGNOSIS — E875 Hyperkalemia: Secondary | ICD-10-CM | POA: Diagnosis not present

## 2021-02-24 ENCOUNTER — Emergency Department (HOSPITAL_COMMUNITY)
Admission: EM | Admit: 2021-02-24 | Discharge: 2021-02-24 | Disposition: A | Discharge status: Home / Self Care | Payer: Medicare Other | Attending: Emergency Medicine | Admitting: Emergency Medicine

## 2021-02-24 ENCOUNTER — Emergency Department (HOSPITAL_COMMUNITY): Payer: Medicare Other

## 2021-02-24 ENCOUNTER — Encounter (HOSPITAL_COMMUNITY): Payer: Self-pay

## 2021-02-24 ENCOUNTER — Other Ambulatory Visit: Payer: Self-pay

## 2021-02-24 DIAGNOSIS — Z7984 Long term (current) use of oral hypoglycemic drugs: Secondary | ICD-10-CM | POA: Diagnosis not present

## 2021-02-24 DIAGNOSIS — J810 Acute pulmonary edema: Secondary | ICD-10-CM | POA: Diagnosis not present

## 2021-02-24 DIAGNOSIS — Z87891 Personal history of nicotine dependence: Secondary | ICD-10-CM | POA: Diagnosis not present

## 2021-02-24 DIAGNOSIS — N184 Chronic kidney disease, stage 4 (severe): Secondary | ICD-10-CM | POA: Insufficient documentation

## 2021-02-24 DIAGNOSIS — Z79899 Other long term (current) drug therapy: Secondary | ICD-10-CM | POA: Diagnosis not present

## 2021-02-24 DIAGNOSIS — Z20822 Contact with and (suspected) exposure to covid-19: Secondary | ICD-10-CM | POA: Diagnosis not present

## 2021-02-24 DIAGNOSIS — E1122 Type 2 diabetes mellitus with diabetic chronic kidney disease: Secondary | ICD-10-CM | POA: Diagnosis not present

## 2021-02-24 DIAGNOSIS — I129 Hypertensive chronic kidney disease with stage 1 through stage 4 chronic kidney disease, or unspecified chronic kidney disease: Secondary | ICD-10-CM | POA: Insufficient documentation

## 2021-02-24 DIAGNOSIS — R600 Localized edema: Secondary | ICD-10-CM | POA: Diagnosis not present

## 2021-02-24 DIAGNOSIS — R0602 Shortness of breath: Secondary | ICD-10-CM | POA: Diagnosis not present

## 2021-02-24 LAB — BASIC METABOLIC PANEL
Anion gap: 10 (ref 5–15)
BUN: 24 mg/dL — ABNORMAL HIGH (ref 8–23)
CO2: 22 mmol/L (ref 22–32)
Calcium: 8.4 mg/dL — ABNORMAL LOW (ref 8.9–10.3)
Chloride: 103 mmol/L (ref 98–111)
Creatinine, Ser: 1.84 mg/dL — ABNORMAL HIGH (ref 0.44–1.00)
GFR, Estimated: 29 mL/min — ABNORMAL LOW (ref >60)
Glucose, Bld: 119 mg/dL — ABNORMAL HIGH (ref 70–99)
Potassium: 4.4 mmol/L (ref 3.5–5.1)
Sodium: 135 mmol/L (ref 135–145)

## 2021-02-24 LAB — CBC WITH DIFFERENTIAL/PLATELET
Abs Immature Granulocytes: 0.17 10*3/uL — ABNORMAL HIGH (ref 0.00–0.07)
Basophils Absolute: 0 10*3/uL (ref 0.0–0.1)
Basophils Relative: 0 %
Eosinophils Absolute: 0.2 10*3/uL (ref 0.0–0.5)
Eosinophils Relative: 2 %
HCT: 31.5 % — ABNORMAL LOW (ref 36.0–46.0)
Hemoglobin: 10.1 g/dL — ABNORMAL LOW (ref 12.0–15.0)
Immature Granulocytes: 2 %
Lymphocytes Relative: 18 %
Lymphs Abs: 1.7 10*3/uL (ref 0.7–4.0)
MCH: 31.4 pg (ref 26.0–34.0)
MCHC: 32.1 g/dL (ref 30.0–36.0)
MCV: 97.8 fL (ref 80.0–100.0)
Monocytes Absolute: 1.1 10*3/uL — ABNORMAL HIGH (ref 0.1–1.0)
Monocytes Relative: 11 %
Neutro Abs: 6.4 10*3/uL (ref 1.7–7.7)
Neutrophils Relative %: 67 %
Platelets: 242 10*3/uL (ref 150–400)
RBC: 3.22 MIL/uL — ABNORMAL LOW (ref 3.87–5.11)
RDW: 13.6 % (ref 11.5–15.5)
WBC: 9.6 10*3/uL (ref 4.0–10.5)
nRBC: 0 % (ref 0.0–0.2)

## 2021-02-24 LAB — RESP PANEL BY RT-PCR (FLU A&B, COVID) ARPGX2
Influenza A by PCR: NEGATIVE
Influenza B by PCR: NEGATIVE
SARS Coronavirus 2 by RT PCR: NEGATIVE

## 2021-02-24 LAB — BRAIN NATRIURETIC PEPTIDE: B Natriuretic Peptide: 76 pg/mL (ref 0.0–100.0)

## 2021-02-24 LAB — TROPONIN I (HIGH SENSITIVITY): Troponin I (High Sensitivity): 9 ng/L (ref <18)

## 2021-02-24 MED ORDER — FUROSEMIDE 10 MG/ML IJ SOLN
40.0000 mg | Freq: Once | INTRAMUSCULAR | Status: AC
  Administered 2021-02-24: 40 mg via INTRAVENOUS
  Filled 2021-02-24: qty 4

## 2021-02-24 MED ORDER — FUROSEMIDE 40 MG PO TABS: ORAL_TABLET | ORAL | 0 refills | Status: DC

## 2021-02-24 MED ORDER — DOXYCYCLINE HYCLATE 100 MG PO TABS
100.0000 mg | ORAL_TABLET | Freq: Once | ORAL | Status: AC
  Administered 2021-02-24: 100 mg via ORAL
  Filled 2021-02-24: qty 1

## 2021-02-24 MED ORDER — DOXYCYCLINE HYCLATE 100 MG PO CAPS: 100.0000 mg | ORAL_CAPSULE | Freq: Two times a day (BID) | ORAL | 0 refills | Status: DC

## 2021-02-24 NOTE — ED Triage Notes (Signed)
Shortness of breath since Tuesday. Went to PCP the same day.

## 2021-02-24 NOTE — Discharge Instructions (Addendum)
Increase lasix to 40 mg in the morning and 20 mg at night.   You will need to follow up with cardiology for your fluid in your lungs and with nephrology for your kidneys.

## 2021-02-24 NOTE — ED Notes (Signed)
Pt ambulatory with no difficulty or distress noted. Pt O2 sats remained at 97% or better.

## 2021-02-24 NOTE — ED Provider Notes (Signed)
[facility] EMERGENCY DEPARTMENT Provider Note   CSN: [id number] Arrival date & time: 02/24/21  2036     History Chief Complaint  Patient presents with  . Shortness of Breath    Allison Gould is a 70 y.o. female.  Pt presents to the ED today with sob.  The pt said she's been sob for several days.  She is on lasix and her pcp told her to increase her lasix.  She said that did not get put in were her medication regimen, so she has not been doing this.  Sob worse with exertion.  No f/c.  No cough.        Past Medical History:  Diagnosis Date  . Anxiety   . Diabetes mellitus type II   . GERD (gastroesophageal reflux disease)   . Hearing aid worn    right  . Hypertension   . Lipid disorder   . Loss of hair    for unknown reason, to see MD   . Low back pain   . Morbid obesity with BMI of 45.0-49.9, adult ([place])   . Osteoarthritis     Patient Active Problem List   Diagnosis Date Noted  . 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 Leg weakness 04/14/2013  . Back pain 04/14/2013  . Knee pain 04/14/2013  . Pain in joint, shoulder region 03/11/2013  . Muscle weakness (generalized) 03/11/2013  . Pain in joint, ankle and foot 03/11/2013  . Abnormality of gait 03/11/2013  . Right ankle sprain 03/02/2013  . Shoulder contusion 03/02/2013  . Constipation 06/22/2012  . Colon cancer screening 06/22/2012  . Pain in limb 07/05/2009  . FLANK PAIN, RIGHT 04/04/2009  . ELECTROCARDIOGRAM, ABNORMAL 02/20/2009  . DIABETES MELLITUS, WITH NEUROLOGICAL COMPLICATIONS 00/93/8182  . HYPERTENSION 10/13/2008  . OSTEOARTHRITIS 10/13/2008  . LOW BACK PAIN 10/13/2008    Past Surgical History:  Procedure Laterality Date  . CATARACT EXTRACTION W/PHACO Left 05/12/2016   Procedure: CATARACT EXTRACTION PHACO AND INTRAOCULAR LENS PLACEMENT (IOC);  Surgeon: Tonny Branch, MD;  Location: [facility];  Service: Ophthalmology;  Laterality: Left;  CDE: 6.63  .  CATARACT EXTRACTION W/PHACO Right 06/16/2016   Procedure: CATARACT EXTRACTION PHACO AND INTRAOCULAR LENS PLACEMENT (IOC); CDE:  4.13;  Surgeon: Tonny Branch, MD;  Location: [facility] ORS;  Service: Ophthalmology;  Laterality: Right;  . COLONOSCOPY  07/12/2012   Procedure: COLONOSCOPY;  Surgeon: Danie Binder, MD;  Location: [facility] ENDO SUITE;  Service: Endoscopy;  Laterality: N/A;  1:00  . lipoma-right shoulder    . spur and nerve repair right shoulder       OB History   No obstetric history on file.     Family History  Problem Relation Age of Onset  . Lupus Sister   . Colon cancer Neg Hx   . Liver disease Neg Hx   . Inflammatory bowel disease Neg Hx     Social History   Tobacco Use  . Smoking status: Former Smoker    Packs/day: 0.50    Types: Cigarettes  . Smokeless tobacco: Never Used  . Tobacco comment: quit years ago  Substance Use Topics  . Alcohol use: No  . Drug use: No    Home Medications Prior to Admission medications   Medication Sig Start Date End Date Taking? Authorizing Provider  doxycycline (VIBRAMYCIN) 100 MG capsule Take 1 capsule (100 mg total) by mouth 2 (two) times daily. 02/24/21  Yes Isla Pence, MD  acetaminophen (TYLENOL) 500  MG tablet Take 1,000 mg by mouth every 6 (six) hours as needed for mild pain.    [provider]  amLODipine (NORVASC) 10 MG tablet Take 10 mg by mouth daily.  06/05/12   [provider]  betamethasone dipropionate (DIPROLENE) 0.05 % cream Apply topically daily.    [provider]  Carboxymethylcellul-Glycerin 0.5-0.9 % SOLN Place 1 drop into both eyes daily as needed. Dry Eyes    [provider]  cholecalciferol (VITAMIN D) 1000 UNITS tablet Take 1,000 Units by mouth daily.    [provider]  cyclobenzaprine (FLEXERIL) 10 MG tablet Take 10 mg by mouth 3 (three) times daily as needed. Muscle Spasms    [provider]  furosemide (LASIX) 40 MG tablet Take 40 mg in the morning and 20 mg  (1/2 tab) in the evening. 02/24/21   Isla Pence, MD  hydroxychloroquine (PLAQUENIL) 200 MG tablet Take 200 mg by mouth 2 (two) times daily.    [provider]  lisinopril (PRINIVIL,ZESTRIL) 40 MG tablet Take 40 mg by mouth daily.  06/05/12   [provider]  metFORMIN (GLUCOPHAGE) 500 MG tablet Take 500 mg by mouth 2 (two) times daily with a meal.  05/07/12   [provider]  nitroGLYCERIN (NITROSTAT) 0.4 MG SL tablet Place 0.4 mg under the tongue every 5 (five) minutes as needed. Chest Pain    [provider]  omeprazole (PRILOSEC) 20 MG capsule Take 20 mg by mouth daily.  06/05/12   [provider]  polyethylene glycol powder (GLYCOLAX/MIRALAX) powder MIX 1 CAPFUL (17 GRAMS) WITH 8OZ OF WATER OR JUICE DAILY. Patient not taking: Reported on 12/22/2018 05/01/16   Carlis Stable, NP  spironolactone (ALDACTONE) 25 MG tablet Take 25 mg by mouth 2 (two) times daily.  05/07/12   [provider]    Allergies    Patient has no allergy information on record.  Review of Systems   Review of Systems  Respiratory: Positive for shortness of breath.   Cardiovascular: Positive for leg swelling.  All other systems reviewed and are negative.   Physical Exam Updated Vital Signs BP (!) 134/54   Pulse 78   Temp 98.4 F (36.9 C)   Resp (!) 22   Ht 5\' 5"  (1.651 m)   Wt (!) 139.7 kg   SpO2 99%   BMI 51.25 kg/m   Physical Exam Vitals and nursing note reviewed.  Constitutional:      Appearance: She is well-developed. She is obese.  HENT:     Head: Normocephalic and atraumatic.     Mouth/Throat:     Mouth: Mucous membranes are moist.  Eyes:     Extraocular Movements: Extraocular movements intact.     Pupils: Pupils are equal, round, and reactive to light.  Cardiovascular:     Rate and Rhythm: Normal rate and regular rhythm.  Pulmonary:     Effort: Tachypnea present.     Breath sounds: Rhonchi present.  Abdominal:     General: Bowel sounds are  normal.     Palpations: Abdomen is soft.  Musculoskeletal:        General: Normal range of motion.     Cervical back: Normal range of motion and neck supple.     Right lower leg: Edema present.     Left lower leg: Edema present.  Skin:    General: Skin is warm and dry.     Capillary Refill: Capillary refill takes less than 2 seconds.  Neurological:  General: No focal deficit present.     Mental Status: She is alert and oriented to person, place, and time.  Psychiatric:        Mood and Affect: Mood normal.        Behavior: Behavior normal.     ED Results / Procedures / Treatments   Labs (all labs ordered are listed, but only abnormal results are displayed) Labs Reviewed  BASIC METABOLIC PANEL - Abnormal; Notable for the following components:      Result Value   Glucose, Bld 119 (*)    BUN 24 (*)    Creatinine, Ser 1.84 (*)    Calcium 8.4 (*)    GFR, Estimated 29 (*)    All other components within normal limits  CBC WITH DIFFERENTIAL/PLATELET - Abnormal; Notable for the following components:   RBC 3.22 (*)    Hemoglobin 10.1 (*)    HCT 31.5 (*)    Monocytes Absolute 1.1 (*)    Abs Immature Granulocytes 0.17 (*)    All other components within normal limits  RESP PANEL BY RT-PCR (FLU A&B, COVID) ARPGX2  BRAIN NATRIURETIC PEPTIDE  TROPONIN I (HIGH SENSITIVITY)    EKG EKG Interpretation  Date/Time:  Sunday February 24 2021 21:06:59 EDT Ventricular Rate:  99 PR Interval:  134 QRS Duration: 93 QT Interval:  348 QTC Calculation: 447 R Axis:   -29 Text Interpretation: Sinus rhythm Borderline left axis deviation No significant change since last tracing Confirmed by Isla Pence [phone]) on 02/24/2021 9:31:46 PM   Radiology DG Chest Port 1 View  Result Date: 02/24/2021 CLINICAL DATA:  Shortness of breath. EXAM: PORTABLE CHEST 1 VIEW COMPARISON:  Radiograph 08/31/2008 FINDINGS: Upper normal heart size and is similar to prior exam. Unchanged mediastinal contours.  Ill-defined patchy left perihilar opacity. Overall low lung volumes. No pneumothorax or significant effusion. Surgical anchors in the right proximal humerus. IMPRESSION: Ill-defined patchy left perihilar opacity, atelectasis versus pneumonia. Electronically Signed   By: Keith Rake M.D.   On: 02/24/2021 21:43    Procedures Procedures   Medications Ordered in ED Medications  doxycycline (VIBRA-TABS) tablet 100 mg (has no administration in time range)  furosemide (LASIX) injection 40 mg (40 mg Intravenous Given 02/24/21 2113)    ED Course  I have reviewed the triage vital signs and the nursing notes.  Pertinent labs & imaging results that were available during my care of the patient were reviewed by me and considered in my medical decision making (see chart for details).    MDM Rules/Calculators/A&P                          Pt does have some mild pulmonary edema on CXR.  Pt is feeling better after IV lasix.  She is able to ambulate without problems.    Pt has a hx of CKD, but has not seen a nephrologist.  She is encouraged to f/u with one.  She also needs to see cardiology for her pulmonary edema.  No ECHO in epic.  Pt is instructed to increase lasix from 40 in the am to 40 am and 20 pm.  ? pna on CXR, so she will be started on doxy.  Pt is stable for d/c.  She knows to return if worse.   Final Clinical Impression(s) / ED Diagnoses Final diagnoses:  Acute pulmonary edema (HCC)  CKD (chronic kidney disease) stage 4, GFR 15-29 ml/min ([place])    Rx / DC Orders  ED Discharge Orders         Ordered    Ambulatory referral to Cardiology        02/24/21 2301    doxycycline (VIBRAMYCIN) 100 MG capsule  2 times daily        02/24/21 2305    furosemide (LASIX) 40 MG tablet        02/24/21 2305           Isla Pence, MD 02/24/21 2308

## 2021-03-03 DIAGNOSIS — K219 Gastro-esophageal reflux disease without esophagitis: Secondary | ICD-10-CM | POA: Diagnosis not present

## 2021-03-04 DIAGNOSIS — L669 Cicatricial alopecia, unspecified: Secondary | ICD-10-CM | POA: Diagnosis not present

## 2021-03-04 DIAGNOSIS — L930 Discoid lupus erythematosus: Secondary | ICD-10-CM | POA: Diagnosis not present

## 2021-03-04 DIAGNOSIS — Z5181 Encounter for therapeutic drug level monitoring: Secondary | ICD-10-CM | POA: Diagnosis not present

## 2021-03-04 DIAGNOSIS — Z79899 Other long term (current) drug therapy: Secondary | ICD-10-CM | POA: Diagnosis not present

## 2021-03-13 ENCOUNTER — Other Ambulatory Visit: Payer: Self-pay | Admitting: Nephrology

## 2021-03-13 ENCOUNTER — Other Ambulatory Visit (HOSPITAL_COMMUNITY): Payer: Self-pay | Admitting: Nephrology

## 2021-03-13 DIAGNOSIS — E1122 Type 2 diabetes mellitus with diabetic chronic kidney disease: Secondary | ICD-10-CM | POA: Diagnosis not present

## 2021-03-13 DIAGNOSIS — N189 Chronic kidney disease, unspecified: Secondary | ICD-10-CM | POA: Diagnosis not present

## 2021-03-13 DIAGNOSIS — I509 Heart failure, unspecified: Secondary | ICD-10-CM | POA: Diagnosis not present

## 2021-03-13 DIAGNOSIS — I129 Hypertensive chronic kidney disease with stage 1 through stage 4 chronic kidney disease, or unspecified chronic kidney disease: Secondary | ICD-10-CM

## 2021-03-13 DIAGNOSIS — Z79899 Other long term (current) drug therapy: Secondary | ICD-10-CM | POA: Diagnosis not present

## 2021-03-13 DIAGNOSIS — R768 Other specified abnormal immunological findings in serum: Secondary | ICD-10-CM | POA: Diagnosis not present

## 2021-03-13 DIAGNOSIS — D638 Anemia in other chronic diseases classified elsewhere: Secondary | ICD-10-CM

## 2021-03-13 DIAGNOSIS — L930 Discoid lupus erythematosus: Secondary | ICD-10-CM | POA: Diagnosis not present

## 2021-03-27 ENCOUNTER — Other Ambulatory Visit (HOSPITAL_COMMUNITY): Payer: Self-pay | Admitting: Nephrology

## 2021-03-27 DIAGNOSIS — I509 Heart failure, unspecified: Secondary | ICD-10-CM

## 2021-03-28 DIAGNOSIS — R768 Other specified abnormal immunological findings in serum: Secondary | ICD-10-CM | POA: Diagnosis not present

## 2021-03-28 DIAGNOSIS — L930 Discoid lupus erythematosus: Secondary | ICD-10-CM | POA: Diagnosis not present

## 2021-03-28 DIAGNOSIS — I129 Hypertensive chronic kidney disease with stage 1 through stage 4 chronic kidney disease, or unspecified chronic kidney disease: Secondary | ICD-10-CM | POA: Diagnosis not present

## 2021-03-28 DIAGNOSIS — E559 Vitamin D deficiency, unspecified: Secondary | ICD-10-CM | POA: Diagnosis not present

## 2021-03-28 DIAGNOSIS — N189 Chronic kidney disease, unspecified: Secondary | ICD-10-CM | POA: Diagnosis not present

## 2021-03-28 DIAGNOSIS — E1122 Type 2 diabetes mellitus with diabetic chronic kidney disease: Secondary | ICD-10-CM | POA: Diagnosis not present

## 2021-03-28 DIAGNOSIS — Z79899 Other long term (current) drug therapy: Secondary | ICD-10-CM | POA: Diagnosis not present

## 2021-03-28 DIAGNOSIS — I509 Heart failure, unspecified: Secondary | ICD-10-CM | POA: Diagnosis not present

## 2021-04-08 ENCOUNTER — Other Ambulatory Visit: Payer: Self-pay

## 2021-04-08 ENCOUNTER — Other Ambulatory Visit (HOSPITAL_COMMUNITY)
Admission: RE | Admit: 2021-04-08 | Discharge: 2021-04-08 | Disposition: A | Discharge status: Home / Self Care | Payer: Medicare Other | Source: Ambulatory Visit | Attending: Internal Medicine | Admitting: Internal Medicine

## 2021-04-08 ENCOUNTER — Encounter: Payer: Self-pay | Admitting: Internal Medicine

## 2021-04-08 ENCOUNTER — Ambulatory Visit: Payer: Medicare Other | Admitting: Internal Medicine

## 2021-04-08 VITALS — BP 130/62 | HR 90 | Ht 65.0 in | Wt 297.0 lb

## 2021-04-08 DIAGNOSIS — I130 Hypertensive heart and chronic kidney disease with heart failure and stage 1 through stage 4 chronic kidney disease, or unspecified chronic kidney disease: Secondary | ICD-10-CM | POA: Diagnosis not present

## 2021-04-08 DIAGNOSIS — R0602 Shortness of breath: Secondary | ICD-10-CM | POA: Insufficient documentation

## 2021-04-08 DIAGNOSIS — Z79899 Other long term (current) drug therapy: Secondary | ICD-10-CM

## 2021-04-08 DIAGNOSIS — N189 Chronic kidney disease, unspecified: Secondary | ICD-10-CM | POA: Diagnosis not present

## 2021-04-08 DIAGNOSIS — E1122 Type 2 diabetes mellitus with diabetic chronic kidney disease: Secondary | ICD-10-CM | POA: Diagnosis not present

## 2021-04-08 LAB — LIPID PANEL
Cholesterol: 131 mg/dL (ref 0–200)
HDL: 51 mg/dL (ref >40)
LDL Cholesterol: 64 mg/dL (ref 0–99)
Total CHOL/HDL Ratio: 2.6 RATIO
Triglycerides: 82 mg/dL (ref <150)
VLDL: 16 mg/dL (ref 0–40)

## 2021-04-08 LAB — BASIC METABOLIC PANEL
Anion gap: 8 (ref 5–15)
BUN: 33 mg/dL — ABNORMAL HIGH (ref 8–23)
CO2: 27 mmol/L (ref 22–32)
Calcium: 9 mg/dL (ref 8.9–10.3)
Chloride: 104 mmol/L (ref 98–111)
Creatinine, Ser: 2.53 mg/dL — ABNORMAL HIGH (ref 0.44–1.00)
GFR, Estimated: 20 mL/min — ABNORMAL LOW (ref >60)
Glucose, Bld: 128 mg/dL — ABNORMAL HIGH (ref 70–99)
Potassium: 5.1 mmol/L (ref 3.5–5.1)
Sodium: 139 mmol/L (ref 135–145)

## 2021-04-08 LAB — TSH: TSH: 0.484 u[IU]/mL (ref 0.350–4.500)

## 2021-04-08 LAB — BRAIN NATRIURETIC PEPTIDE: B Natriuretic Peptide: 83 pg/mL (ref 0.0–100.0)

## 2021-04-08 NOTE — Patient Instructions (Signed)
Medication Instructions:  Your physician recommends that you continue on your current medications as directed. Please refer to the Current Medication list given to you today.  *If you need a refill on your cardiac medications before your next appointment, please call your pharmacy*   Lab Work: Your physician recommends that you return for lab work in: Today   If you have labs (blood work) drawn today and your tests are completely normal, you will receive your results only by: Marland Kitchen MyChart Message (if you have MyChart) OR . A paper copy in the mail If you have any lab test that is abnormal or we need to change your treatment, we will call you to review the results.   Testing/Procedures: None    Follow-Up: At [facility], you and your health needs are our priority.  As part of our continuing mission to provide you with exceptional heart care, we have created designated Provider Care Teams.  These Care Teams include your primary Cardiologist (physician) and Advanced Practice Providers (APPs -  Physician Assistants and Nurse Practitioners) who all work together to provide you with the care you need, when you need it.  We recommend signing up for the patient portal called "MyChart".  Sign up information is provided on this After Visit Summary.  MyChart is used to connect with patients for Virtual Visits (Telemedicine).  Patients are able to view lab/test results, encounter notes, upcoming appointments, etc.  Non-urgent messages can be sent to your provider as well.   To learn more about what you can do with MyChart, go to [email or web address].    Your next appointment:    Pending test results   The format for your next appointment:   In Person  Provider:   Dorris Carnes, MD   Other Instructions Thank you for choosing [facility]!

## 2021-04-08 NOTE — Progress Notes (Signed)
Cardiology Office Note   Date:  04/08/2021   ID:  Allison Gould, DOB 03/09/1951, MRN [id number]  PCP:  Celene Squibb, MD  Cardiologist:   Dorris Carnes, MD    Patient presents for follow-up of shortness of breath, volume overload.   History of Present Illness: Allison Gould is a 70 y.o. female with a history of CKD, DM, discoid lupus, GERD, HTN    Seen in ED on 02/24/21 for SOB  Several day hx  Treated with IV lasix and then PO dose increased  Pt says she has had SOB   Follows with  Dr.Bland  Says sshe has been put on breathing machine intermitt in the office    The pt says when laying down she will hear her self wheezing at times       Given IV in ed  Lasix increased to 1 then 1/2.  She notes some improvement in symptoms.  Patient notes occasional chest pains with climbin stairs.  G.  Not at other times.  Diet: Breakfast she will have eggs, bacon, cheese, coffee.  Orange juice.  Lunch: Juice and a salad.  Dinner: Meat and vegetables.  Snack: Fruit, chips.  Drinks: Juice and water.    Current Meds  Medication Sig  . acetaminophen (TYLENOL) 500 MG tablet Take 1,000 mg by mouth every 6 (six) hours as needed for mild pain.  Marland Kitchen albuterol (VENTOLIN HFA) 108 (90 Base) MCG/ACT inhaler Inhale 2 puffs into the lungs every 6 (six) hours as needed.  Marland Kitchen amLODipine (NORVASC) 10 MG tablet Take 10 mg by mouth daily.  . betamethasone dipropionate (DIPROLENE) 0.05 % cream Apply topically daily.  . Carboxymethylcellul-Glycerin 0.5-0.9 % SOLN Place 1 drop into both eyes daily as needed. Dry Eyes  . cholecalciferol (VITAMIN D) 1000 UNITS tablet Take 1,000 Units by mouth daily.  . furosemide (LASIX) 40 MG tablet Take 40 mg in the morning and 20 mg (1/2 tab) in the evening.  . hydroxychloroquine (PLAQUENIL) 200 MG tablet Take 200 mg by mouth 2 (two) times daily.  Marland Kitchen lisinopril (PRINIVIL,ZESTRIL) 40 MG tablet Take 40 mg by mouth daily.  Marland Kitchen omeprazole (PRILOSEC) 20 MG capsule Take 20 mg by mouth daily.  .  polyethylene glycol powder (GLYCOLAX/MIRALAX) powder MIX 1 CAPFUL (17 GRAMS) WITH 8OZ OF WATER OR JUICE DAILY.  . pravastatin (PRAVACHOL) 10 MG tablet Take 10 mg by mouth at bedtime.  . pregabalin (LYRICA) 50 MG capsule TAKE ONE TABLET BY MOUTH EVERYDAY AT BEDTIME FOR nerve pain  . spironolactone (ALDACTONE) 25 MG tablet Take 25 mg by mouth 2 (two) times daily.     Allergies:   Patient has no allergy information on record.   Past Medical History:  Diagnosis Date  . Anxiety   . Diabetes mellitus type II   . GERD (gastroesophageal reflux disease)   . Hearing aid worn    right  . Hypertension   . Lipid disorder   . Loss of hair    for unknown reason, to see MD   . Low back pain   . Morbid obesity with BMI of 45.0-49.9, adult ([place])   . Osteoarthritis     Past Surgical History:  Procedure Laterality Date  . CATARACT EXTRACTION W/PHACO Left 05/12/2016   Procedure: CATARACT EXTRACTION PHACO AND INTRAOCULAR LENS PLACEMENT (IOC);  Surgeon: Tonny Branch, MD;  Location: [facility];  Service: Ophthalmology;  Laterality: Left;  CDE: 6.63  . CATARACT EXTRACTION W/PHACO Right 06/16/2016   Procedure: CATARACT EXTRACTION PHACO AND  INTRAOCULAR LENS PLACEMENT (IOC); CDE:  4.13;  Surgeon: Tonny Branch, MD;  Location: [facility];  Service: Ophthalmology;  Laterality: Right;  . COLONOSCOPY  07/12/2012   Procedure: COLONOSCOPY;  Surgeon: Danie Binder, MD;  Location[facility];  Service: Endoscopy;  Laterality: N/A;  1:00  . lipoma-right shoulder    . spur and nerve repair right shoulder       Social History:  The patient  reports that she has quit smoking. Her smoking use included cigarettes. She smoked 0.50 packs per day. She has never used smokeless tobacco. She reports that she does not drink alcohol and does not use drugs.   Family History:  The patient's family history includes Lupus in her sister.    ROS:  Please see the history of present illness. All other systems are reviewed and  Negative to the  above problem except as noted.    PHYSICAL EXAM: VS:  BP 130/62   Pulse 90   Ht $R'5\' 5"'Ej$  (1.651 m)   Wt 297 lb (134.7 kg)   SpO2 97%   BMI 49.42 kg/m   GEN: Morbidly obese 70 year old, in no acute distress  HEENT: normal  Neck: no JVD, carotid bruits.  Prominent thyroid. Cardiac: RRR; no murmurs,s,no lower extremity edema  Respiratory: Decreased airflow with expiration.  No wheezes or rales GI: soft, nontender, nondistended, + BS  No hepatomegaly  MS: no deformity Moving all extremities   Skin: warm and dry, no rash Neuro:  Strength and sensation are intact Psych: euthymic mood, full affect   EKG:  EKG is not ordered today.   Lipid Panel    Component Value Date/Time   CHOL 149 10/13/2008 2314   TRIG 80 10/13/2008 2314   HDL 47 10/13/2008 2314   CHOLHDL 3.2 Ratio 10/13/2008 2314   VLDL 16 10/13/2008 2314   LDLCALC 86 10/13/2008 2314      Wt Readings from Last 3 Encounters:  04/08/21 297 lb (134.7 kg)  02/24/21 (!) 307 lb 15.7 oz (139.7 kg)  12/22/18 (!) 308 lb (139.7 kg)      ASSESSMENT AND PLAN:  1.  Shortness of breath.  Patient with recent ER visit on 424 with shortness of breath pulmonary edema.  Responded to IV Lasix.  Sent home on a higher dose of Lasix.  The patient has renal insufficiency(followed in renal clinic).  She also admits to  increased salt intake in her diet   I reviewed the importance of salt restriction with her    ALos recomm watching concentrated sweets.    The patient has been set up for an echocardiogram to evaluate the systolic and diastolic properties of the heart as well as valvular function.Marland Kitchen Appt set for later this will     I would set the pt  up for labs today (B met, TSH, BNP, lipid).  Follow-up based on these results.  2  Renal.  patient followed  in renal clinic.  BMET ordered today    3 pulmonary Patient uses inhalers.  Note decreased airflow on forced expiration.  Pt probably has some reactive airway disease  5 dyslipidemia.   Continue on pravastatin.  Check lipids today.  6.  Morbid obesity.  Discussed diet. She needs to cut back on saturated fats, salty foods but also cut way back and actually eliminate sugar sweetened beverages.  This should help with weight loss.    Follow-up based on test results   Current medicines are reviewed at length with the patient  today.  The patient does not have concerns regarding medicines.  Signed, Dorris Carnes, MD  04/08/2021 10:22 AM    [facility] [place], [place], [place]  [address] Phone: [phone]; Fax: [phone]

## 2021-04-10 ENCOUNTER — Ambulatory Visit (HOSPITAL_COMMUNITY)
Admission: RE | Admit: 2021-04-10 | Discharge: 2021-04-10 | Disposition: A | Discharge status: Home / Self Care | Payer: Medicare Other | Source: Ambulatory Visit | Attending: Nephrology | Admitting: Nephrology

## 2021-04-10 ENCOUNTER — Other Ambulatory Visit: Payer: Self-pay

## 2021-04-10 DIAGNOSIS — I509 Heart failure, unspecified: Secondary | ICD-10-CM | POA: Insufficient documentation

## 2021-04-10 DIAGNOSIS — R0602 Shortness of breath: Secondary | ICD-10-CM | POA: Diagnosis not present

## 2021-04-10 LAB — ECHOCARDIOGRAM COMPLETE
Area-P 1/2: 2.76 cm2
S' Lateral: 2.63 cm
Single Plane A4C EF: 73.1 %

## 2021-04-10 NOTE — Progress Notes (Signed)
  Echocardiogram 2D Echocardiogram has been performed.  Allison Gould 04/10/2021, 12:03 PM

## 2021-04-11 DIAGNOSIS — L930 Discoid lupus erythematosus: Secondary | ICD-10-CM | POA: Diagnosis not present

## 2021-04-11 DIAGNOSIS — E1122 Type 2 diabetes mellitus with diabetic chronic kidney disease: Secondary | ICD-10-CM | POA: Diagnosis not present

## 2021-04-11 DIAGNOSIS — E211 Secondary hyperparathyroidism, not elsewhere classified: Secondary | ICD-10-CM | POA: Diagnosis not present

## 2021-04-11 DIAGNOSIS — N170 Acute kidney failure with tubular necrosis: Secondary | ICD-10-CM | POA: Diagnosis not present

## 2021-04-11 DIAGNOSIS — N189 Chronic kidney disease, unspecified: Secondary | ICD-10-CM | POA: Diagnosis not present

## 2021-04-11 DIAGNOSIS — I517 Cardiomegaly: Secondary | ICD-10-CM | POA: Diagnosis not present

## 2021-04-11 DIAGNOSIS — D638 Anemia in other chronic diseases classified elsewhere: Secondary | ICD-10-CM | POA: Diagnosis not present

## 2021-04-15 ENCOUNTER — Telehealth: Payer: Self-pay | Admitting: *Deleted

## 2021-04-15 DIAGNOSIS — Z79899 Other long term (current) drug therapy: Secondary | ICD-10-CM

## 2021-04-15 MED ORDER — FUROSEMIDE 40 MG PO TABS: 40.0000 mg | ORAL_TABLET | Freq: Every day | ORAL | 11 refills | Status: AC

## 2021-04-15 NOTE — Telephone Encounter (Signed)
-----   Message from Fay Records, MD sent at 04/09/2021  9:41 PM EDT ----- Thyroid function is normal  Lipids are excellent Kidney function is a little worse than 1 month ago    Fluid number normal I would reocmm cutting back lasix to 40 mg dialy   Not 40/20 Check BMET and BNP in 2 wks     Daily weights

## 2021-04-22 ENCOUNTER — Other Ambulatory Visit: Payer: Self-pay

## 2021-04-22 ENCOUNTER — Ambulatory Visit (HOSPITAL_COMMUNITY)
Admission: RE | Admit: 2021-04-22 | Discharge: 2021-04-22 | Disposition: A | Discharge status: Home / Self Care | Payer: Medicare Other | Source: Ambulatory Visit | Attending: Nephrology | Admitting: Nephrology

## 2021-04-22 DIAGNOSIS — E1122 Type 2 diabetes mellitus with diabetic chronic kidney disease: Secondary | ICD-10-CM | POA: Insufficient documentation

## 2021-04-22 DIAGNOSIS — I129 Hypertensive chronic kidney disease with stage 1 through stage 4 chronic kidney disease, or unspecified chronic kidney disease: Secondary | ICD-10-CM | POA: Insufficient documentation

## 2021-04-22 DIAGNOSIS — D638 Anemia in other chronic diseases classified elsewhere: Secondary | ICD-10-CM | POA: Insufficient documentation

## 2021-04-22 DIAGNOSIS — N189 Chronic kidney disease, unspecified: Secondary | ICD-10-CM | POA: Diagnosis not present

## 2021-04-22 DIAGNOSIS — N261 Atrophy of kidney (terminal): Secondary | ICD-10-CM | POA: Diagnosis not present

## 2021-05-02 ENCOUNTER — Other Ambulatory Visit: Payer: Self-pay

## 2021-05-02 ENCOUNTER — Other Ambulatory Visit (HOSPITAL_COMMUNITY)
Admission: RE | Admit: 2021-05-02 | Discharge: 2021-05-02 | Disposition: A | Discharge status: Home / Self Care | Payer: Medicare Other | Source: Ambulatory Visit | Attending: Nephrology | Admitting: Nephrology

## 2021-05-02 DIAGNOSIS — D638 Anemia in other chronic diseases classified elsewhere: Secondary | ICD-10-CM | POA: Diagnosis not present

## 2021-05-02 DIAGNOSIS — K219 Gastro-esophageal reflux disease without esophagitis: Secondary | ICD-10-CM | POA: Diagnosis not present

## 2021-05-02 DIAGNOSIS — Z79899 Other long term (current) drug therapy: Secondary | ICD-10-CM

## 2021-05-02 DIAGNOSIS — N170 Acute kidney failure with tubular necrosis: Secondary | ICD-10-CM | POA: Insufficient documentation

## 2021-05-02 DIAGNOSIS — E1122 Type 2 diabetes mellitus with diabetic chronic kidney disease: Secondary | ICD-10-CM | POA: Diagnosis not present

## 2021-05-02 DIAGNOSIS — E875 Hyperkalemia: Secondary | ICD-10-CM | POA: Diagnosis not present

## 2021-05-02 DIAGNOSIS — N189 Chronic kidney disease, unspecified: Secondary | ICD-10-CM | POA: Diagnosis not present

## 2021-05-02 LAB — RENAL FUNCTION PANEL
Albumin: 3.8 g/dL (ref 3.5–5.0)
Anion gap: 7 (ref 5–15)
BUN: 47 mg/dL — ABNORMAL HIGH (ref 8–23)
CO2: 23 mmol/L (ref 22–32)
Calcium: 9.5 mg/dL (ref 8.9–10.3)
Chloride: 109 mmol/L (ref 98–111)
Creatinine, Ser: 2.47 mg/dL — ABNORMAL HIGH (ref 0.44–1.00)
GFR, Estimated: 20 mL/min — ABNORMAL LOW (ref >60)
Glucose, Bld: 107 mg/dL — ABNORMAL HIGH (ref 70–99)
Phosphorus: 3.9 mg/dL (ref 2.5–4.6)
Potassium: 5.2 mmol/L — ABNORMAL HIGH (ref 3.5–5.1)
Sodium: 139 mmol/L (ref 135–145)

## 2021-05-23 DIAGNOSIS — M25511 Pain in right shoulder: Secondary | ICD-10-CM | POA: Diagnosis not present

## 2021-05-24 DIAGNOSIS — K219 Gastro-esophageal reflux disease without esophagitis: Secondary | ICD-10-CM | POA: Insufficient documentation

## 2021-05-24 DIAGNOSIS — E785 Hyperlipidemia, unspecified: Secondary | ICD-10-CM | POA: Insufficient documentation

## 2021-06-02 DIAGNOSIS — K219 Gastro-esophageal reflux disease without esophagitis: Secondary | ICD-10-CM | POA: Diagnosis not present

## 2021-06-21 DIAGNOSIS — I10 Essential (primary) hypertension: Secondary | ICD-10-CM | POA: Diagnosis not present

## 2021-06-27 DIAGNOSIS — N189 Chronic kidney disease, unspecified: Secondary | ICD-10-CM | POA: Insufficient documentation

## 2021-06-27 DIAGNOSIS — D696 Thrombocytopenia, unspecified: Secondary | ICD-10-CM | POA: Insufficient documentation

## 2021-06-28 DIAGNOSIS — D696 Thrombocytopenia, unspecified: Secondary | ICD-10-CM | POA: Diagnosis not present

## 2021-06-28 DIAGNOSIS — E785 Hyperlipidemia, unspecified: Secondary | ICD-10-CM | POA: Diagnosis not present

## 2021-06-28 DIAGNOSIS — N189 Chronic kidney disease, unspecified: Secondary | ICD-10-CM | POA: Diagnosis not present

## 2021-06-28 DIAGNOSIS — I10 Essential (primary) hypertension: Secondary | ICD-10-CM | POA: Diagnosis not present

## 2021-07-03 DIAGNOSIS — K219 Gastro-esophageal reflux disease without esophagitis: Secondary | ICD-10-CM | POA: Diagnosis not present

## 2021-07-10 DIAGNOSIS — I129 Hypertensive chronic kidney disease with stage 1 through stage 4 chronic kidney disease, or unspecified chronic kidney disease: Secondary | ICD-10-CM | POA: Diagnosis not present

## 2021-07-10 DIAGNOSIS — R894 Abnormal immunological findings in specimens from other organs, systems and tissues: Secondary | ICD-10-CM | POA: Diagnosis not present

## 2021-07-10 DIAGNOSIS — D696 Thrombocytopenia, unspecified: Secondary | ICD-10-CM | POA: Diagnosis not present

## 2021-07-10 DIAGNOSIS — E875 Hyperkalemia: Secondary | ICD-10-CM | POA: Diagnosis not present

## 2021-07-10 DIAGNOSIS — N189 Chronic kidney disease, unspecified: Secondary | ICD-10-CM | POA: Diagnosis not present

## 2021-07-10 DIAGNOSIS — L930 Discoid lupus erythematosus: Secondary | ICD-10-CM | POA: Diagnosis not present

## 2021-07-10 DIAGNOSIS — N170 Acute kidney failure with tubular necrosis: Secondary | ICD-10-CM | POA: Diagnosis not present

## 2021-07-10 DIAGNOSIS — E1122 Type 2 diabetes mellitus with diabetic chronic kidney disease: Secondary | ICD-10-CM | POA: Diagnosis not present

## 2021-08-02 DIAGNOSIS — K219 Gastro-esophageal reflux disease without esophagitis: Secondary | ICD-10-CM | POA: Diagnosis not present

## 2021-08-21 DIAGNOSIS — F5104 Psychophysiologic insomnia: Secondary | ICD-10-CM | POA: Insufficient documentation

## 2021-09-02 DIAGNOSIS — K219 Gastro-esophageal reflux disease without esophagitis: Secondary | ICD-10-CM | POA: Diagnosis not present

## 2021-09-04 DIAGNOSIS — L930 Discoid lupus erythematosus: Secondary | ICD-10-CM | POA: Diagnosis not present

## 2021-09-04 DIAGNOSIS — Z5181 Encounter for therapeutic drug level monitoring: Secondary | ICD-10-CM | POA: Diagnosis not present

## 2021-09-04 DIAGNOSIS — L669 Cicatricial alopecia, unspecified: Secondary | ICD-10-CM | POA: Diagnosis not present

## 2021-09-04 DIAGNOSIS — Z79899 Other long term (current) drug therapy: Secondary | ICD-10-CM | POA: Diagnosis not present

## 2021-09-17 DIAGNOSIS — N189 Chronic kidney disease, unspecified: Secondary | ICD-10-CM | POA: Diagnosis not present

## 2021-09-17 DIAGNOSIS — N170 Acute kidney failure with tubular necrosis: Secondary | ICD-10-CM | POA: Diagnosis not present

## 2021-09-17 DIAGNOSIS — E559 Vitamin D deficiency, unspecified: Secondary | ICD-10-CM | POA: Diagnosis not present

## 2021-09-17 DIAGNOSIS — E1122 Type 2 diabetes mellitus with diabetic chronic kidney disease: Secondary | ICD-10-CM | POA: Diagnosis not present

## 2021-09-17 DIAGNOSIS — D696 Thrombocytopenia, unspecified: Secondary | ICD-10-CM | POA: Diagnosis not present

## 2021-09-24 DIAGNOSIS — D696 Thrombocytopenia, unspecified: Secondary | ICD-10-CM | POA: Diagnosis not present

## 2021-09-24 DIAGNOSIS — E211 Secondary hyperparathyroidism, not elsewhere classified: Secondary | ICD-10-CM | POA: Diagnosis not present

## 2021-09-24 DIAGNOSIS — N189 Chronic kidney disease, unspecified: Secondary | ICD-10-CM | POA: Diagnosis not present

## 2021-09-24 DIAGNOSIS — I129 Hypertensive chronic kidney disease with stage 1 through stage 4 chronic kidney disease, or unspecified chronic kidney disease: Secondary | ICD-10-CM | POA: Diagnosis not present

## 2021-09-24 DIAGNOSIS — D638 Anemia in other chronic diseases classified elsewhere: Secondary | ICD-10-CM | POA: Diagnosis not present

## 2021-09-24 DIAGNOSIS — E1122 Type 2 diabetes mellitus with diabetic chronic kidney disease: Secondary | ICD-10-CM | POA: Diagnosis not present

## 2021-09-24 DIAGNOSIS — R894 Abnormal immunological findings in specimens from other organs, systems and tissues: Secondary | ICD-10-CM | POA: Diagnosis not present

## 2021-09-25 DIAGNOSIS — L930 Discoid lupus erythematosus: Secondary | ICD-10-CM | POA: Insufficient documentation

## 2021-09-25 DIAGNOSIS — R7301 Impaired fasting glucose: Secondary | ICD-10-CM | POA: Diagnosis not present

## 2021-09-25 DIAGNOSIS — I10 Essential (primary) hypertension: Secondary | ICD-10-CM | POA: Diagnosis not present

## 2021-09-25 DIAGNOSIS — Z131 Encounter for screening for diabetes mellitus: Secondary | ICD-10-CM | POA: Diagnosis not present

## 2021-09-30 DIAGNOSIS — Z1239 Encounter for other screening for malignant neoplasm of breast: Secondary | ICD-10-CM | POA: Diagnosis not present

## 2021-09-30 DIAGNOSIS — R600 Localized edema: Secondary | ICD-10-CM | POA: Insufficient documentation

## 2021-09-30 DIAGNOSIS — N189 Chronic kidney disease, unspecified: Secondary | ICD-10-CM | POA: Diagnosis not present

## 2021-09-30 DIAGNOSIS — Z23 Encounter for immunization: Secondary | ICD-10-CM | POA: Diagnosis not present

## 2021-09-30 DIAGNOSIS — I10 Essential (primary) hypertension: Secondary | ICD-10-CM | POA: Diagnosis not present

## 2021-09-30 DIAGNOSIS — D696 Thrombocytopenia, unspecified: Secondary | ICD-10-CM | POA: Diagnosis not present

## 2021-09-30 DIAGNOSIS — L930 Discoid lupus erythematosus: Secondary | ICD-10-CM | POA: Diagnosis not present

## 2021-09-30 DIAGNOSIS — Z13820 Encounter for screening for osteoporosis: Secondary | ICD-10-CM | POA: Diagnosis not present

## 2021-09-30 DIAGNOSIS — D631 Anemia in chronic kidney disease: Secondary | ICD-10-CM | POA: Diagnosis not present

## 2021-09-30 DIAGNOSIS — E785 Hyperlipidemia, unspecified: Secondary | ICD-10-CM | POA: Diagnosis not present

## 2021-10-01 ENCOUNTER — Other Ambulatory Visit (HOSPITAL_COMMUNITY): Payer: Self-pay | Admitting: Family Medicine

## 2021-10-01 DIAGNOSIS — Z1231 Encounter for screening mammogram for malignant neoplasm of breast: Secondary | ICD-10-CM

## 2021-10-01 DIAGNOSIS — Z13820 Encounter for screening for osteoporosis: Secondary | ICD-10-CM

## 2021-10-02 DIAGNOSIS — K219 Gastro-esophageal reflux disease without esophagitis: Secondary | ICD-10-CM | POA: Diagnosis not present

## 2021-11-01 DIAGNOSIS — K219 Gastro-esophageal reflux disease without esophagitis: Secondary | ICD-10-CM | POA: Diagnosis not present

## 2021-12-02 DIAGNOSIS — D696 Thrombocytopenia, unspecified: Secondary | ICD-10-CM | POA: Diagnosis not present

## 2021-12-02 DIAGNOSIS — I129 Hypertensive chronic kidney disease with stage 1 through stage 4 chronic kidney disease, or unspecified chronic kidney disease: Secondary | ICD-10-CM | POA: Diagnosis not present

## 2021-12-02 DIAGNOSIS — N189 Chronic kidney disease, unspecified: Secondary | ICD-10-CM | POA: Diagnosis not present

## 2021-12-02 DIAGNOSIS — E1122 Type 2 diabetes mellitus with diabetic chronic kidney disease: Secondary | ICD-10-CM | POA: Diagnosis not present

## 2021-12-02 DIAGNOSIS — E211 Secondary hyperparathyroidism, not elsewhere classified: Secondary | ICD-10-CM | POA: Diagnosis not present

## 2021-12-03 DIAGNOSIS — K219 Gastro-esophageal reflux disease without esophagitis: Secondary | ICD-10-CM | POA: Diagnosis not present

## 2021-12-23 DIAGNOSIS — D638 Anemia in other chronic diseases classified elsewhere: Secondary | ICD-10-CM | POA: Diagnosis not present

## 2021-12-23 DIAGNOSIS — E211 Secondary hyperparathyroidism, not elsewhere classified: Secondary | ICD-10-CM | POA: Diagnosis not present

## 2021-12-23 DIAGNOSIS — N189 Chronic kidney disease, unspecified: Secondary | ICD-10-CM | POA: Diagnosis not present

## 2021-12-23 DIAGNOSIS — E1122 Type 2 diabetes mellitus with diabetic chronic kidney disease: Secondary | ICD-10-CM | POA: Diagnosis not present

## 2021-12-23 DIAGNOSIS — I129 Hypertensive chronic kidney disease with stage 1 through stage 4 chronic kidney disease, or unspecified chronic kidney disease: Secondary | ICD-10-CM | POA: Diagnosis not present

## 2021-12-23 DIAGNOSIS — R894 Abnormal immunological findings in specimens from other organs, systems and tissues: Secondary | ICD-10-CM | POA: Diagnosis not present

## 2021-12-31 DIAGNOSIS — K219 Gastro-esophageal reflux disease without esophagitis: Secondary | ICD-10-CM | POA: Diagnosis not present

## 2022-01-27 DIAGNOSIS — I10 Essential (primary) hypertension: Secondary | ICD-10-CM | POA: Diagnosis not present

## 2022-01-27 DIAGNOSIS — D631 Anemia in chronic kidney disease: Secondary | ICD-10-CM | POA: Diagnosis not present

## 2022-01-27 DIAGNOSIS — E1122 Type 2 diabetes mellitus with diabetic chronic kidney disease: Secondary | ICD-10-CM | POA: Diagnosis not present

## 2022-01-27 DIAGNOSIS — N189 Chronic kidney disease, unspecified: Secondary | ICD-10-CM | POA: Diagnosis not present

## 2022-01-27 DIAGNOSIS — Z23 Encounter for immunization: Secondary | ICD-10-CM | POA: Diagnosis not present

## 2022-01-27 DIAGNOSIS — J449 Chronic obstructive pulmonary disease, unspecified: Secondary | ICD-10-CM | POA: Diagnosis not present

## 2022-01-27 DIAGNOSIS — D696 Thrombocytopenia, unspecified: Secondary | ICD-10-CM | POA: Diagnosis not present

## 2022-01-27 DIAGNOSIS — L930 Discoid lupus erythematosus: Secondary | ICD-10-CM | POA: Diagnosis not present

## 2022-01-27 DIAGNOSIS — R600 Localized edema: Secondary | ICD-10-CM | POA: Diagnosis not present

## 2022-01-27 DIAGNOSIS — Z1211 Encounter for screening for malignant neoplasm of colon: Secondary | ICD-10-CM | POA: Diagnosis not present

## 2022-01-27 DIAGNOSIS — M170 Bilateral primary osteoarthritis of knee: Secondary | ICD-10-CM | POA: Diagnosis not present

## 2022-01-27 DIAGNOSIS — E785 Hyperlipidemia, unspecified: Secondary | ICD-10-CM | POA: Diagnosis not present

## 2022-01-31 DIAGNOSIS — K219 Gastro-esophageal reflux disease without esophagitis: Secondary | ICD-10-CM | POA: Diagnosis not present

## 2022-02-18 ENCOUNTER — Encounter: Payer: Self-pay | Admitting: *Deleted

## 2022-02-24 DIAGNOSIS — E1122 Type 2 diabetes mellitus with diabetic chronic kidney disease: Secondary | ICD-10-CM | POA: Diagnosis not present

## 2022-02-24 DIAGNOSIS — R894 Abnormal immunological findings in specimens from other organs, systems and tissues: Secondary | ICD-10-CM | POA: Diagnosis not present

## 2022-02-24 DIAGNOSIS — E211 Secondary hyperparathyroidism, not elsewhere classified: Secondary | ICD-10-CM | POA: Diagnosis not present

## 2022-02-24 DIAGNOSIS — I129 Hypertensive chronic kidney disease with stage 1 through stage 4 chronic kidney disease, or unspecified chronic kidney disease: Secondary | ICD-10-CM | POA: Diagnosis not present

## 2022-02-24 DIAGNOSIS — N189 Chronic kidney disease, unspecified: Secondary | ICD-10-CM | POA: Diagnosis not present

## 2022-02-27 DIAGNOSIS — D696 Thrombocytopenia, unspecified: Secondary | ICD-10-CM | POA: Diagnosis not present

## 2022-02-27 DIAGNOSIS — N189 Chronic kidney disease, unspecified: Secondary | ICD-10-CM | POA: Diagnosis not present

## 2022-02-27 DIAGNOSIS — L930 Discoid lupus erythematosus: Secondary | ICD-10-CM | POA: Diagnosis not present

## 2022-02-27 DIAGNOSIS — I129 Hypertensive chronic kidney disease with stage 1 through stage 4 chronic kidney disease, or unspecified chronic kidney disease: Secondary | ICD-10-CM | POA: Diagnosis not present

## 2022-02-27 DIAGNOSIS — D638 Anemia in other chronic diseases classified elsewhere: Secondary | ICD-10-CM | POA: Diagnosis not present

## 2022-02-27 DIAGNOSIS — E211 Secondary hyperparathyroidism, not elsewhere classified: Secondary | ICD-10-CM | POA: Diagnosis not present

## 2022-02-27 DIAGNOSIS — E1122 Type 2 diabetes mellitus with diabetic chronic kidney disease: Secondary | ICD-10-CM | POA: Diagnosis not present

## 2022-03-02 DIAGNOSIS — E1122 Type 2 diabetes mellitus with diabetic chronic kidney disease: Secondary | ICD-10-CM | POA: Diagnosis not present

## 2022-03-02 DIAGNOSIS — E785 Hyperlipidemia, unspecified: Secondary | ICD-10-CM | POA: Diagnosis not present

## 2022-03-02 DIAGNOSIS — I129 Hypertensive chronic kidney disease with stage 1 through stage 4 chronic kidney disease, or unspecified chronic kidney disease: Secondary | ICD-10-CM | POA: Diagnosis not present

## 2022-03-02 DIAGNOSIS — N189 Chronic kidney disease, unspecified: Secondary | ICD-10-CM | POA: Diagnosis not present

## 2022-03-10 DIAGNOSIS — L930 Discoid lupus erythematosus: Secondary | ICD-10-CM | POA: Diagnosis not present

## 2022-03-10 DIAGNOSIS — L669 Cicatricial alopecia, unspecified: Secondary | ICD-10-CM | POA: Diagnosis not present

## 2022-03-10 DIAGNOSIS — Z79899 Other long term (current) drug therapy: Secondary | ICD-10-CM | POA: Diagnosis not present

## 2022-04-02 ENCOUNTER — Encounter: Payer: Self-pay | Admitting: *Deleted

## 2022-04-02 DIAGNOSIS — E785 Hyperlipidemia, unspecified: Secondary | ICD-10-CM | POA: Diagnosis not present

## 2022-04-02 DIAGNOSIS — I129 Hypertensive chronic kidney disease with stage 1 through stage 4 chronic kidney disease, or unspecified chronic kidney disease: Secondary | ICD-10-CM | POA: Diagnosis not present

## 2022-04-02 DIAGNOSIS — N189 Chronic kidney disease, unspecified: Secondary | ICD-10-CM | POA: Diagnosis not present

## 2022-04-02 DIAGNOSIS — E1122 Type 2 diabetes mellitus with diabetic chronic kidney disease: Secondary | ICD-10-CM | POA: Diagnosis not present

## 2022-04-11 DIAGNOSIS — Z0001 Encounter for general adult medical examination with abnormal findings: Secondary | ICD-10-CM | POA: Diagnosis not present

## 2022-05-02 DIAGNOSIS — I129 Hypertensive chronic kidney disease with stage 1 through stage 4 chronic kidney disease, or unspecified chronic kidney disease: Secondary | ICD-10-CM | POA: Diagnosis not present

## 2022-05-02 DIAGNOSIS — E785 Hyperlipidemia, unspecified: Secondary | ICD-10-CM | POA: Diagnosis not present

## 2022-05-02 DIAGNOSIS — E1122 Type 2 diabetes mellitus with diabetic chronic kidney disease: Secondary | ICD-10-CM | POA: Diagnosis not present

## 2022-05-02 DIAGNOSIS — N189 Chronic kidney disease, unspecified: Secondary | ICD-10-CM | POA: Diagnosis not present

## 2022-05-12 DIAGNOSIS — I129 Hypertensive chronic kidney disease with stage 1 through stage 4 chronic kidney disease, or unspecified chronic kidney disease: Secondary | ICD-10-CM | POA: Diagnosis not present

## 2022-05-12 DIAGNOSIS — D696 Thrombocytopenia, unspecified: Secondary | ICD-10-CM | POA: Diagnosis not present

## 2022-05-12 DIAGNOSIS — E211 Secondary hyperparathyroidism, not elsewhere classified: Secondary | ICD-10-CM | POA: Diagnosis not present

## 2022-05-12 DIAGNOSIS — E1122 Type 2 diabetes mellitus with diabetic chronic kidney disease: Secondary | ICD-10-CM | POA: Diagnosis not present

## 2022-05-12 DIAGNOSIS — N189 Chronic kidney disease, unspecified: Secondary | ICD-10-CM | POA: Diagnosis not present

## 2022-05-14 DIAGNOSIS — N189 Chronic kidney disease, unspecified: Secondary | ICD-10-CM | POA: Diagnosis not present

## 2022-05-14 DIAGNOSIS — E875 Hyperkalemia: Secondary | ICD-10-CM | POA: Diagnosis not present

## 2022-05-14 DIAGNOSIS — I129 Hypertensive chronic kidney disease with stage 1 through stage 4 chronic kidney disease, or unspecified chronic kidney disease: Secondary | ICD-10-CM | POA: Diagnosis not present

## 2022-05-14 DIAGNOSIS — E1122 Type 2 diabetes mellitus with diabetic chronic kidney disease: Secondary | ICD-10-CM | POA: Diagnosis not present

## 2022-05-14 DIAGNOSIS — E211 Secondary hyperparathyroidism, not elsewhere classified: Secondary | ICD-10-CM | POA: Diagnosis not present

## 2022-05-14 DIAGNOSIS — D638 Anemia in other chronic diseases classified elsewhere: Secondary | ICD-10-CM | POA: Diagnosis not present

## 2022-06-02 DIAGNOSIS — E785 Hyperlipidemia, unspecified: Secondary | ICD-10-CM | POA: Diagnosis not present

## 2022-06-02 DIAGNOSIS — N189 Chronic kidney disease, unspecified: Secondary | ICD-10-CM | POA: Diagnosis not present

## 2022-06-02 DIAGNOSIS — I129 Hypertensive chronic kidney disease with stage 1 through stage 4 chronic kidney disease, or unspecified chronic kidney disease: Secondary | ICD-10-CM | POA: Diagnosis not present

## 2022-06-02 DIAGNOSIS — E1122 Type 2 diabetes mellitus with diabetic chronic kidney disease: Secondary | ICD-10-CM | POA: Diagnosis not present

## 2022-07-12 ENCOUNTER — Emergency Department (HOSPITAL_COMMUNITY)
Admission: EM | Admit: 2022-07-12 | Discharge: 2022-07-12 | Disposition: A | Discharge status: Home / Self Care | Payer: Medicare Other | Attending: Emergency Medicine | Admitting: Emergency Medicine

## 2022-07-12 ENCOUNTER — Other Ambulatory Visit: Payer: Self-pay

## 2022-07-12 ENCOUNTER — Encounter (HOSPITAL_COMMUNITY): Payer: Self-pay

## 2022-07-12 ENCOUNTER — Emergency Department (HOSPITAL_COMMUNITY): Payer: Medicare Other

## 2022-07-12 DIAGNOSIS — M7989 Other specified soft tissue disorders: Secondary | ICD-10-CM | POA: Diagnosis not present

## 2022-07-12 DIAGNOSIS — R300 Dysuria: Secondary | ICD-10-CM | POA: Diagnosis not present

## 2022-07-12 DIAGNOSIS — I509 Heart failure, unspecified: Secondary | ICD-10-CM | POA: Insufficient documentation

## 2022-07-12 DIAGNOSIS — R109 Unspecified abdominal pain: Secondary | ICD-10-CM | POA: Diagnosis not present

## 2022-07-12 DIAGNOSIS — R110 Nausea: Secondary | ICD-10-CM | POA: Diagnosis not present

## 2022-07-12 DIAGNOSIS — K5900 Constipation, unspecified: Secondary | ICD-10-CM | POA: Diagnosis not present

## 2022-07-12 LAB — URINALYSIS, ROUTINE W REFLEX MICROSCOPIC
Bilirubin Urine: NEGATIVE
Glucose, UA: NEGATIVE mg/dL
Hgb urine dipstick: NEGATIVE
Ketones, ur: NEGATIVE mg/dL
Leukocytes,Ua: NEGATIVE
Nitrite: NEGATIVE
Protein, ur: NEGATIVE mg/dL
Specific Gravity, Urine: 1.005 (ref 1.005–1.030)
pH: 5 (ref 5.0–8.0)

## 2022-07-12 LAB — CBC
HCT: 33.3 % — ABNORMAL LOW (ref 36.0–46.0)
Hemoglobin: 10.8 g/dL — ABNORMAL LOW (ref 12.0–15.0)
MCH: 31.2 pg (ref 26.0–34.0)
MCHC: 32.4 g/dL (ref 30.0–36.0)
MCV: 96.2 fL (ref 80.0–100.0)
Platelets: 154 10*3/uL (ref 150–400)
RBC: 3.46 MIL/uL — ABNORMAL LOW (ref 3.87–5.11)
RDW: 13.2 % (ref 11.5–15.5)
WBC: 5.3 10*3/uL (ref 4.0–10.5)
nRBC: 0 % (ref 0.0–0.2)

## 2022-07-12 LAB — COMPREHENSIVE METABOLIC PANEL
ALT: 16 U/L (ref 0–44)
AST: 17 U/L (ref 15–41)
Albumin: 3.8 g/dL (ref 3.5–5.0)
Alkaline Phosphatase: 64 U/L (ref 38–126)
Anion gap: 6 (ref 5–15)
BUN: 35 mg/dL — ABNORMAL HIGH (ref 8–23)
CO2: 25 mmol/L (ref 22–32)
Calcium: 8.8 mg/dL — ABNORMAL LOW (ref 8.9–10.3)
Chloride: 108 mmol/L (ref 98–111)
Creatinine, Ser: 2.35 mg/dL — ABNORMAL HIGH (ref 0.44–1.00)
GFR, Estimated: 21 mL/min — ABNORMAL LOW (ref >60)
Glucose, Bld: 88 mg/dL (ref 70–99)
Potassium: 4.6 mmol/L (ref 3.5–5.1)
Sodium: 139 mmol/L (ref 135–145)
Total Bilirubin: 0.7 mg/dL (ref 0.3–1.2)
Total Protein: 7.3 g/dL (ref 6.5–8.1)

## 2022-07-12 LAB — LIPASE, BLOOD: Lipase: 31 U/L (ref 11–51)

## 2022-07-12 MED ORDER — DOCUSATE SODIUM 100 MG PO CAPS: 100.0000 mg | ORAL_CAPSULE | Freq: Every day | ORAL | 0 refills | Status: AC | PRN

## 2022-07-12 NOTE — ED Notes (Addendum)
Patient transported to X-ray 

## 2022-07-12 NOTE — ED Triage Notes (Signed)
Pt c/o abdominal pain, constipation, nausea, and dysuria x2 weeks.  Currently, denies pain.  Pt was by PCP and referred to ED.  Pt sts "it comes, but not like I regularly do."

## 2022-07-12 NOTE — ED Provider Notes (Signed)
[facility] EMERGENCY DEPARTMENT Provider Note   CSN: [id number] Arrival date & time: 07/12/22  1233     History {Add pertinent medical, surgical, social history, OB history to HPI:1} Chief Complaint  Patient presents with   Abdominal Pain   Constipation    Allison Gould is a 71 y.o. female.  Patient complains of constipation and no abdominal pain.  Patient has a history of GERD and obesity   Abdominal Pain Associated symptoms: constipation   Constipation Associated symptoms: abdominal pain        Home Medications Prior to Admission medications   Medication Sig Start Date End Date Taking? Authorizing Provider  docusate sodium (COLACE) 100 MG capsule Take 1 capsule (100 mg total) by mouth daily as needed for mild constipation or moderate constipation. 07/12/22  Yes Milton Ferguson, MD  acetaminophen (TYLENOL) 500 MG tablet Take 1,000 mg by mouth every 6 (six) hours as needed for mild pain.    [provider]  albuterol (VENTOLIN HFA) 108 (90 Base) MCG/ACT inhaler Inhale 2 puffs into the lungs every 6 (six) hours as needed. 04/01/21   [provider]  amLODipine (NORVASC) 10 MG tablet Take 10 mg by mouth daily. 06/05/12   [provider]  betamethasone dipropionate (DIPROLENE) 0.05 % cream Apply topically daily.    [provider]  Carboxymethylcellul-Glycerin 0.5-0.9 % SOLN Place 1 drop into both eyes daily as needed. Dry Eyes    [provider]  cholecalciferol (VITAMIN D) 1000 UNITS tablet Take 1,000 Units by mouth daily.    [provider]  furosemide (LASIX) 40 MG tablet Take 1 tablet (40 mg total) by mouth daily. 04/15/21   Fay Records, MD  hydroxychloroquine (PLAQUENIL) 200 MG tablet Take 200 mg by mouth 2 (two) times daily.    [provider]  lisinopril (PRINIVIL,ZESTRIL) 40 MG tablet Take 40 mg by mouth daily. 06/05/12   [provider]  nitroGLYCERIN (NITROSTAT) 0.4 MG SL tablet Place 0.4 mg under the  tongue every 5 (five) minutes as needed. Chest Pain Patient not taking: Reported on 04/08/2021    [provider]  omeprazole (PRILOSEC) 20 MG capsule Take 20 mg by mouth daily. 06/05/12   [provider]  polyethylene glycol powder (GLYCOLAX/MIRALAX) powder MIX 1 CAPFUL (17 GRAMS) WITH 8OZ OF WATER OR JUICE DAILY. 05/01/16   Carlis Stable, NP  pravastatin (PRAVACHOL) 10 MG tablet Take 10 mg by mouth at bedtime. 04/01/21   [provider]  pregabalin (LYRICA) 50 MG capsule TAKE ONE TABLET BY MOUTH EVERYDAY AT BEDTIME FOR nerve pain 09/05/20   [provider]  spironolactone (ALDACTONE) 25 MG tablet Take 25 mg by mouth 2 (two) times daily. 05/07/12   [provider]      Allergies    Patient has no known allergies.    Review of Systems   Review of Systems  Gastrointestinal:  Positive for abdominal pain and constipation.    Physical Exam Updated Vital Signs BP (!) 124/52 (BP Location: Right Arm)   Pulse (!) 55   Temp 97.6 F (36.4 C) (Oral)   Resp 14   Ht '5\' 5"'$  (1.651 m)   Wt 133.8 kg   SpO2 100%   BMI 49.09 kg/m  Physical Exam  ED Results / Procedures / Treatments   Labs (all labs ordered are listed, but only abnormal results are displayed) Labs Reviewed  COMPREHENSIVE METABOLIC PANEL - Abnormal; Notable for the following components:      Result  Value   BUN 35 (*)    Creatinine, Ser 2.35 (*)    Calcium 8.8 (*)    GFR, Estimated 21 (*)    All other components within normal limits  CBC - Abnormal; Notable for the following components:   RBC 3.46 (*)    Hemoglobin 10.8 (*)    HCT 33.3 (*)    All other components within normal limits  URINALYSIS, ROUTINE W REFLEX MICROSCOPIC - Abnormal; Notable for the following components:   Color, Urine COLORLESS (*)    All other components within normal limits  LIPASE, BLOOD    EKG None  Radiology DG ABD ACUTE 2+V W 1V CHEST  Result Date: 07/12/2022 CLINICAL DATA:  Abdominal pain, constipation  common nausea and dysuria x2 weeks. EXAM: DG ABDOMEN ACUTE WITH 1 VIEW CHEST COMPARISON:  Chest radiograph February 24, 2021 and abdominal radiograph November 18, 2004. FINDINGS: There is no evidence of dilated bowel loops or free intraperitoneal air. Small volume of formed stool in the proximal colon. Calcified uterine fibroid. Heart size and mediastinal contours are within normal limits. Both lungs are clear. Surgical anchors in the right humeral head. IMPRESSION: No evidence of bowel obstruction. Small volume of formed stool in the proximal colon. No acute cardiopulmonary disease. Electronically Signed   By: Dahlia Bailiff M.D.   On: 07/12/2022 15:41    Procedures Procedures  {Document cardiac monitor, telemetry assessment procedure when appropriate:1}  Medications Ordered in ED Medications - No data to display  ED Course/ Medical Decision Making/ A&P                           Medical Decision Making Amount and/or Complexity of Data Reviewed Labs: ordered. Radiology: ordered.  Risk OTC drugs.   Patient with mild constipation she will be discharged with Colace and follow-up with her doctor  {Document critical care time when appropriate:1} {Document review of labs and clinical decision tools ie heart score, Chads2Vasc2 etc:1}  {Document your independent review of radiology images, and any outside records:1} {Document your discussion with family members, caretakers, and with consultants:1} {Document social determinants of health affecting pt's care:1} {Document your decision making why or why not admission, treatments were needed:1} Final Clinical Impression(s) / ED Diagnoses Final diagnoses:  Constipation, unspecified constipation type    Rx / DC Orders ED Discharge Orders          Ordered    docusate sodium (COLACE) 100 MG capsule  Daily PRN        07/12/22 1603

## 2022-07-12 NOTE — Discharge Instructions (Signed)
Follow-up with your family doctor first if any more problems with constipation

## 2022-07-14 ENCOUNTER — Other Ambulatory Visit (HOSPITAL_COMMUNITY): Payer: Self-pay | Admitting: Family Medicine

## 2022-07-14 DIAGNOSIS — I129 Hypertensive chronic kidney disease with stage 1 through stage 4 chronic kidney disease, or unspecified chronic kidney disease: Secondary | ICD-10-CM | POA: Diagnosis not present

## 2022-07-14 DIAGNOSIS — E211 Secondary hyperparathyroidism, not elsewhere classified: Secondary | ICD-10-CM | POA: Diagnosis not present

## 2022-07-14 DIAGNOSIS — K5900 Constipation, unspecified: Secondary | ICD-10-CM

## 2022-07-14 DIAGNOSIS — E1122 Type 2 diabetes mellitus with diabetic chronic kidney disease: Secondary | ICD-10-CM | POA: Diagnosis not present

## 2022-07-14 DIAGNOSIS — N189 Chronic kidney disease, unspecified: Secondary | ICD-10-CM | POA: Diagnosis not present

## 2022-07-14 DIAGNOSIS — E875 Hyperkalemia: Secondary | ICD-10-CM | POA: Diagnosis not present

## 2022-07-23 DIAGNOSIS — R7301 Impaired fasting glucose: Secondary | ICD-10-CM | POA: Diagnosis not present

## 2022-07-23 DIAGNOSIS — D696 Thrombocytopenia, unspecified: Secondary | ICD-10-CM | POA: Diagnosis not present

## 2022-07-23 DIAGNOSIS — I10 Essential (primary) hypertension: Secondary | ICD-10-CM | POA: Diagnosis not present

## 2022-07-23 DIAGNOSIS — D631 Anemia in chronic kidney disease: Secondary | ICD-10-CM | POA: Diagnosis not present

## 2022-07-23 DIAGNOSIS — E785 Hyperlipidemia, unspecified: Secondary | ICD-10-CM | POA: Diagnosis not present

## 2022-07-23 DIAGNOSIS — R300 Dysuria: Secondary | ICD-10-CM | POA: Diagnosis not present

## 2022-07-23 DIAGNOSIS — N189 Chronic kidney disease, unspecified: Secondary | ICD-10-CM | POA: Diagnosis not present

## 2022-07-23 DIAGNOSIS — R202 Paresthesia of skin: Secondary | ICD-10-CM | POA: Insufficient documentation

## 2022-07-23 DIAGNOSIS — Z23 Encounter for immunization: Secondary | ICD-10-CM | POA: Diagnosis not present

## 2022-07-23 DIAGNOSIS — R600 Localized edema: Secondary | ICD-10-CM | POA: Diagnosis not present

## 2022-07-23 DIAGNOSIS — K5900 Constipation, unspecified: Secondary | ICD-10-CM | POA: Diagnosis not present

## 2022-07-23 DIAGNOSIS — M170 Bilateral primary osteoarthritis of knee: Secondary | ICD-10-CM | POA: Diagnosis not present

## 2022-07-23 DIAGNOSIS — L930 Discoid lupus erythematosus: Secondary | ICD-10-CM | POA: Diagnosis not present

## 2022-07-24 DIAGNOSIS — E1122 Type 2 diabetes mellitus with diabetic chronic kidney disease: Secondary | ICD-10-CM | POA: Diagnosis not present

## 2022-07-24 DIAGNOSIS — E211 Secondary hyperparathyroidism, not elsewhere classified: Secondary | ICD-10-CM | POA: Diagnosis not present

## 2022-07-24 DIAGNOSIS — N189 Chronic kidney disease, unspecified: Secondary | ICD-10-CM | POA: Diagnosis not present

## 2022-07-24 DIAGNOSIS — E875 Hyperkalemia: Secondary | ICD-10-CM | POA: Diagnosis not present

## 2022-07-24 DIAGNOSIS — I129 Hypertensive chronic kidney disease with stage 1 through stage 4 chronic kidney disease, or unspecified chronic kidney disease: Secondary | ICD-10-CM | POA: Diagnosis not present

## 2022-07-24 DIAGNOSIS — D638 Anemia in other chronic diseases classified elsewhere: Secondary | ICD-10-CM | POA: Diagnosis not present

## 2022-08-02 DIAGNOSIS — N189 Chronic kidney disease, unspecified: Secondary | ICD-10-CM | POA: Diagnosis not present

## 2022-08-02 DIAGNOSIS — E785 Hyperlipidemia, unspecified: Secondary | ICD-10-CM | POA: Diagnosis not present

## 2022-08-02 DIAGNOSIS — I129 Hypertensive chronic kidney disease with stage 1 through stage 4 chronic kidney disease, or unspecified chronic kidney disease: Secondary | ICD-10-CM | POA: Diagnosis not present

## 2022-08-02 DIAGNOSIS — E1122 Type 2 diabetes mellitus with diabetic chronic kidney disease: Secondary | ICD-10-CM | POA: Diagnosis not present

## 2022-09-08 ENCOUNTER — Encounter: Payer: Self-pay | Admitting: *Deleted

## 2022-09-12 DIAGNOSIS — I10 Essential (primary) hypertension: Secondary | ICD-10-CM | POA: Diagnosis not present

## 2022-09-12 DIAGNOSIS — E1122 Type 2 diabetes mellitus with diabetic chronic kidney disease: Secondary | ICD-10-CM | POA: Diagnosis not present

## 2022-09-17 DIAGNOSIS — E875 Hyperkalemia: Secondary | ICD-10-CM | POA: Insufficient documentation

## 2022-09-17 DIAGNOSIS — E870 Hyperosmolality and hypernatremia: Secondary | ICD-10-CM | POA: Insufficient documentation

## 2022-09-18 DIAGNOSIS — J449 Chronic obstructive pulmonary disease, unspecified: Secondary | ICD-10-CM | POA: Diagnosis not present

## 2022-09-18 DIAGNOSIS — E785 Hyperlipidemia, unspecified: Secondary | ICD-10-CM | POA: Diagnosis not present

## 2022-09-18 DIAGNOSIS — D696 Thrombocytopenia, unspecified: Secondary | ICD-10-CM | POA: Diagnosis not present

## 2022-09-18 DIAGNOSIS — I10 Essential (primary) hypertension: Secondary | ICD-10-CM | POA: Diagnosis not present

## 2022-09-18 DIAGNOSIS — E870 Hyperosmolality and hypernatremia: Secondary | ICD-10-CM | POA: Diagnosis not present

## 2022-09-18 DIAGNOSIS — R0683 Snoring: Secondary | ICD-10-CM | POA: Diagnosis not present

## 2022-09-18 DIAGNOSIS — D631 Anemia in chronic kidney disease: Secondary | ICD-10-CM | POA: Diagnosis not present

## 2022-09-18 DIAGNOSIS — N189 Chronic kidney disease, unspecified: Secondary | ICD-10-CM | POA: Diagnosis not present

## 2022-09-18 DIAGNOSIS — L930 Discoid lupus erythematosus: Secondary | ICD-10-CM | POA: Diagnosis not present

## 2022-09-18 DIAGNOSIS — R600 Localized edema: Secondary | ICD-10-CM | POA: Diagnosis not present

## 2022-09-18 DIAGNOSIS — M170 Bilateral primary osteoarthritis of knee: Secondary | ICD-10-CM | POA: Diagnosis not present

## 2022-09-24 ENCOUNTER — Ambulatory Visit (HOSPITAL_COMMUNITY)
Admission: RE | Admit: 2022-09-24 | Discharge: 2022-09-24 | Disposition: A | Discharge status: Home / Self Care | Payer: Medicare Other | Source: Ambulatory Visit | Attending: Family Medicine | Admitting: Family Medicine

## 2022-09-24 DIAGNOSIS — Z1231 Encounter for screening mammogram for malignant neoplasm of breast: Secondary | ICD-10-CM | POA: Diagnosis not present

## 2022-11-05 ENCOUNTER — Encounter: Payer: Self-pay | Admitting: Orthopedic Surgery

## 2022-11-05 ENCOUNTER — Ambulatory Visit (INDEPENDENT_AMBULATORY_CARE_PROVIDER_SITE_OTHER): Payer: Medicare Other | Admitting: Orthopedic Surgery

## 2022-11-05 ENCOUNTER — Ambulatory Visit (INDEPENDENT_AMBULATORY_CARE_PROVIDER_SITE_OTHER): Payer: Medicare Other

## 2022-11-05 VITALS — BP 155/57 | HR 87 | Ht 65.0 in | Wt 294.0 lb

## 2022-11-05 DIAGNOSIS — M7552 Bursitis of left shoulder: Secondary | ICD-10-CM

## 2022-11-05 DIAGNOSIS — M25512 Pain in left shoulder: Secondary | ICD-10-CM | POA: Diagnosis not present

## 2022-11-05 MED ORDER — METHYLPREDNISOLONE ACETATE 40 MG/ML IJ SUSP
40.0000 mg | Freq: Once | INTRAMUSCULAR | Status: AC
  Administered 2022-11-05: 40 mg via INTRA_ARTICULAR

## 2022-11-05 NOTE — Addendum Note (Signed)
Addended byCandice Camp on: 11/05/2022 10:34 AM   Modules accepted: Orders

## 2022-11-05 NOTE — Patient Instructions (Addendum)
You have bursitis of the shoulder it will get better on its own  Continue the liniment and the Tylenol  Do the exercises noted on this sheet  Procedure note the subacromial injection shoulder left   Verbal consent was obtained to inject the  Left   Shoulder  Timeout was completed to confirm the injection site is a subacromial space of the  left  shoulder  Medication used Depo-Medrol 40 mg and lidocaine 1% 3 cc  Anesthesia was provided by ethyl chloride  The injection was performed in the left  posterior subacromial space. After pinning the skin with alcohol and anesthetized the skin with ethyl chloride the subacromial space was injected using a 20-gauge needle. There were no complications  Sterile dressing was applied.

## 2022-11-05 NOTE — Progress Notes (Signed)
Patient ID: Allison Gould, female   DOB: [birth year], 72 y.o.   MRN: [id number]  SUMMARY AND PLAN:  Acute bursitis left shoulder recommend continue Tylenol and liniment..  She also wanted to try cortisone injection  Start gentle range of motion exercises  Follow-up as needed should resolve on its own    Chief Complaint  Patient presents with   Shoulder Pain    Left since Sunday at church has pain with motion       HPI Allison Gould is a 71 y.o. female.  Presents with pain in her left shoulder since Christmas Day.  She has PERI acromial left shoulder pain with no history of trauma.  She says the pain just started and she could not move her arm however she did take some Tylenol and used some liniment and now her range of motion improved although her shoulder is still sore  Treatment:  Nsaids no  PT no  Injection no  Current Outpatient Medications  Medication Instructions   acetaminophen (TYLENOL) 1,000 mg, Oral, Every 6 hours PRN   albuterol (VENTOLIN HFA) 108 (90 Base) MCG/ACT inhaler 2 puffs, Inhalation, Every 6 hours PRN   amLODipine (NORVASC) 10 mg, Oral, Daily   betamethasone dipropionate (DIPROLENE) 0.05 % cream Topical, Daily   calcitRIOL (ROCALTROL) 0.25 mcg, Oral, Daily   Carboxymethylcellul-Glycerin 0.5-0.9 % SOLN 1 drop, Both Eyes, Daily PRN, Dry Eyes    cholecalciferol (VITAMIN D) 1,000 Units, Daily   docusate sodium (COLACE) 100 mg, Oral, Daily PRN   Fluticasone-Umeclidin-Vilant (TRELEGY ELLIPTA IN) Inhalation   furosemide (LASIX) 40 mg, Oral, Daily   hydroxychloroquine (PLAQUENIL) 200 mg, Oral, 2 times daily   lisinopril (ZESTRIL) 40 mg, Oral, Daily   nitroGLYCERIN (NITROSTAT) 0.4 mg, Sublingual, Every 5 min PRN, Chest Pain    omeprazole (PRILOSEC) 20 mg, Oral, Daily   polyethylene glycol powder (GLYCOLAX/MIRALAX) powder MIX 1 CAPFUL (17 GRAMS) WITH 8OZ OF WATER OR JUICE DAILY.   pravastatin (PRAVACHOL) 10 mg, Oral, Daily at bedtime   pregabalin (LYRICA) 50 MG  capsule TAKE ONE TABLET BY MOUTH EVERYDAY AT BEDTIME FOR nerve pain   spironolactone (ALDACTONE) 25 mg, Oral, 2 times daily   traMADol (ULTRAM) 50 MG tablet Oral, Every 6 hours PRN    No Known Allergies  Review of Systems Review of Systems  HENT:  Positive for tinnitus.   Respiratory:  Positive for wheezing.   Cardiovascular:  Positive for claudication and leg swelling.  Neurological:  Positive for headaches.  All other systems reviewed and are negative.   Past Medical History:  Diagnosis Date   Anxiety    Diabetes mellitus type II    GERD (gastroesophageal reflux disease)    Hearing aid worn    right   Hypertension    Lipid disorder    Loss of hair    for unknown reason, to see MD    Low back pain    Morbid obesity with BMI of 45.0-49.9, adult [facility])    Osteoarthritis     Past Surgical History:  Procedure Laterality Date   CATARACT EXTRACTION W/PHACO Left 05/12/2016   Procedure: CATARACT EXTRACTION PHACO AND INTRAOCULAR LENS PLACEMENT ([place]);  Surgeon: Tonny Branch, MD;  Location: [facility];  Service: Ophthalmology;  Laterality: Left;  CDE: 6.63   CATARACT EXTRACTION W/PHACO Right 06/16/2016   Procedure: CATARACT EXTRACTION PHACO AND INTRAOCULAR LENS PLACEMENT ([facility]); CDE:  4.13;  Surgeon: Tonny Branch, MD;  Location: [facility];  Service: Ophthalmology;  Laterality: Right;   COLONOSCOPY  07/12/2012   Procedure: COLONOSCOPY;  Surgeon: Danie Binder, MD;  Location: [facility] END[facility] SU[facility];  Service: Endoscopy;  Laterality: N/A;  1:00   lipoma-right shoulder     spur and nerve repair right shoulder      Family History  Problem Relation Age of Onset   Lupus Sister    Colon cancer Neg Hx    Liver disease Neg Hx    Inflammatory bowel disease Neg Hx      Social History   Tobacco Use   Smoking status: Former    Packs/day: 0.50    Types: Cigarettes   Smokeless tobacco: Never   Tobacco comments:    quit years ago  Substance Use Topics   Alcohol use: No   Drug use: No    No Known  Allergies   Current Meds  Medication Sig   acetaminophen (TYLENOL) 500 MG tablet Take 1,000 mg by mouth every 6 (six) hours as needed for mild pain.   albuterol (VENTOLIN HFA) 108 (90 Base) MCG/ACT inhaler Inhale 2 puffs into the lungs every 6 (six) hours as needed.   amLODipine (NORVASC) 10 MG tablet Take 10 mg by mouth daily.   betamethasone dipropionate (DIPROLENE) 0.05 % cream Apply topically daily.   calcitRIOL (ROCALTROL) 0.25 MCG capsule Take 0.25 mcg by mouth daily.   Carboxymethylcellul-Glycerin 0.5-0.9 % SOLN Place 1 drop into both eyes daily as needed. Dry Eyes   cholecalciferol (VITAMIN D) 1000 UNITS tablet Take 1,000 Units by mouth daily.   docusate sodium (COLACE) 100 MG capsule Take 1 capsule (100 mg total) by mouth daily as needed for mild constipation or moderate constipation.   Fluticasone-Umeclidin-Vilant (TRELEGY ELLIPTA IN) Inhale into the lungs.   furosemide (LASIX) 40 MG tablet Take 1 tablet (40 mg total) by mouth daily.   lisinopril (PRINIVIL,ZESTRIL) 40 MG tablet Take 40 mg by mouth daily.   nitroGLYCERIN (NITROSTAT) 0.4 MG SL tablet Place 0.4 mg under the tongue every 5 (five) minutes as needed. Chest Pain   omeprazole (PRILOSEC) 20 MG capsule Take 20 mg by mouth daily.   polyethylene glycol powder (GLYCOLAX/MIRALAX) powder MIX 1 CAPFUL (17 GRAMS) WITH 8OZ OF WATER OR JUICE DAILY.   pravastatin (PRAVACHOL) 10 MG tablet Take 10 mg by mouth at bedtime.   pregabalin (LYRICA) 50 MG capsule TAKE ONE TABLET BY MOUTH EVERYDAY AT BEDTIME FOR nerve pain   spironolactone (ALDACTONE) 25 MG tablet Take 25 mg by mouth 2 (two) times daily.   traMADol (ULTRAM) 50 MG tablet Take by mouth every 6 (six) hours as needed.       Physical Exam BP (!) 155/57   Pulse 87   Ht '5\' 5"'$  (1.651 m)   Wt 294 lb (133.4 kg)   BMI 48.92 kg/m   The patient meets the AMA guidelines for Morbid (severe) obesity with a BMI > 40.0 and I have recommended weight loss.  Ambulatory status normal  with no assistive devices  GENERAL : APPEARANCE IS NORMAL GROOMING IS GOOD  NORMAL MOOD AND AFFECT  AWAKE ALERT AND ORIENTED X 3   Left SHOULDER  TENDERNESS yes, posterior joint line and anterior joint line rotator interval lateral deltoid ROM active abduction 90 flexion 120, feels pain but not inhibitive STABLE ANTERIORLY POSTERIORLY AND INFERIORLY SKIN CLEAN     PROVOCATIVE TESTS  DROP ARM TEST PAINFUL ARC 45-120 flexion  MEDICAL DECISION MAKING  A.  Encounter Diagnosis  Name Primary?   Acute pain of left shoulder Yes    B.  DATA ANALYSED:   IMAGING: Independent interpretation of images: Internal imaging normal glenohumeral joint she may have a little head migration  Orders: None  Outside records reviewed: NO  C. MANAGEMENT recommend Exercises, liniment, Tylenol  Procedure note the subacromial injection shoulder left   Verbal consent was obtained to inject the  Left   Shoulder  Timeout was completed to confirm the injection site is a subacromial space of the  left  shoulder  Medication used Depo-Medrol 40 mg and lidocaine 1% 3 cc  Anesthesia was provided by ethyl chloride  The injection was performed in the left  posterior subacromial space. After pinning the skin with alcohol and anesthetized the skin with ethyl chloride the subacromial space was injected using a 20-gauge needle. There were no complications  Sterile dressing was applied.  No orders of the defined types were placed in this encounter.

## 2022-11-13 ENCOUNTER — Ambulatory Visit (INDEPENDENT_AMBULATORY_CARE_PROVIDER_SITE_OTHER): Payer: 59

## 2022-11-13 ENCOUNTER — Encounter: Payer: Self-pay | Admitting: Orthopedic Surgery

## 2022-11-13 ENCOUNTER — Ambulatory Visit (INDEPENDENT_AMBULATORY_CARE_PROVIDER_SITE_OTHER): Payer: 59 | Admitting: Orthopedic Surgery

## 2022-11-13 VITALS — BP 155/57 | Ht 65.0 in | Wt 294.0 lb

## 2022-11-13 DIAGNOSIS — M5410 Radiculopathy, site unspecified: Secondary | ICD-10-CM

## 2022-11-13 DIAGNOSIS — Z6841 Body Mass Index (BMI) 40.0 and over, adult: Secondary | ICD-10-CM

## 2022-11-13 MED ORDER — IBUPROFEN 800 MG PO TABS: 800.0000 mg | ORAL_TABLET | Freq: Three times a day (TID) | ORAL | 1 refills | Status: AC | PRN

## 2022-11-13 MED ORDER — TIZANIDINE HCL 4 MG PO TABS: 4.0000 mg | ORAL_TABLET | Freq: Four times a day (QID) | ORAL | 0 refills | Status: DC | PRN

## 2022-11-13 NOTE — Progress Notes (Signed)
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      No Known Allergies  Current Outpatient Medications  Medication Instructions   acetaminophen (TYLENOL) 1,000 mg, Oral, Every 6 hours PRN   albuterol (VENTOLIN HFA) Allison (90 Base) MCG/ACT inhaler 2 puffs, Inhalation, Every 6 hours PRN   amLODipine (NORVASC) 10 mg, Oral, Daily   betamethasone dipropionate (DIPROLENE) 0.05 % cream Topical, Daily   calcitRIOL (ROCALTROL) 0.25 mcg, Oral, Daily   Carboxymethylcellul-Glycerin 0.5-0.9 % SOLN 1 drop, Both Eyes, Daily PRN, Dry Eyes    cholecalciferol (VITAMIN D) 1,000 Units, Daily   docusate sodium (COLACE) 100 mg, Oral, Daily PRN   Fluticasone-Umeclidin-Vilant (TRELEGY ELLIPTA IN) Inhalation   furosemide (LASIX) 40 mg, Oral, Daily   hydroxychloroquine (PLAQUENIL) 200 mg, Oral, 2 times daily   ibuprofen (ADVIL) 800 mg, Oral, Every 8 hours PRN   lisinopril (ZESTRIL) 40 mg, Oral, Daily   nitroGLYCERIN (NITROSTAT) 0.4 mg, Sublingual, Every 5 min PRN, Chest Pain    omeprazole (PRILOSEC) 20 mg, Oral, Daily   polyethylene glycol powder (GLYCOLAX/MIRALAX) powder MIX 1 CAPFUL (17 GRAMS) WITH 8OZ OF WATER OR JUICE DAILY.   pravastatin (PRAVACHOL) 10 mg, Oral, Daily at bedtime   pregabalin (LYRICA) 50 MG capsule TAKE ONE TABLET BY MOUTH EVERYDAY AT BEDTIME FOR nerve pain   spironolactone (ALDACTONE) 25 mg, Oral, 2 times daily   tiZANidine (ZANAFLEX) 4 mg, Oral, Every 6 hours PRN   traMADol (ULTRAM) 50 MG tablet Oral, Every 6 hours PRN    Review of Systems   Musculoskeletal:  Positive for back pain and joint pain.  Neurological:  Positive for tingling.     BP (!) 155/57 Comment: on 11/05/21  Ht '5\' 5"'$  (1.651 m)   Wt 294 lb (133.4 kg)   BMI 48.92 kg/m   Body mass index is 48.92 kg/m.  General appearance: Well-developed well-nourished no gross deformities  Cardiovascular normal pulse and perfusion normal color without edema  Neurologically no sensation loss or deficits or pathologic reflexes  Psychological: Awake alert and oriented x3 mood and affect normal  Skin no lacerations or ulcerations no nodularity no palpable masses, no erythema or nodularity  Musculoskeletal:  Patient is tender in her lower back and then on the left lateral leg.  Appears to have radicular type symptoms but no weakness in the foot   Past Medical History:  Diagnosis Date   Anxiety    Diabetes mellitus type II    GERD (gastroesophageal reflux disease)    Hearing aid worn    right   Hypertension    Lipid disorder    Loss of hair    for unknown reason, to see MD    Low back pain    Morbid obesity with BMI of 45.0-49.9, adult [facility])    Osteoarthritis     Past Surgical History:  Procedure Laterality Date   CATARACT EXTRACTION W/PHACO Left 05/12/2016   Procedure: CATARACT EXTRACTION PHACO AND INTRAOCULAR LENS PLACEMENT ([place]);  Surgeon: Tonny Branch, MD;  Location: [facility];  Service:  Ophthalmology;  Laterality: Left;  CDE: 6.63   CATARACT EXTRACTION W/PHACO Right 06/16/2016   Procedure: CATARACT EXTRACTION PHACO AND INTRAOCULAR LENS PLACEMENT ([facility]); CDE:  4.13;  Surgeon: Tonny Branch, MD;  Location: [facility];  Service: Ophthalmology;  Laterality: Right;   COLONOSCOPY  07/12/2012   Procedure: COLONOSCOPY;  Surgeon: Danie Binder, MD;  Location: [facility];  Service: Endoscopy;  Laterality: N/A;  1:00   lipoma-right shoulder     spur and nerve repair right shoulder      Family History  Problem Relation Age of Onset   Lupus Sister    Colon cancer Neg Hx     Liver disease Neg Hx    Inflammatory bowel disease Neg Hx    Social History   Tobacco Use   Smoking status: Former    Packs/day: 0.50    Types: Cigarettes   Smokeless tobacco: Never   Tobacco comments:    quit years ago  Substance Use Topics   Alcohol use: No   Drug use: No    No Known Allergies  Current Meds  Medication Sig   acetaminophen (TYLENOL) 500 MG tablet Take 1,000 mg by mouth every 6 (six) hours as needed for mild pain.   albuterol (VENTOLIN HFA) Allison (90 Base) MCG/ACT inhaler Inhale 2 puffs into the lungs every 6 (six) hours as needed.   amLODipine (NORVASC) 10 MG tablet Take 10 mg by mouth daily.   betamethasone dipropionate (DIPROLENE) 0.05 % cream Apply topically daily.   calcitRIOL (ROCALTROL) 0.25 MCG capsule Take 0.25 mcg by mouth daily.   Carboxymethylcellul-Glycerin 0.5-0.9 % SOLN Place 1 drop into both eyes daily as needed. Dry Eyes   cholecalciferol (VITAMIN D) 1000 UNITS tablet Take 1,000 Units by mouth daily.   docusate sodium (COLACE) 100 MG capsule Take 1 capsule (100 mg total) by mouth daily as needed for mild constipation or moderate constipation.   Fluticasone-Umeclidin-Vilant (TRELEGY ELLIPTA IN) Inhale into the lungs.   furosemide (LASIX) 40 MG tablet Take 1 tablet (40 mg total) by mouth daily.   hydroxychloroquine (PLAQUENIL) 200 MG tablet Take 200 mg by mouth 2 (two) times daily.   ibuprofen (ADVIL) 800 MG tablet Take 1 tablet (800 mg total) by mouth every 8 (eight) hours as needed.   lisinopril (PRINIVIL,ZESTRIL) 40 MG tablet Take 40 mg by mouth daily.   nitroGLYCERIN (NITROSTAT) 0.4 MG SL tablet Place 0.4 mg under the tongue every 5 (five) minutes as needed. Chest Pain   omeprazole (PRILOSEC) 20 MG capsule Take 20 mg by mouth daily.   polyethylene glycol powder (GLYCOLAX/MIRALAX) powder MIX 1 CAPFUL (17 GRAMS) WITH 8OZ OF WATER OR JUICE DAILY.   pravastatin (PRAVACHOL) 10 MG tablet Take 10 mg by mouth at bedtime.   pregabalin (LYRICA) 50 MG  capsule TAKE ONE TABLET BY MOUTH EVERYDAY AT BEDTIME FOR nerve pain   spironolactone (ALDACTONE) 25 MG tablet Take 25 mg by mouth 2 (two) times daily.   tiZANidine (ZANAFLEX) 4 MG tablet Take 1 tablet (4 mg total) by mouth every 6 (six) hours as needed for muscle spasms.   traMADol (ULTRAM) 50 MG tablet Take by mouth every 6 (six) hours as needed.     MEDICAL DECISION MAKING  A.  Encounter Diagnoses  Name Primary?   Radicular pain of right lower extremity Yes   Body mass index 45.0-49.9, adult ([place])     B. 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 MANAGEMENT   Nonsurgical management  This is a neurosurgical issue  We can start some medications and have primary care refer the patient to appropriate neurosurgeon if needed  Meds ordered this encounter  Medications   tiZANidine (ZANAFLEX) 4 MG tablet    Sig: Take 1 tablet (4 mg total) by mouth every 6 (six) hours as needed for muscle spasms.    Dispense:  90 tablet    Refill:  0   ibuprofen (ADVIL) 800 MG tablet    Sig: Take 1 tablet (800 mg total) by mouth every 8 (eight) hours as needed.    Dispense:  90 tablet    Refill:  1     Arther Abbott, MD  11/13/2022 1:36 PM

## 2022-11-20 DIAGNOSIS — E211 Secondary hyperparathyroidism, not elsewhere classified: Secondary | ICD-10-CM | POA: Diagnosis not present

## 2022-11-20 DIAGNOSIS — I129 Hypertensive chronic kidney disease with stage 1 through stage 4 chronic kidney disease, or unspecified chronic kidney disease: Secondary | ICD-10-CM | POA: Diagnosis not present

## 2022-11-20 DIAGNOSIS — E875 Hyperkalemia: Secondary | ICD-10-CM | POA: Diagnosis not present

## 2022-11-20 DIAGNOSIS — E1122 Type 2 diabetes mellitus with diabetic chronic kidney disease: Secondary | ICD-10-CM | POA: Diagnosis not present

## 2022-11-20 DIAGNOSIS — N189 Chronic kidney disease, unspecified: Secondary | ICD-10-CM | POA: Diagnosis not present

## 2022-11-28 DIAGNOSIS — E211 Secondary hyperparathyroidism, not elsewhere classified: Secondary | ICD-10-CM | POA: Diagnosis not present

## 2022-11-28 DIAGNOSIS — D638 Anemia in other chronic diseases classified elsewhere: Secondary | ICD-10-CM | POA: Diagnosis not present

## 2022-11-28 DIAGNOSIS — R600 Localized edema: Secondary | ICD-10-CM | POA: Diagnosis not present

## 2022-11-28 DIAGNOSIS — I129 Hypertensive chronic kidney disease with stage 1 through stage 4 chronic kidney disease, or unspecified chronic kidney disease: Secondary | ICD-10-CM | POA: Diagnosis not present

## 2022-11-28 DIAGNOSIS — E1122 Type 2 diabetes mellitus with diabetic chronic kidney disease: Secondary | ICD-10-CM | POA: Diagnosis not present

## 2022-11-28 DIAGNOSIS — N189 Chronic kidney disease, unspecified: Secondary | ICD-10-CM | POA: Diagnosis not present

## 2022-12-19 ENCOUNTER — Encounter (HOSPITAL_COMMUNITY): Payer: Self-pay

## 2022-12-19 ENCOUNTER — Emergency Department (HOSPITAL_COMMUNITY)
Admission: EM | Admit: 2022-12-19 | Discharge: 2022-12-19 | Disposition: A | Discharge status: Home / Self Care | Payer: 59 | Attending: Emergency Medicine | Admitting: Emergency Medicine

## 2022-12-19 ENCOUNTER — Other Ambulatory Visit: Payer: Self-pay

## 2022-12-19 DIAGNOSIS — N189 Chronic kidney disease, unspecified: Secondary | ICD-10-CM | POA: Insufficient documentation

## 2022-12-19 DIAGNOSIS — M7989 Other specified soft tissue disorders: Secondary | ICD-10-CM | POA: Diagnosis not present

## 2022-12-19 DIAGNOSIS — E119 Type 2 diabetes mellitus without complications: Secondary | ICD-10-CM | POA: Diagnosis not present

## 2022-12-19 DIAGNOSIS — I509 Heart failure, unspecified: Secondary | ICD-10-CM | POA: Insufficient documentation

## 2022-12-19 DIAGNOSIS — M79605 Pain in left leg: Secondary | ICD-10-CM | POA: Insufficient documentation

## 2022-12-19 MED ORDER — LIDOCAINE 5 % EX PTCH: 1.0000 | MEDICATED_PATCH | CUTANEOUS | 0 refills | Status: AC

## 2022-12-19 MED ORDER — LIDOCAINE 5 % EX PTCH
1.0000 | MEDICATED_PATCH | CUTANEOUS | Status: DC
  Administered 2022-12-19: 1 via TRANSDERMAL
  Filled 2022-12-19: qty 1

## 2022-12-19 MED ORDER — LIDOCAINE 5 % EX PTCH: 1.0000 | MEDICATED_PATCH | CUTANEOUS | 0 refills | Status: DC

## 2022-12-19 NOTE — ED Provider Notes (Signed)
[facility] Provider Note   CSN: [id number] Arrival date & time: 12/19/22  1818     History  Chief Complaint  Patient presents with   Leg Pain   HPI Allison Gould is a 72 y.o. female with CKD, CHF, type 2 diabetes, history of lower back pain presenting for left leg pain.  Leg pain has been going on for 2 to 3 weeks.  Located in the lateral aspect of her upper left leg and radiates to her knee.  Denies recent trauma.  Denies chest pain shortness of breath.  The pain feels "like burning".  States she has noticed some mild swelling in both knees here recently.  States today her leg "gave way" but she denies fall.  This prompted her son to bring her to the ED for further evaluation.  He has been taking Tylenol for pain at home.   Leg Pain      Home Medications Prior to Admission medications   Medication Sig Start Date End Date Taking? Authorizing Provider  lidocaine (LIDODERM) 5 % Place 1 patch onto the skin daily. Remove & Discard patch within 12 hours or as directed by MD 12/19/22  Yes Harriet Pho, PA-C  acetaminophen (TYLENOL) 500 MG tablet Take 1,000 mg by mouth every 6 (six) hours as needed for mild pain.    [provider]  albuterol (VENTOLIN HFA) 108 (90 Base) MCG/ACT inhaler Inhale 2 puffs into the lungs every 6 (six) hours as needed. 04/01/21   [provider]  amLODipine (NORVASC) 10 MG tablet Take 10 mg by mouth daily. 06/05/12   [provider]  betamethasone dipropionate (DIPROLENE) 0.05 % cream Apply topically daily.    [provider]  calcitRIOL (ROCALTROL) 0.25 MCG capsule Take 0.25 mcg by mouth daily.    [provider]  Carboxymethylcellul-Glycerin 0.5-0.9 % SOLN Place 1 drop into both eyes daily as needed. Dry Eyes    [provider]  cholecalciferol (VITAMIN D) 1000 UNITS tablet Take 1,000 Units by mouth daily.    [provider]  docusate sodium (COLACE) 100  MG capsule Take 1 capsule (100 mg total) by mouth daily as needed for mild constipation or moderate constipation. 07/12/22   Milton Ferguson, MD  Fluticasone-Umeclidin-Vilant (TRELEGY ELLIPTA IN) Inhale into the lungs.    [provider]  furosemide (LASIX) 40 MG tablet Take 1 tablet (40 mg total) by mouth daily. 04/15/21   Fay Records, MD  hydroxychloroquine (PLAQUENIL) 200 MG tablet Take 200 mg by mouth 2 (two) times daily.    [provider]  ibuprofen (ADVIL) 800 MG tablet Take 1 tablet (800 mg total) by mouth every 8 (eight) hours as needed. 11/13/22   Carole Civil, MD  lisinopril (PRINIVIL,ZESTRIL) 40 MG tablet Take 40 mg by mouth daily. 06/05/12   [provider]  nitroGLYCERIN (NITROSTAT) 0.4 MG SL tablet Place 0.4 mg under the tongue every 5 (five) minutes as needed. Chest Pain    [provider]  omeprazole (PRILOSEC) 20 MG capsule Take 20 mg by mouth daily. 06/05/12   [provider]  polyethylene glycol powder (GLYCOLAX/MIRALAX) powder MIX 1 CAPFUL (17 GRAMS) WITH 8OZ OF WATER OR JUICE DAILY. 05/01/16   Carlis Stable, NP  pravastatin (PRAVACHOL) 10 MG tablet Take 10 mg by mouth at bedtime. 04/01/21   [provider]  pregabalin (LYRICA) 50 MG capsule TAKE ONE TABLET BY MOUTH EVERYDAY AT BEDTIME FOR nerve pain 09/05/20  [provider]  spironolactone (ALDACTONE) 25 MG tablet Take 25 mg by mouth 2 (two) times daily. 05/07/12   [provider]  tiZANidine (ZANAFLEX) 4 MG tablet Take 1 tablet (4 mg total) by mouth every 6 (six) hours as needed for muscle spasms. 11/13/22   Carole Civil, MD  traMADol (ULTRAM) 50 MG tablet Take by mouth every 6 (six) hours as needed.    [provider]      Allergies    Patient has no known allergies.    Review of Systems   See HPI for pertinent positives   Physical Exam   Vitals:   12/19/22 1846  BP: (!) 126/48  Pulse: 73  Resp: 18  Temp: 98.2 F (36.8 C)  SpO2:  100%    CONSTITUTIONAL:  well-appearing, NAD NEURO:  Alert and oriented x 3, CN 3-12 grossly intact EYES:  eyes equal and reactive ENT/NECK:  Supple, no stridor  CARDIO:  appears well-perfused  PULM:  No respiratory distress GI/GU:  non-distended MSK/SPINE: Both legs appear symmetric in color and size.  Noted bilateral 1+ pedal edema.  DP pulses 2+ bilaterally.  Tenderness to palpation of the left lateral aspect of the thigh.  No evidence of effusion or swelling and no tenderness around both knees.  Range of motion in both legs appears grossly intact. SKIN:  no rash, atraumatic   *Additional and/or pertinent findings included in MDM below    ED Results / Procedures / Treatments   Labs (all labs ordered are listed, but only abnormal results are displayed) Labs Reviewed - No data to display  EKG None  Radiology No results found.  Procedures Procedures    Medications Ordered in ED Medications  lidocaine (LIDODERM) 5 % 1 patch (has no administration in time range)    ED Course/ Medical Decision Making/ A&P                             Medical Decision Making  72 year old female who is well-appearing and hemodynamically stable presenting for leg pain.  Exam revealed some tenderness about the lateral aspect of the left thigh but otherwise unremarkable.  DDx includes DVT, soft tissue injury, trauma, sciatica, and PAD.  Doubt DVT given no asymmetric swelling or discoloration of the left leg in comparison to the right.  Pain and tenderness seems more superficial also.  Trauma unlikely given patient denies recent trauma.  PAD is also unlikely given distal pulses are 2+ bilaterally.  Also doubt sciatica given no left buttock or lower back pain or tenderness on exam.  Doubt hip involvement as well. Suspect it is some kind of soft tissue injury.  Treated with Lidoderm patch.  Sent additional Lidoderm patches to her pharmacy.  Advised her to follow-up with her PCP.        Final  Clinical Impression(s) / ED Diagnoses Final diagnoses:  Left leg pain    Rx / DC Orders ED Discharge Orders          Ordered    lidocaine (LIDODERM) 5 %  Every 24 hours        12/19/22 2026              Harriet Pho, PA-C 12/19/22 2027    Milton Ferguson, MD 12/21/22 1208

## 2022-12-19 NOTE — ED Triage Notes (Signed)
Complaining of pain in the left leg that started yesterday. Hurts to move around. Hurts around the knee.

## 2022-12-19 NOTE — Discharge Instructions (Signed)
Evaluation for your left leg pain was overall reassuring.  I suspect it is a soft tissue injury of some kind.  I have treated it with a Lidoderm patch will which will provide some topical relief for your pain.  I have also sent more Lidoderm patches to your pharmacy.  Recommend that you follow-up with your PCP for ongoing reevaluation of what is likely chronic leg pain.  Return to the emergency department for reevaluation if you notice worsening swelling, discoloration, numbness, tingling in that extremity, chest pain or shortness of breath or any other concerning symptom.

## 2022-12-23 ENCOUNTER — Telehealth: Payer: Self-pay

## 2022-12-23 DIAGNOSIS — Z7409 Other reduced mobility: Secondary | ICD-10-CM | POA: Diagnosis not present

## 2022-12-23 DIAGNOSIS — M79605 Pain in left leg: Secondary | ICD-10-CM | POA: Diagnosis not present

## 2022-12-23 NOTE — Telephone Encounter (Signed)
     Patient  visit on 2/16  at [facility] you been able to follow up with your primary care physician? Yes   The patient was or was not able to obtain any needed medicine or equipment. No   Are there diet recommendations that you are having difficulty following? Yes   Patient expresses understanding of discharge instructions and education provided has no other needs at this time.  Yes     [facility] [phone] [address], [place], [place] [address] Phone: [phone] Email: [email or web address]

## 2022-12-24 DIAGNOSIS — M9903 Segmental and somatic dysfunction of lumbar region: Secondary | ICD-10-CM | POA: Diagnosis not present

## 2022-12-24 DIAGNOSIS — M5442 Lumbago with sciatica, left side: Secondary | ICD-10-CM | POA: Diagnosis not present

## 2022-12-24 DIAGNOSIS — M9902 Segmental and somatic dysfunction of thoracic region: Secondary | ICD-10-CM | POA: Diagnosis not present

## 2022-12-24 DIAGNOSIS — M9905 Segmental and somatic dysfunction of pelvic region: Secondary | ICD-10-CM | POA: Diagnosis not present

## 2022-12-26 DIAGNOSIS — M9902 Segmental and somatic dysfunction of thoracic region: Secondary | ICD-10-CM | POA: Diagnosis not present

## 2022-12-26 DIAGNOSIS — M9905 Segmental and somatic dysfunction of pelvic region: Secondary | ICD-10-CM | POA: Diagnosis not present

## 2022-12-26 DIAGNOSIS — M9903 Segmental and somatic dysfunction of lumbar region: Secondary | ICD-10-CM | POA: Diagnosis not present

## 2022-12-26 DIAGNOSIS — M5442 Lumbago with sciatica, left side: Secondary | ICD-10-CM | POA: Diagnosis not present

## 2022-12-31 DIAGNOSIS — M9902 Segmental and somatic dysfunction of thoracic region: Secondary | ICD-10-CM | POA: Diagnosis not present

## 2022-12-31 DIAGNOSIS — M9905 Segmental and somatic dysfunction of pelvic region: Secondary | ICD-10-CM | POA: Diagnosis not present

## 2022-12-31 DIAGNOSIS — M9903 Segmental and somatic dysfunction of lumbar region: Secondary | ICD-10-CM | POA: Diagnosis not present

## 2022-12-31 DIAGNOSIS — M5442 Lumbago with sciatica, left side: Secondary | ICD-10-CM | POA: Diagnosis not present

## 2023-01-02 DIAGNOSIS — M5442 Lumbago with sciatica, left side: Secondary | ICD-10-CM | POA: Diagnosis not present

## 2023-01-02 DIAGNOSIS — M9905 Segmental and somatic dysfunction of pelvic region: Secondary | ICD-10-CM | POA: Diagnosis not present

## 2023-01-02 DIAGNOSIS — M9903 Segmental and somatic dysfunction of lumbar region: Secondary | ICD-10-CM | POA: Diagnosis not present

## 2023-01-02 DIAGNOSIS — M9902 Segmental and somatic dysfunction of thoracic region: Secondary | ICD-10-CM | POA: Diagnosis not present

## 2023-01-05 DIAGNOSIS — M9903 Segmental and somatic dysfunction of lumbar region: Secondary | ICD-10-CM | POA: Diagnosis not present

## 2023-01-05 DIAGNOSIS — M9902 Segmental and somatic dysfunction of thoracic region: Secondary | ICD-10-CM | POA: Diagnosis not present

## 2023-01-05 DIAGNOSIS — M9905 Segmental and somatic dysfunction of pelvic region: Secondary | ICD-10-CM | POA: Diagnosis not present

## 2023-01-05 DIAGNOSIS — M5442 Lumbago with sciatica, left side: Secondary | ICD-10-CM | POA: Diagnosis not present

## 2023-01-09 DIAGNOSIS — M9903 Segmental and somatic dysfunction of lumbar region: Secondary | ICD-10-CM | POA: Diagnosis not present

## 2023-01-09 DIAGNOSIS — M9902 Segmental and somatic dysfunction of thoracic region: Secondary | ICD-10-CM | POA: Diagnosis not present

## 2023-01-09 DIAGNOSIS — M5442 Lumbago with sciatica, left side: Secondary | ICD-10-CM | POA: Diagnosis not present

## 2023-01-09 DIAGNOSIS — M9905 Segmental and somatic dysfunction of pelvic region: Secondary | ICD-10-CM | POA: Diagnosis not present

## 2023-01-12 DIAGNOSIS — M9902 Segmental and somatic dysfunction of thoracic region: Secondary | ICD-10-CM | POA: Diagnosis not present

## 2023-01-12 DIAGNOSIS — M5442 Lumbago with sciatica, left side: Secondary | ICD-10-CM | POA: Diagnosis not present

## 2023-01-12 DIAGNOSIS — M9903 Segmental and somatic dysfunction of lumbar region: Secondary | ICD-10-CM | POA: Diagnosis not present

## 2023-01-12 DIAGNOSIS — M9905 Segmental and somatic dysfunction of pelvic region: Secondary | ICD-10-CM | POA: Diagnosis not present

## 2023-01-15 DIAGNOSIS — N189 Chronic kidney disease, unspecified: Secondary | ICD-10-CM | POA: Diagnosis not present

## 2023-01-16 DIAGNOSIS — M9902 Segmental and somatic dysfunction of thoracic region: Secondary | ICD-10-CM | POA: Diagnosis not present

## 2023-01-16 DIAGNOSIS — M5442 Lumbago with sciatica, left side: Secondary | ICD-10-CM | POA: Diagnosis not present

## 2023-01-16 DIAGNOSIS — M9905 Segmental and somatic dysfunction of pelvic region: Secondary | ICD-10-CM | POA: Diagnosis not present

## 2023-01-16 DIAGNOSIS — M9903 Segmental and somatic dysfunction of lumbar region: Secondary | ICD-10-CM | POA: Diagnosis not present

## 2023-01-19 DIAGNOSIS — M9903 Segmental and somatic dysfunction of lumbar region: Secondary | ICD-10-CM | POA: Diagnosis not present

## 2023-01-19 DIAGNOSIS — M5442 Lumbago with sciatica, left side: Secondary | ICD-10-CM | POA: Diagnosis not present

## 2023-01-19 DIAGNOSIS — M9902 Segmental and somatic dysfunction of thoracic region: Secondary | ICD-10-CM | POA: Diagnosis not present

## 2023-01-19 DIAGNOSIS — M9905 Segmental and somatic dysfunction of pelvic region: Secondary | ICD-10-CM | POA: Diagnosis not present

## 2023-01-21 DIAGNOSIS — E785 Hyperlipidemia, unspecified: Secondary | ICD-10-CM | POA: Diagnosis not present

## 2023-01-21 DIAGNOSIS — N189 Chronic kidney disease, unspecified: Secondary | ICD-10-CM | POA: Diagnosis not present

## 2023-01-21 DIAGNOSIS — I10 Essential (primary) hypertension: Secondary | ICD-10-CM | POA: Diagnosis not present

## 2023-01-21 DIAGNOSIS — Z0000 Encounter for general adult medical examination without abnormal findings: Secondary | ICD-10-CM | POA: Diagnosis not present

## 2023-01-21 DIAGNOSIS — E870 Hyperosmolality and hypernatremia: Secondary | ICD-10-CM | POA: Diagnosis not present

## 2023-01-21 DIAGNOSIS — J449 Chronic obstructive pulmonary disease, unspecified: Secondary | ICD-10-CM | POA: Diagnosis not present

## 2023-01-21 DIAGNOSIS — D696 Thrombocytopenia, unspecified: Secondary | ICD-10-CM | POA: Diagnosis not present

## 2023-01-21 DIAGNOSIS — I129 Hypertensive chronic kidney disease with stage 1 through stage 4 chronic kidney disease, or unspecified chronic kidney disease: Secondary | ICD-10-CM | POA: Diagnosis not present

## 2023-01-21 DIAGNOSIS — D631 Anemia in chronic kidney disease: Secondary | ICD-10-CM | POA: Diagnosis not present

## 2023-01-21 DIAGNOSIS — M170 Bilateral primary osteoarthritis of knee: Secondary | ICD-10-CM | POA: Diagnosis not present

## 2023-01-21 DIAGNOSIS — E875 Hyperkalemia: Secondary | ICD-10-CM | POA: Diagnosis not present

## 2023-01-21 DIAGNOSIS — L930 Discoid lupus erythematosus: Secondary | ICD-10-CM | POA: Diagnosis not present

## 2023-01-23 DIAGNOSIS — M9902 Segmental and somatic dysfunction of thoracic region: Secondary | ICD-10-CM | POA: Diagnosis not present

## 2023-01-23 DIAGNOSIS — M9903 Segmental and somatic dysfunction of lumbar region: Secondary | ICD-10-CM | POA: Diagnosis not present

## 2023-01-23 DIAGNOSIS — M5442 Lumbago with sciatica, left side: Secondary | ICD-10-CM | POA: Diagnosis not present

## 2023-01-23 DIAGNOSIS — M9905 Segmental and somatic dysfunction of pelvic region: Secondary | ICD-10-CM | POA: Diagnosis not present

## 2023-01-26 DIAGNOSIS — M9903 Segmental and somatic dysfunction of lumbar region: Secondary | ICD-10-CM | POA: Diagnosis not present

## 2023-01-26 DIAGNOSIS — M9905 Segmental and somatic dysfunction of pelvic region: Secondary | ICD-10-CM | POA: Diagnosis not present

## 2023-01-26 DIAGNOSIS — M5442 Lumbago with sciatica, left side: Secondary | ICD-10-CM | POA: Diagnosis not present

## 2023-01-26 DIAGNOSIS — M9902 Segmental and somatic dysfunction of thoracic region: Secondary | ICD-10-CM | POA: Diagnosis not present

## 2023-02-02 DIAGNOSIS — M5442 Lumbago with sciatica, left side: Secondary | ICD-10-CM | POA: Diagnosis not present

## 2023-02-02 DIAGNOSIS — M9905 Segmental and somatic dysfunction of pelvic region: Secondary | ICD-10-CM | POA: Diagnosis not present

## 2023-02-02 DIAGNOSIS — M9903 Segmental and somatic dysfunction of lumbar region: Secondary | ICD-10-CM | POA: Diagnosis not present

## 2023-02-02 DIAGNOSIS — M9902 Segmental and somatic dysfunction of thoracic region: Secondary | ICD-10-CM | POA: Diagnosis not present

## 2023-02-04 DIAGNOSIS — D638 Anemia in other chronic diseases classified elsewhere: Secondary | ICD-10-CM | POA: Diagnosis not present

## 2023-02-04 DIAGNOSIS — E1122 Type 2 diabetes mellitus with diabetic chronic kidney disease: Secondary | ICD-10-CM | POA: Diagnosis not present

## 2023-02-04 DIAGNOSIS — E211 Secondary hyperparathyroidism, not elsewhere classified: Secondary | ICD-10-CM | POA: Diagnosis not present

## 2023-02-04 DIAGNOSIS — N189 Chronic kidney disease, unspecified: Secondary | ICD-10-CM | POA: Diagnosis not present

## 2023-02-04 DIAGNOSIS — I129 Hypertensive chronic kidney disease with stage 1 through stage 4 chronic kidney disease, or unspecified chronic kidney disease: Secondary | ICD-10-CM | POA: Diagnosis not present

## 2023-02-06 DIAGNOSIS — E1122 Type 2 diabetes mellitus with diabetic chronic kidney disease: Secondary | ICD-10-CM | POA: Diagnosis not present

## 2023-02-06 DIAGNOSIS — D638 Anemia in other chronic diseases classified elsewhere: Secondary | ICD-10-CM | POA: Diagnosis not present

## 2023-02-06 DIAGNOSIS — N189 Chronic kidney disease, unspecified: Secondary | ICD-10-CM | POA: Diagnosis not present

## 2023-02-06 DIAGNOSIS — E211 Secondary hyperparathyroidism, not elsewhere classified: Secondary | ICD-10-CM | POA: Diagnosis not present

## 2023-02-06 DIAGNOSIS — I129 Hypertensive chronic kidney disease with stage 1 through stage 4 chronic kidney disease, or unspecified chronic kidney disease: Secondary | ICD-10-CM | POA: Diagnosis not present

## 2023-02-09 DIAGNOSIS — M5442 Lumbago with sciatica, left side: Secondary | ICD-10-CM | POA: Diagnosis not present

## 2023-02-09 DIAGNOSIS — M9905 Segmental and somatic dysfunction of pelvic region: Secondary | ICD-10-CM | POA: Diagnosis not present

## 2023-02-09 DIAGNOSIS — M9902 Segmental and somatic dysfunction of thoracic region: Secondary | ICD-10-CM | POA: Diagnosis not present

## 2023-02-09 DIAGNOSIS — M9903 Segmental and somatic dysfunction of lumbar region: Secondary | ICD-10-CM | POA: Diagnosis not present

## 2023-02-16 DIAGNOSIS — M9902 Segmental and somatic dysfunction of thoracic region: Secondary | ICD-10-CM | POA: Diagnosis not present

## 2023-02-16 DIAGNOSIS — M9905 Segmental and somatic dysfunction of pelvic region: Secondary | ICD-10-CM | POA: Diagnosis not present

## 2023-02-16 DIAGNOSIS — M9903 Segmental and somatic dysfunction of lumbar region: Secondary | ICD-10-CM | POA: Diagnosis not present

## 2023-02-16 DIAGNOSIS — M5442 Lumbago with sciatica, left side: Secondary | ICD-10-CM | POA: Diagnosis not present

## 2023-02-23 DIAGNOSIS — M9902 Segmental and somatic dysfunction of thoracic region: Secondary | ICD-10-CM | POA: Diagnosis not present

## 2023-02-23 DIAGNOSIS — M9905 Segmental and somatic dysfunction of pelvic region: Secondary | ICD-10-CM | POA: Diagnosis not present

## 2023-02-23 DIAGNOSIS — M5442 Lumbago with sciatica, left side: Secondary | ICD-10-CM | POA: Diagnosis not present

## 2023-02-23 DIAGNOSIS — M9903 Segmental and somatic dysfunction of lumbar region: Secondary | ICD-10-CM | POA: Diagnosis not present

## 2023-02-25 DIAGNOSIS — M9903 Segmental and somatic dysfunction of lumbar region: Secondary | ICD-10-CM | POA: Diagnosis not present

## 2023-02-25 DIAGNOSIS — M5442 Lumbago with sciatica, left side: Secondary | ICD-10-CM | POA: Diagnosis not present

## 2023-02-25 DIAGNOSIS — M9905 Segmental and somatic dysfunction of pelvic region: Secondary | ICD-10-CM | POA: Diagnosis not present

## 2023-02-25 DIAGNOSIS — M9902 Segmental and somatic dysfunction of thoracic region: Secondary | ICD-10-CM | POA: Diagnosis not present

## 2023-03-06 DIAGNOSIS — M9903 Segmental and somatic dysfunction of lumbar region: Secondary | ICD-10-CM | POA: Diagnosis not present

## 2023-03-06 DIAGNOSIS — M5442 Lumbago with sciatica, left side: Secondary | ICD-10-CM | POA: Diagnosis not present

## 2023-03-06 DIAGNOSIS — M9905 Segmental and somatic dysfunction of pelvic region: Secondary | ICD-10-CM | POA: Diagnosis not present

## 2023-03-06 DIAGNOSIS — M9902 Segmental and somatic dysfunction of thoracic region: Secondary | ICD-10-CM | POA: Diagnosis not present

## 2023-03-18 DIAGNOSIS — M79671 Pain in right foot: Secondary | ICD-10-CM | POA: Diagnosis not present

## 2023-03-18 DIAGNOSIS — M25512 Pain in left shoulder: Secondary | ICD-10-CM | POA: Diagnosis not present

## 2023-03-20 DIAGNOSIS — M9903 Segmental and somatic dysfunction of lumbar region: Secondary | ICD-10-CM | POA: Diagnosis not present

## 2023-03-20 DIAGNOSIS — M25512 Pain in left shoulder: Secondary | ICD-10-CM | POA: Diagnosis not present

## 2023-03-20 DIAGNOSIS — M9905 Segmental and somatic dysfunction of pelvic region: Secondary | ICD-10-CM | POA: Diagnosis not present

## 2023-03-20 DIAGNOSIS — M9902 Segmental and somatic dysfunction of thoracic region: Secondary | ICD-10-CM | POA: Diagnosis not present

## 2023-03-20 DIAGNOSIS — M5442 Lumbago with sciatica, left side: Secondary | ICD-10-CM | POA: Diagnosis not present

## 2023-04-03 DIAGNOSIS — M9902 Segmental and somatic dysfunction of thoracic region: Secondary | ICD-10-CM | POA: Diagnosis not present

## 2023-04-03 DIAGNOSIS — M5442 Lumbago with sciatica, left side: Secondary | ICD-10-CM | POA: Diagnosis not present

## 2023-04-03 DIAGNOSIS — M9905 Segmental and somatic dysfunction of pelvic region: Secondary | ICD-10-CM | POA: Diagnosis not present

## 2023-04-03 DIAGNOSIS — M9903 Segmental and somatic dysfunction of lumbar region: Secondary | ICD-10-CM | POA: Diagnosis not present

## 2023-04-03 DIAGNOSIS — M25512 Pain in left shoulder: Secondary | ICD-10-CM | POA: Diagnosis not present

## 2023-04-09 ENCOUNTER — Encounter (HOSPITAL_COMMUNITY): Payer: Self-pay | Admitting: Occupational Therapy

## 2023-04-09 ENCOUNTER — Other Ambulatory Visit: Payer: Self-pay

## 2023-04-09 ENCOUNTER — Ambulatory Visit (HOSPITAL_COMMUNITY): Payer: 59 | Attending: Family Medicine | Admitting: Occupational Therapy

## 2023-04-09 DIAGNOSIS — R2681 Unsteadiness on feet: Secondary | ICD-10-CM | POA: Diagnosis not present

## 2023-04-09 DIAGNOSIS — M25512 Pain in left shoulder: Secondary | ICD-10-CM | POA: Diagnosis not present

## 2023-04-09 DIAGNOSIS — R29898 Other symptoms and signs involving the musculoskeletal system: Secondary | ICD-10-CM | POA: Diagnosis not present

## 2023-04-09 DIAGNOSIS — G8929 Other chronic pain: Secondary | ICD-10-CM | POA: Diagnosis not present

## 2023-04-09 DIAGNOSIS — M25511 Pain in right shoulder: Secondary | ICD-10-CM | POA: Diagnosis not present

## 2023-04-09 DIAGNOSIS — R278 Other lack of coordination: Secondary | ICD-10-CM | POA: Diagnosis not present

## 2023-04-09 NOTE — Patient Instructions (Signed)

## 2023-04-09 NOTE — Therapy (Signed)
OUTPATIENT OCCUPATIONAL THERAPY ORTHO EVALUATION  Patient Name: Allison Gould MRN: [id number] DOB:[birth year], 72 y.o., female Today's Date: 04/09/2023   END OF SESSION:  OT End of Session - 04/09/23 1116     Visit Number 1    Number of Visits 12    Date for OT Re-Evaluation 05/21/23   progress note 6/27   Authorization Type [employer]    Progress Note Due on Visit 10    OT Start Time 1028    OT Stop Time 1109    OT Time Calculation (min) 41 min    Activity Tolerance Patient tolerated treatment well    Behavior During Therapy WFL for tasks assessed/performed             Past Medical History:  Diagnosis Date   Anxiety    Diabetes mellitus type II    GERD (gastroesophageal reflux disease)    Hearing aid worn    right   Hypertension    Lipid disorder    Loss of hair    for unknown reason, to see MD    Low back pain    Morbid obesity with BMI of 45.0-49.9, adult (HCC)    Osteoarthritis    Past Surgical History:  Procedure Laterality Date   CATARACT EXTRACTION W/PHACO Left 05/12/2016   Procedure: CATARACT EXTRACTION PHACO AND INTRAOCULAR LENS PLACEMENT (IOC);  Surgeon: Gemma Payor, MD;  Location: [facility];  Service: Ophthalmology;  Laterality: Left;  CDE: 6.63   CATARACT EXTRACTION W/PHACO Right 06/16/2016   Procedure: CATARACT EXTRACTION PHACO AND INTRAOCULAR LENS PLACEMENT (IOC); CDE:  4.13;  Surgeon: Gemma Payor, MD;  Location: [facility];  Service: Ophthalmology;  Laterality: Right;   COLONOSCOPY  07/12/2012   Procedure: COLONOSCOPY;  Surgeon: West Bali, MD;  Location: [facility];  Service: Endoscopy;  Laterality: N/A;  1:00   lipoma-right shoulder     spur and nerve repair right shoulder     Patient Active Problem List   Diagnosis Date Noted   Hyperkalemia 09/17/2022   Hypernatremia 09/17/2022   Localized edema 07/23/2022   Paresthesia of hand 07/23/2022   Congestive heart failure (HCC) 07/12/2022   Chronic obstructive lung disease (HCC) 01/27/2022   Morbid  obesity (HCC) 01/27/2022   Edema of lower extremity 09/30/2021   Lupus erythematosus 09/25/2021   Chronic insomnia 08/21/2021   Chronic kidney disease 06/27/2021   Thrombocytopenic disorder (HCC) 06/27/2021   Gastroesophageal reflux disease 05/24/2021   Hyperlipidemia 05/24/2021   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   Leg weakness 04/14/2013   Back pain 04/14/2013   Knee pain 04/14/2013   Pain in joint, shoulder region 03/11/2013   Muscle weakness (generalized) 03/11/2013   Pain in joint, ankle and foot 03/11/2013   Abnormality of gait 03/11/2013   Right ankle sprain 03/02/2013   Shoulder contusion 03/02/2013   Constipation 06/22/2012   Colon cancer screening 06/22/2012   Pain in limb 07/05/2009   FLANK PAIN, RIGHT 04/04/2009   ELECTROCARDIOGRAM, ABNORMAL 02/20/2009   DIABETES MELLITUS, WITH NEUROLOGICAL COMPLICATIONS 01/09/2009   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 Dwana Melena REFERRING PROVIDER: Lupita Raider, NP  ONSET DATE: ~03/16/23  REFERRING DIAG: left shoulder pain  THERAPY DIAG:  Acute pain of left shoulder  Other symptoms and signs involving the musculoskeletal system  Rationale for Evaluation and Treatment: Rehabilitation  SUBJECTIVE:   SUBJECTIVE STATEMENT: S: I've been using my arm but then I noticed it just started hurting.  Pt accompanied by: self  PERTINENT HISTORY: Pt is a 72 y/o female presenting with left shoulder pain present for approximately 3 weeks. Pt goes to the chiropractor and reports no improvement. Pt reports she does not remember a precipitating event.   PRECAUTIONS: None  WEIGHT BEARING RESTRICTIONS: No  PAIN:  Are you having pain? Yes: NPRS scale: 8/10 Pain location: left shoulder Pain description: sore, aching Aggravating factors:  certain movements Relieving factors: rest, Tylenol, pain cream   FALLS: Has patient fallen in last 6 months? No  PLOF: Independent  PATIENT GOALS: To have less pin in the left arm.   NEXT MD VISIT: 04/10/23  OBJECTIVE:   HAND DOMINANCE: Right  ADLs: Overall ADLs: Pt reports difficulty with reaching overhead, across body, and behind back. Pt has difficulty with dressing-specifically putting on a bra, bathing, fixing her hair, completing housework like pulling trash out of the can. Wakes up due to pain at night. Getting in and out of the tub is also difficult.   FUNCTIONAL OUTCOME MEASURES: FOTO: 51/100  UPPER EXTREMITY ROM:       Assessed in supine, er/IR adducted  Passive ROM Left eval  Shoulder flexion 159  Shoulder abduction 158  Shoulder internal rotation 90  Shoulder external rotation 75  (Blank rows = not tested)    Assessed in sitting, er/IR adducted  Active ROM Left eval  Shoulder flexion 108  Shoulder abduction 81  Shoulder internal rotation 90  Shoulder external rotation 43  (Blank rows = not tested)    UPPER EXTREMITY MMT:     Assessed in sitting, er/IR adducted  MMT Left eval  Shoulder flexion 4-/5  Shoulder abduction 3-/5  Shoulder internal rotation 4/5  Shoulder external rotation 3+/5  (Blank rows = not tested)  HAND FUNCTION: Grip strength: Right: 60 lbs; Left: 35 lbs  COGNITION: Overall cognitive status: Within functional limits for tasks assessed  OBSERVATIONS: mod fascial restrictions along upper arm, anterior shoulder, and trapezius regions   TODAY'S TREATMENT:                                                                                                                              DATE: N/A-eval    PATIENT EDUCATION: Education details: AA/ROM Person educated: Patient Education method: Programmer, multimedia, Facilities manager, and Handouts Education comprehension: verbalized understanding and returned demonstration  HOME EXERCISE  PROGRAM: Eval: AA/ROM  GOALS: Goals reviewed with patient? Yes   SHORT TERM GOALS: Target date: 04/30/23  Pt will be provided with and educated on HEP to improve mobility in LUE required for use during ADL completion.   Goal status: INITIAL   2.  Pt will increase LUE strength to 4-/5 to improve ability to reach for items at waist to chest height during bathing and grooming tasks.   Goal status: INITIAL    LONG TERM GOALS:  Target date: 04/21/23  Pt will decrease pain in LUE to 3/10 or less to improve ability to sleep for 2+ consecutive hours without waking due to pain.   Goal status: INITIAL  2.  Pt will decrease LUE fascial restrictions to min amounts or less to improve mobility required for functional reaching tasks.   Goal status: INITIAL  3.  Pt will increase LUE A/ROM by 30 degrees or greater to improve ability to use LUE when reaching overhead or behind back during dressing and bathing tasks.   Goal status: INITIAL  4.  Pt will increase LUE strength to 4/5 or greater to improve ability to use LUE when lifting trash or carrying items during meal preparation/housework/yardwork tasks.   Goal status: INITIAL   ASSESSMENT:  CLINICAL IMPRESSION: Patient is a 72 y.o. female who was seen today for occupational therapy evaluation for left shoulder pain that began approximately 3 weeks ago. Pt presents with increased pain and fascial restrictions, decreased ROM, strength, and functional use of the LUE. Pt demonstrates limited ROM and strength impacting her ability to perform lifting and reaching tasks, reports pain with movement-notably abduction. Pt will benefit from skilled OT services to address the above deficits and improve functional use of LUE during ADLs.  PERFORMANCE DEFICITS: in functional skills including in functional skills including ADLs, IADLs, coordination, tone, ROM, strength, pain, fascial restrictions, muscle spasms, and UE functional use  IMPAIRMENTS: are  limiting patient from ADLs, IADLs, rest and sleep, and leisure.   COMORBIDITIES: may have co-morbidities  that affects occupational performance. Patient will benefit from skilled OT to address above impairments and improve overall function.  MODIFICATION OR ASSISTANCE TO COMPLETE EVALUATION: No modification of tasks or assist necessary to complete an evaluation.  OT OCCUPATIONAL PROFILE AND HISTORY: Problem focused assessment: Including review of records relating to presenting problem.  CLINICAL DECISION MAKING: LOW - limited treatment options, no task modification necessary  REHAB POTENTIAL: Good  EVALUATION COMPLEXITY: Low      PLAN:  OT FREQUENCY: 2x/week  OT DURATION: 6 weeks  PLANNED INTERVENTIONS: self care/ADL training, therapeutic exercise, therapeutic activity, neuromuscular re-education, manual therapy, passive range of motion, splinting, electrical stimulation, ultrasound, moist heat, cryotherapy, patient/family education, and DME and/or AE instructions  RECOMMENDED OTHER SERVICES: None  CONSULTED AND AGREED WITH PLAN OF CARE: Patient  PLAN FOR NEXT SESSION: Follow up on HEP, initiate manual techniques, passive stretching, AA/ROM, scapular ROM tasks    Ezra Sites, OTR/L  [phone] 04/09/2023, 11:17 AM

## 2023-04-10 ENCOUNTER — Ambulatory Visit (HOSPITAL_COMMUNITY): Payer: 59 | Admitting: Occupational Therapy

## 2023-04-10 ENCOUNTER — Encounter (HOSPITAL_COMMUNITY): Payer: Self-pay | Admitting: Occupational Therapy

## 2023-04-10 DIAGNOSIS — R2681 Unsteadiness on feet: Secondary | ICD-10-CM | POA: Diagnosis not present

## 2023-04-10 DIAGNOSIS — M25512 Pain in left shoulder: Secondary | ICD-10-CM

## 2023-04-10 DIAGNOSIS — R278 Other lack of coordination: Secondary | ICD-10-CM | POA: Diagnosis not present

## 2023-04-10 DIAGNOSIS — M9902 Segmental and somatic dysfunction of thoracic region: Secondary | ICD-10-CM | POA: Diagnosis not present

## 2023-04-10 DIAGNOSIS — R29898 Other symptoms and signs involving the musculoskeletal system: Secondary | ICD-10-CM

## 2023-04-10 DIAGNOSIS — M5442 Lumbago with sciatica, left side: Secondary | ICD-10-CM | POA: Diagnosis not present

## 2023-04-10 DIAGNOSIS — M25511 Pain in right shoulder: Secondary | ICD-10-CM | POA: Diagnosis not present

## 2023-04-10 DIAGNOSIS — M9905 Segmental and somatic dysfunction of pelvic region: Secondary | ICD-10-CM | POA: Diagnosis not present

## 2023-04-10 DIAGNOSIS — G8929 Other chronic pain: Secondary | ICD-10-CM | POA: Diagnosis not present

## 2023-04-10 DIAGNOSIS — M9903 Segmental and somatic dysfunction of lumbar region: Secondary | ICD-10-CM | POA: Diagnosis not present

## 2023-04-10 NOTE — Patient Instructions (Signed)
1) Seated Row   Sit up straight with elbows by your sides. Pull back with shoulders/elbows, keeping forearms straight, as if pulling back on the reins of a horse. Squeeze shoulder blades together. Repeat _10-15__times, __2-3__sets/day    2) Shoulder Elevation    Sit up straight with arms by your sides. Slowly bring your shoulders up towards your ears. Repeat_10-15__times, __2-3__ sets/day    3) Shoulder Extension    Sit up straight with both arms by your side, draw your arms back behind your waist. Keep your elbows straight. Repeat __10-15__times, __2-3__sets/day.       

## 2023-04-10 NOTE — Therapy (Signed)
OUTPATIENT OCCUPATIONAL THERAPY ORTHO TREATMENT  Patient Name: Allison Gould MRN: [id number] DOB:[birth year], 72 y.o., female Today's Date: 04/10/2023   END OF SESSION:  OT End of Session - 04/10/23 1021     Visit Number 2    Number of Visits 12    Date for OT Re-Evaluation 05/21/23    Authorization Type [employer]    Authorization - Visit Number 1    Progress Note Due on Visit 10    OT Start Time 0945    OT Stop Time 1025    OT Time Calculation (min) 40 min    Activity Tolerance Patient tolerated treatment well    Behavior During Therapy WFL for tasks assessed/performed             Past Medical History:  Diagnosis Date   Anxiety    Diabetes mellitus type II    GERD (gastroesophageal reflux disease)    Hearing aid worn    right   Hypertension    Lipid disorder    Loss of hair    for unknown reason, to see MD    Low back pain    Morbid obesity with BMI of 45.0-49.9, adult (HCC)    Osteoarthritis    Past Surgical History:  Procedure Laterality Date   CATARACT EXTRACTION W/PHACO Left 05/12/2016   Procedure: CATARACT EXTRACTION PHACO AND INTRAOCULAR LENS PLACEMENT (IOC);  Surgeon: Gemma Payor, MD;  Location: [facility];  Service: Ophthalmology;  Laterality: Left;  CDE: 6.63   CATARACT EXTRACTION W/PHACO Right 06/16/2016   Procedure: CATARACT EXTRACTION PHACO AND INTRAOCULAR LENS PLACEMENT (IOC); CDE:  4.13;  Surgeon: Gemma Payor, MD;  Location: [facility];  Service: Ophthalmology;  Laterality: Right;   COLONOSCOPY  07/12/2012   Procedure: COLONOSCOPY;  Surgeon: West Bali, MD;  Location: [facility];  Service: Endoscopy;  Laterality: N/A;  1:00   lipoma-right shoulder     spur and nerve repair right shoulder     Patient Active Problem List   Diagnosis Date Noted   Hyperkalemia 09/17/2022   Hypernatremia 09/17/2022   Localized edema 07/23/2022   Paresthesia of hand 07/23/2022   Congestive heart failure (HCC) 07/12/2022   Chronic obstructive lung disease (HCC) 01/27/2022    Morbid obesity (HCC) 01/27/2022   Edema of lower extremity 09/30/2021   Lupus erythematosus 09/25/2021   Chronic insomnia 08/21/2021   Chronic kidney disease 06/27/2021   Thrombocytopenic disorder (HCC) 06/27/2021   Gastroesophageal reflux disease 05/24/2021   Hyperlipidemia 05/24/2021   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   Leg weakness 04/14/2013   Back pain 04/14/2013   Knee pain 04/14/2013   Pain in joint, shoulder region 03/11/2013   Muscle weakness (generalized) 03/11/2013   Pain in joint, ankle and foot 03/11/2013   Abnormality of gait 03/11/2013   Right ankle sprain 03/02/2013   Shoulder contusion 03/02/2013   Constipation 06/22/2012   Colon cancer screening 06/22/2012   Pain in limb 07/05/2009   FLANK PAIN, RIGHT 04/04/2009   ELECTROCARDIOGRAM, ABNORMAL 02/20/2009   DIABETES MELLITUS, WITH NEUROLOGICAL COMPLICATIONS 01/09/2009   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 Dwana Melena REFERRING PROVIDER: Lupita Raider, NP  ONSET DATE: ~03/16/23  REFERRING DIAG: left shoulder pain  THERAPY DIAG:  Acute pain of left shoulder  Other symptoms and signs involving the musculoskeletal system  Rationale for Evaluation  and Treatment: Rehabilitation  SUBJECTIVE:   SUBJECTIVE STATEMENT: S: I have been having a little less pain  Pt accompanied by: self  PERTINENT HISTORY: Pt is a 72 y/o female presenting with left shoulder pain present for approximately 3 weeks. Pt goes to the chiropractor and reports no improvement. Pt reports she does not remember a precipitating event.   PRECAUTIONS: None  WEIGHT BEARING RESTRICTIONS: No  PAIN:  Are you having pain? Yes: NPRS scale:  /10 Pain location: left shoulder Pain description: sore, aching Aggravating factors: certain  movements Relieving factors: rest, Tylenol, pain cream   FALLS: Has patient fallen in last 6 months? No  PLOF: Independent  PATIENT GOALS: To have less pin in the left arm.   NEXT MD VISIT: 04/10/23  OBJECTIVE:   HAND DOMINANCE: Right  ADLs: Overall ADLs: Pt reports difficulty with reaching overhead, across body, and behind back. Pt has difficulty with dressing-specifically putting on a bra, bathing, fixing her hair, completing housework like pulling trash out of the can. Wakes up due to pain at night. Getting in and out of the tub is also difficult.   FUNCTIONAL OUTCOME MEASURES: FOTO: 51/100  UPPER EXTREMITY ROM:       Assessed in supine, er/IR adducted  Passive ROM Left eval  Shoulder flexion 159  Shoulder abduction 158  Shoulder internal rotation 90  Shoulder external rotation 75  (Blank rows = not tested)    Assessed in sitting, er/IR adducted  Active ROM Left eval  Shoulder flexion 108  Shoulder abduction 81  Shoulder internal rotation 90  Shoulder external rotation 43  (Blank rows = not tested)    UPPER EXTREMITY MMT:     Assessed in sitting, er/IR adducted  MMT Left eval  Shoulder flexion 4-/5  Shoulder abduction 3-/5  Shoulder internal rotation 4/5  Shoulder external rotation 3+/5  (Blank rows = not tested)  HAND FUNCTION: Grip strength: Right: 60 lbs; Left: 35 lbs  COGNITION: Overall cognitive status: Within functional limits for tasks assessed  OBSERVATIONS: mod fascial restrictions along upper arm, anterior shoulder, and trapezius regions   TODAY'S TREATMENT:                                                                                                                              DATE:   04/10/2023 -Manual: manual techniques to deltoid, pectoralis, trapezius (pt reports increased pain and tightness in trapezius and pectoralis)  -P/ROM: supine; flexion, abduction, internal/external rotation 7x  -AA/ROM: supine; flexion, abduction,  internal/external rotation 7x  -Scapular ROM: elevation/depression, retraction, row 10x  -Shoulder stretch: walking arms up wall into 75% shoulder flexion, holding 10 seconds at top of stretch 2x        PATIENT EDUCATION: Education details: AA/ROM Person educated: Patient Education method: Programmer, multimedia, Facilities manager, and Handouts Education comprehension: verbalized understanding and returned demonstration  HOME EXERCISE PROGRAM: Eval: AA/ROM  GOALS: Goals reviewed with patient? Yes   SHORT TERM GOALS: Target date: 04/30/23  Pt will be provided with and educated on HEP to improve mobility in LUE required for use during ADL completion.   Goal status: INITIAL   2.  Pt will increase LUE strength to 4-/5 to improve ability to reach for items at waist to chest height during bathing and grooming tasks.   Goal status: INITIAL    LONG TERM GOALS: Target date: 04/21/23  Pt will decrease pain in LUE to 3/10 or less to improve ability to sleep for 2+ consecutive hours without waking due to pain.   Goal status: INITIAL  2.  Pt will decrease LUE fascial restrictions to min amounts or less to improve mobility required for functional reaching tasks.   Goal status: INITIAL  3.  Pt will increase LUE A/ROM by 30 degrees or greater to improve ability to use LUE when reaching overhead or behind back during dressing and bathing tasks.   Goal status: INITIAL  4.  Pt will increase LUE strength to 4/5 or greater to improve ability to use LUE when lifting trash or carrying items during meal preparation/housework/yardwork tasks.   Goal status: INITIAL   ASSESSMENT:  CLINICAL IMPRESSION: Pt reports decreased pain from previous session. She stated that she has been completing AA/ROM exercises at home and they have been going well. Began session with manual techniques, noted tightness and pt reports pain with trapezius and pectoralis. Completed P/ROM and A/ROM. Pt reports more pain with  flexion this session. Attempted x to v arms, unable to achieve more than 50% flexion. Added in scapular ROM to HEP.    PERFORMANCE DEFICITS: in functional skills including in functional skills including ADLs, IADLs, coordination, tone, ROM, strength, pain, fascial restrictions, muscle spasms, and UE functional use  IMPAIRMENTS: are limiting patient from ADLs, IADLs, rest and sleep, and leisure.   COMORBIDITIES: may have co-morbidities  that affects occupational performance. Patient will benefit from skilled OT to address above impairments and improve overall function.  MODIFICATION OR ASSISTANCE TO COMPLETE EVALUATION: No modification of tasks or assist necessary to complete an evaluation.  OT OCCUPATIONAL PROFILE AND HISTORY: Problem focused assessment: Including review of records relating to presenting problem.  CLINICAL DECISION MAKING: LOW - limited treatment options, no task modification necessary  REHAB POTENTIAL: Good  EVALUATION COMPLEXITY: Low      PLAN:  OT FREQUENCY: 2x/week  OT DURATION: 6 weeks  PLANNED INTERVENTIONS: self care/ADL training, therapeutic exercise, therapeutic activity, neuromuscular re-education, manual therapy, passive range of motion, splinting, electrical stimulation, ultrasound, moist heat, cryotherapy, patient/family education, and DME and/or AE instructions  RECOMMENDED OTHER SERVICES: None  CONSULTED AND AGREED WITH PLAN OF CARE: Patient  PLAN FOR NEXT SESSION: Follow up on HEP, initiate manual techniques, passive stretching, AA/ROM, scapular ROM tasks   Lurena Joiner, OTR/L  [phone] 04/10/2023, 10:22 AM

## 2023-04-22 ENCOUNTER — Ambulatory Visit (HOSPITAL_COMMUNITY): Payer: 59 | Admitting: Occupational Therapy

## 2023-04-22 ENCOUNTER — Encounter (HOSPITAL_COMMUNITY): Payer: Self-pay | Admitting: Occupational Therapy

## 2023-04-22 DIAGNOSIS — M25512 Pain in left shoulder: Secondary | ICD-10-CM

## 2023-04-22 DIAGNOSIS — R2681 Unsteadiness on feet: Secondary | ICD-10-CM | POA: Diagnosis not present

## 2023-04-22 DIAGNOSIS — M25511 Pain in right shoulder: Secondary | ICD-10-CM | POA: Diagnosis not present

## 2023-04-22 DIAGNOSIS — R29898 Other symptoms and signs involving the musculoskeletal system: Secondary | ICD-10-CM

## 2023-04-22 DIAGNOSIS — G8929 Other chronic pain: Secondary | ICD-10-CM | POA: Diagnosis not present

## 2023-04-22 DIAGNOSIS — R278 Other lack of coordination: Secondary | ICD-10-CM | POA: Diagnosis not present

## 2023-04-22 NOTE — Therapy (Signed)
OUTPATIENT OCCUPATIONAL THERAPY ORTHO TREATMENT  Patient Name: Allison Gould MRN: [id number] DOB:1951/09/12, 72 y.o., female Today's Date: 04/22/2023   END OF SESSION:  OT End of Session - 04/22/23 1543     Visit Number 3    Number of Visits 12    Date for OT Re-Evaluation 05/21/23    Authorization Type [employer]    Authorization - Visit Number 2    Progress Note Due on Visit 10    OT Start Time 1532   time conflict, pt began late   OT Stop Time 1600    OT Time Calculation (min) 28 min    Activity Tolerance Patient tolerated treatment well    Behavior During Therapy WFL for tasks assessed/performed              Past Medical History:  Diagnosis Date   Anxiety    Diabetes mellitus type II    GERD (gastroesophageal reflux disease)    Hearing aid worn    right   Hypertension    Lipid disorder    Loss of hair    for unknown reason, to see MD    Low back pain    Morbid obesity with BMI of 45.0-49.9, adult (HCC)    Osteoarthritis    Past Surgical History:  Procedure Laterality Date   CATARACT EXTRACTION W/PHACO Left 05/12/2016   Procedure: CATARACT EXTRACTION PHACO AND INTRAOCULAR LENS PLACEMENT (IOC);  Surgeon: Gemma Payor, MD;  Location: [facility];  Service: Ophthalmology;  Laterality: Left;  CDE: 6.63   CATARACT EXTRACTION W/PHACO Right 06/16/2016   Procedure: CATARACT EXTRACTION PHACO AND INTRAOCULAR LENS PLACEMENT (IOC); CDE:  4.13;  Surgeon: Gemma Payor, MD;  Location: [facility];  Service: Ophthalmology;  Laterality: Right;   COLONOSCOPY  07/12/2012   Procedure: COLONOSCOPY;  Surgeon: West Bali, MD;  Location: [facility];  Service: Endoscopy;  Laterality: N/A;  1:00   lipoma-right shoulder     spur and nerve repair right shoulder     Patient Active Problem List   Diagnosis Date Noted   Hyperkalemia 09/17/2022   Hypernatremia 09/17/2022   Localized edema 07/23/2022   Paresthesia of hand 07/23/2022   Congestive heart failure (HCC) 07/12/2022   Chronic  obstructive lung disease (HCC) 01/27/2022   Morbid obesity (HCC) 01/27/2022   Edema of lower extremity 09/30/2021   Lupus erythematosus 09/25/2021   Chronic insomnia 08/21/2021   Chronic kidney disease 06/27/2021   Thrombocytopenic disorder (HCC) 06/27/2021   Gastroesophageal reflux disease 05/24/2021   Hyperlipidemia 05/24/2021   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   Leg weakness 04/14/2013   Back pain 04/14/2013   Knee pain 04/14/2013   Pain in joint, shoulder region 03/11/2013   Muscle weakness (generalized) 03/11/2013   Pain in joint, ankle and foot 03/11/2013   Abnormality of gait 03/11/2013   Right ankle sprain 03/02/2013   Shoulder contusion 03/02/2013   Constipation 06/22/2012   Colon cancer screening 06/22/2012   Pain in limb 07/05/2009   FLANK PAIN, RIGHT 04/04/2009   ELECTROCARDIOGRAM, ABNORMAL 02/20/2009   DIABETES MELLITUS, WITH NEUROLOGICAL COMPLICATIONS 01/09/2009   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 Dwana Melena REFERRING PROVIDER: Lupita Raider, NP  ONSET DATE: ~03/16/23  REFERRING DIAG: left shoulder pain  THERAPY DIAG:  Acute pain of left shoulder  Other symptoms and signs involving  the musculoskeletal system  Rationale for Evaluation and Treatment: Rehabilitation  SUBJECTIVE:   SUBJECTIVE STATEMENT: S: I sit on the side of the bed and do my exercises.   PERTINENT HISTORY: Pt is a 72 y/o female presenting with left shoulder pain present for approximately 3 weeks. Pt goes to the chiropractor and reports no improvement. Pt reports she does not remember a precipitating event.   PRECAUTIONS: None  WEIGHT BEARING RESTRICTIONS: No  PAIN:  Are you having pain? Yes: NPRS scale: 2/10 Pain location: left shoulder Pain description: sore, aching Aggravating  factors: certain movements Relieving factors: rest, Tylenol, pain cream   FALLS: Has patient fallen in last 6 months? No  PLOF: Independent  PATIENT GOALS: To have less pin in the left arm.   NEXT MD VISIT: unsure  OBJECTIVE:   HAND DOMINANCE: Right  ADLs: Overall ADLs: Pt reports difficulty with reaching overhead, across body, and behind back. Pt has difficulty with dressing-specifically putting on a bra, bathing, fixing her hair, completing housework like pulling trash out of the can. Wakes up due to pain at night. Getting in and out of the tub is also difficult.   FUNCTIONAL OUTCOME MEASURES: FOTO: 51/100  UPPER EXTREMITY ROM:       Assessed in supine, er/IR adducted  Passive ROM Left eval  Shoulder flexion 159  Shoulder abduction 158  Shoulder internal rotation 90  Shoulder external rotation 75  (Blank rows = not tested)    Assessed in sitting, er/IR adducted  Active ROM Left eval  Shoulder flexion 108  Shoulder abduction 81  Shoulder internal rotation 90  Shoulder external rotation 43  (Blank rows = not tested)    UPPER EXTREMITY MMT:     Assessed in sitting, er/IR adducted  MMT Left eval  Shoulder flexion 4-/5  Shoulder abduction 3-/5  Shoulder internal rotation 4/5  Shoulder external rotation 3+/5  (Blank rows = not tested)  HAND FUNCTION: Grip strength: Right: 60 lbs; Left: 35 lbs  COGNITION: Overall cognitive status: Within functional limits for tasks assessed  OBSERVATIONS: mod fascial restrictions along upper arm, anterior shoulder, and trapezius regions   TODAY'S TREATMENT:                                                                                                                              DATE:  04/22/23 -P/ROM: seated-flexion, abduction, er/IR, horizontal abduction, 5 reps -AA/ROM: seated-protraction, flexion, abduction, horizontal abduction, 10 reps -A/ROM: seated-er/IR, row, elevation/depression, extension, 10 reps -Pinch  tree: pt seated at the table, reaching and place 13 clothespins on vertical bar in flexion, removing in abduction   04/10/2023 -Manual: manual techniques to deltoid, pectoralis, trapezius (pt reports increased pain and tightness in trapezius and pectoralis)  -P/ROM: supine; flexion, abduction, internal/external rotation 7x  -AA/ROM: supine; flexion, abduction, internal/external rotation 7x  -Scapular ROM: elevation/depression, retraction, row 10x  -Shoulder stretch: walking arms up wall into 75% shoulder flexion, holding 10 seconds at top of  stretch 2x    PATIENT EDUCATION: Education details: reviewed HEP Person educated: Patient Education method: Explanation, Demonstration, and Handouts Education comprehension: verbalized understanding and returned demonstration  HOME EXERCISE PROGRAM: Eval: AA/ROM  GOALS: Goals reviewed with patient? Yes   SHORT TERM GOALS: Target date: 04/30/23  Pt will be provided with and educated on HEP to improve mobility in LUE required for use during ADL completion.   Goal status: IN PROGRESS   2.  Pt will increase LUE strength to 4-/5 to improve ability to reach for items at waist to chest height during bathing and grooming tasks.   Goal status: IN PROGRESS    LONG TERM GOALS: Target date: 04/21/23  Pt will decrease pain in LUE to 3/10 or less to improve ability to sleep for 2+ consecutive hours without waking due to pain.   Goal status: IN PROGRESS  2.  Pt will decrease LUE fascial restrictions to min amounts or less to improve mobility required for functional reaching tasks.   Goal status: IN PROGRESS  3.  Pt will increase LUE A/ROM by 30 degrees or greater to improve ability to use LUE when reaching overhead or behind back during dressing and bathing tasks.   Goal status: IN PROGRESS  4.  Pt will increase LUE strength to 4/5 or greater to improve ability to use LUE when lifting trash or carrying items during meal  preparation/housework/yardwork tasks.   Goal status: IN PROGRESS   ASSESSMENT:  CLINICAL IMPRESSION: Pt reports she is having some back pain, her shoulder is feeling a little better and HEP is going well. Completed passive stretching and AA/ROM today, intermittent rest breaks due to fatigue. Pinch tree completed, pt reports increased pain with higher reaching for pin placement, achieving ROM to approximately 60-65%. Verbal cuing for form and technique during tasks.   PERFORMANCE DEFICITS: in functional skills including in functional skills including ADLs, IADLs, coordination, tone, ROM, strength, pain, fascial restrictions, muscle spasms, and UE functional use    PLAN:  OT FREQUENCY: 2x/week  OT DURATION: 6 weeks  PLANNED INTERVENTIONS: self care/ADL training, therapeutic exercise, therapeutic activity, neuromuscular re-education, manual therapy, passive range of motion, splinting, electrical stimulation, ultrasound, moist heat, cryotherapy, patient/family education, and DME and/or AE instructions  CONSULTED AND AGREED WITH PLAN OF CARE: Patient  PLAN FOR NEXT SESSION: Follow up on HEP, continue manual techniques, passive stretching, AA/ROM, scapular ROM tasks   Ezra Sites, OTR/L  [phone] 04/22/2023, 4:50 PM

## 2023-04-24 ENCOUNTER — Ambulatory Visit (HOSPITAL_COMMUNITY): Payer: 59 | Admitting: Occupational Therapy

## 2023-04-24 ENCOUNTER — Encounter (HOSPITAL_COMMUNITY): Payer: Self-pay | Admitting: Occupational Therapy

## 2023-04-24 DIAGNOSIS — M25511 Pain in right shoulder: Secondary | ICD-10-CM | POA: Diagnosis not present

## 2023-04-24 DIAGNOSIS — R29898 Other symptoms and signs involving the musculoskeletal system: Secondary | ICD-10-CM | POA: Diagnosis not present

## 2023-04-24 DIAGNOSIS — R2681 Unsteadiness on feet: Secondary | ICD-10-CM | POA: Diagnosis not present

## 2023-04-24 DIAGNOSIS — G8929 Other chronic pain: Secondary | ICD-10-CM | POA: Diagnosis not present

## 2023-04-24 DIAGNOSIS — M25512 Pain in left shoulder: Secondary | ICD-10-CM | POA: Diagnosis not present

## 2023-04-24 DIAGNOSIS — R278 Other lack of coordination: Secondary | ICD-10-CM | POA: Diagnosis not present

## 2023-04-24 NOTE — Therapy (Signed)
OUTPATIENT OCCUPATIONAL THERAPY ORTHO TREATMENT  Patient Name: Allison Gould MRN: [id number] DOB:September 24, 1951, 72 y.o., female Today's Date: 04/24/2023   END OF SESSION:  OT End of Session - 04/24/23 1159     Visit Number 4    Number of Visits 12    Date for OT Re-Evaluation 05/21/23    Authorization Type [employer]    Authorization - Visit Number 3    OT Start Time 1115    OT Stop Time 1156    OT Time Calculation (min) 41 min    Activity Tolerance Patient tolerated treatment well    Behavior During Therapy WFL for tasks assessed/performed               Past Medical History:  Diagnosis Date   Anxiety    Diabetes mellitus type II    GERD (gastroesophageal reflux disease)    Hearing aid worn    right   Hypertension    Lipid disorder    Loss of hair    for unknown reason, to see MD    Low back pain    Morbid obesity with BMI of 45.0-49.9, adult (HCC)    Osteoarthritis    Past Surgical History:  Procedure Laterality Date   CATARACT EXTRACTION W/PHACO Left 05/12/2016   Procedure: CATARACT EXTRACTION PHACO AND INTRAOCULAR LENS PLACEMENT (IOC);  Surgeon: Gemma Payor, MD;  Location: [facility];  Service: Ophthalmology;  Laterality: Left;  CDE: 6.63   CATARACT EXTRACTION W/PHACO Right 06/16/2016   Procedure: CATARACT EXTRACTION PHACO AND INTRAOCULAR LENS PLACEMENT (IOC); CDE:  4.13;  Surgeon: Gemma Payor, MD;  Location: [facility];  Service: Ophthalmology;  Laterality: Right;   COLONOSCOPY  07/12/2012   Procedure: COLONOSCOPY;  Surgeon: West Bali, MD;  Location: [facility];  Service: Endoscopy;  Laterality: N/A;  1:00   lipoma-right shoulder     spur and nerve repair right shoulder     Patient Active Problem List   Diagnosis Date Noted   Hyperkalemia 09/17/2022   Hypernatremia 09/17/2022   Localized edema 07/23/2022   Paresthesia of hand 07/23/2022   Congestive heart failure (HCC) 07/12/2022   Chronic obstructive lung disease (HCC) 01/27/2022   Morbid obesity (HCC)  01/27/2022   Edema of lower extremity 09/30/2021   Lupus erythematosus 09/25/2021   Chronic insomnia 08/21/2021   Chronic kidney disease 06/27/2021   Thrombocytopenic disorder (HCC) 06/27/2021   Gastroesophageal reflux disease 05/24/2021   Hyperlipidemia 05/24/2021   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   Leg weakness 04/14/2013   Back pain 04/14/2013   Knee pain 04/14/2013   Pain in joint, shoulder region 03/11/2013   Muscle weakness (generalized) 03/11/2013   Pain in joint, ankle and foot 03/11/2013   Abnormality of gait 03/11/2013   Right ankle sprain 03/02/2013   Shoulder contusion 03/02/2013   Constipation 06/22/2012   Colon cancer screening 06/22/2012   Pain in limb 07/05/2009   FLANK PAIN, RIGHT 04/04/2009   ELECTROCARDIOGRAM, ABNORMAL 02/20/2009   DIABETES MELLITUS, WITH NEUROLOGICAL COMPLICATIONS 01/09/2009   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 Dwana Melena REFERRING PROVIDER: Lupita Raider, NP  ONSET DATE: ~03/16/23  REFERRING DIAG: left shoulder pain  THERAPY DIAG:  Acute pain of left shoulder  Other symptoms and signs involving the musculoskeletal system  Rationale for Evaluation and Treatment: Rehabilitation  SUBJECTIVE:  SUBJECTIVE STATEMENT: S: My back has been feeling better    PERTINENT HISTORY: Pt is a 72 y/o female presenting with left shoulder pain present for approximately 3 weeks. Pt goes to the chiropractor and reports no improvement. Pt reports she does not remember a precipitating event.   PRECAUTIONS: None  WEIGHT BEARING RESTRICTIONS: No  PAIN:  Are you having pain? Yes: NPRS scale: 3/10 Pain location: left shoulder Pain description: sore, aching Aggravating factors: certain movements Relieving factors: rest, Tylenol, pain cream    FALLS: Has patient fallen in last 6 months? No  PLOF: Independent  PATIENT GOALS: To have less pin in the left arm.   NEXT MD VISIT: unsure  OBJECTIVE:   HAND DOMINANCE: Right  ADLs: Overall ADLs: Pt reports difficulty with reaching overhead, across body, and behind back. Pt has difficulty with dressing-specifically putting on a bra, bathing, fixing her hair, completing housework like pulling trash out of the can. Wakes up due to pain at night. Getting in and out of the tub is also difficult.   FUNCTIONAL OUTCOME MEASURES: FOTO: 51/100  UPPER EXTREMITY ROM:       Assessed in supine, er/IR adducted  Passive ROM Left eval  Shoulder flexion 159  Shoulder abduction 158  Shoulder internal rotation 90  Shoulder external rotation 75  (Blank rows = not tested)    Assessed in sitting, er/IR adducted  Active ROM Left eval  Shoulder flexion 108  Shoulder abduction 81  Shoulder internal rotation 90  Shoulder external rotation 43  (Blank rows = not tested)    UPPER EXTREMITY MMT:     Assessed in sitting, er/IR adducted  MMT Left eval  Shoulder flexion 4-/5  Shoulder abduction 3-/5  Shoulder internal rotation 4/5  Shoulder external rotation 3+/5  (Blank rows = not tested)  HAND FUNCTION: Grip strength: Right: 60 lbs; Left: 35 lbs  COGNITION: Overall cognitive status: Within functional limits for tasks assessed  OBSERVATIONS: mod fascial restrictions along upper arm, anterior shoulder, and trapezius regions   TODAY'S TREATMENT:                                                                                                                              DATE:   04/24/23 -Manual: manual techniques to deltoid, pectoralis, trapezius (pt reports increased pain and tightness in trapezius and pectoralis)  -P/ROM: seated-flexion, abduction, er/IR, horizontal abduction, 5 reps -AA/ROM: seated-protraction, flexion, abduction, horizontal abduction, 10 reps -A/ROM:  seated-er/IR, row, elevation/depression, extension, 10 reps -Shoulder flexion: rolling orange ball up and down wall into flexion 5x  - Functional reach: placing 7 cones in cabinet while standing (60% shoulder flexion)   -Scapular strengthening: row, extension, retraction 10x with red theraband     04/22/23 -P/ROM: seated-flexion, abduction, er/IR, horizontal abduction, 5 reps -AA/ROM: seated-protraction, flexion, abduction, horizontal abduction, 10 reps -A/ROM: seated-er/IR, row, elevation/depression, extension, 10 reps -Pinch tree: pt seated at the table, reaching and place 13 clothespins on vertical bar  in flexion, removing in abduction   04/10/2023 -Manual: manual techniques to deltoid, pectoralis, trapezius (pt reports increased pain and tightness in trapezius and pectoralis)  -P/ROM: supine; flexion, abduction, internal/external rotation 7x  -AA/ROM: supine; flexion, abduction, internal/external rotation 7x  -Scapular ROM: elevation/depression, retraction, row 10x  -Shoulder stretch: walking arms up wall into 75% shoulder flexion, holding 10 seconds at top of stretch 2x    PATIENT EDUCATION: Education details: reviewed HEP Person educated: Patient Education method: Explanation, Demonstration, and Handouts Education comprehension: verbalized understanding and returned demonstration  HOME EXERCISE PROGRAM: Eval: AA/ROM  GOALS: Goals reviewed with patient? Yes   SHORT TERM GOALS: Target date: 04/30/23  Pt will be provided with and educated on HEP to improve mobility in LUE required for use during ADL completion.   Goal status: IN PROGRESS   2.  Pt will increase LUE strength to 4-/5 to improve ability to reach for items at waist to chest height during bathing and grooming tasks.   Goal status: IN PROGRESS    LONG TERM GOALS: Target date: 04/21/23  Pt will decrease pain in LUE to 3/10 or less to improve ability to sleep for 2+ consecutive hours without waking due to pain.    Goal status: IN PROGRESS  2.  Pt will decrease LUE fascial restrictions to min amounts or less to improve mobility required for functional reaching tasks.   Goal status: IN PROGRESS  3.  Pt will increase LUE A/ROM by 30 degrees or greater to improve ability to use LUE when reaching overhead or behind back during dressing and bathing tasks.   Goal status: IN PROGRESS  4.  Pt will increase LUE strength to 4/5 or greater to improve ability to use LUE when lifting trash or carrying items during meal preparation/housework/yardwork tasks.   Goal status: IN PROGRESS   ASSESSMENT:  CLINICAL IMPRESSION: Pt stated she has been experiencing intense back pain but it has gotten a little better today. Completed all ROM sitting EOB. Continued with ROM and functional reach activities. Added in scapular strengthening exercises with red theraband- completed in sitting position due to back pain. Pt required frequent rest breaks throughout session due to fatigue.   PERFORMANCE DEFICITS: in functional skills including in functional skills including ADLs, IADLs, coordination, tone, ROM, strength, pain, fascial restrictions, muscle spasms, and UE functional use    PLAN:  OT FREQUENCY: 2x/week  OT DURATION: 6 weeks  PLANNED INTERVENTIONS: self care/ADL training, therapeutic exercise, therapeutic activity, neuromuscular re-education, manual therapy, passive range of motion, splinting, electrical stimulation, ultrasound, moist heat, cryotherapy, patient/family education, and DME and/or AE instructions  CONSULTED AND AGREED WITH PLAN OF CARE: Patient  PLAN FOR NEXT SESSION: Follow up on HEP, continue manual techniques, passive stretching, AA/ROM, scapular ROM tasks   Lurena Joiner, OTR/L  [phone] 04/24/2023, 12:00 PM

## 2023-04-28 ENCOUNTER — Encounter (HOSPITAL_COMMUNITY): Payer: Self-pay | Admitting: Occupational Therapy

## 2023-04-28 ENCOUNTER — Ambulatory Visit (HOSPITAL_COMMUNITY): Payer: 59 | Admitting: Occupational Therapy

## 2023-04-28 DIAGNOSIS — R29898 Other symptoms and signs involving the musculoskeletal system: Secondary | ICD-10-CM | POA: Diagnosis not present

## 2023-04-28 DIAGNOSIS — R2681 Unsteadiness on feet: Secondary | ICD-10-CM | POA: Diagnosis not present

## 2023-04-28 DIAGNOSIS — M545 Low back pain, unspecified: Secondary | ICD-10-CM | POA: Diagnosis not present

## 2023-04-28 DIAGNOSIS — M25512 Pain in left shoulder: Secondary | ICD-10-CM | POA: Diagnosis not present

## 2023-04-28 DIAGNOSIS — R278 Other lack of coordination: Secondary | ICD-10-CM | POA: Diagnosis not present

## 2023-04-28 DIAGNOSIS — N189 Chronic kidney disease, unspecified: Secondary | ICD-10-CM | POA: Diagnosis not present

## 2023-04-28 DIAGNOSIS — M792 Neuralgia and neuritis, unspecified: Secondary | ICD-10-CM | POA: Diagnosis not present

## 2023-04-28 DIAGNOSIS — G8929 Other chronic pain: Secondary | ICD-10-CM | POA: Diagnosis not present

## 2023-04-28 DIAGNOSIS — M25511 Pain in right shoulder: Secondary | ICD-10-CM | POA: Diagnosis not present

## 2023-04-28 NOTE — Therapy (Signed)
OUTPATIENT OCCUPATIONAL THERAPY ORTHO TREATMENT  Patient Name: Allison Gould MRN: [id number] DOB:September 25, 1951, 72 y.o., female Today's Date: 04/28/2023   END OF SESSION:  OT End of Session - 04/28/23 2027     Visit Number 5    Number of Visits 12    Date for OT Re-Evaluation 05/21/23    Authorization Type [employer] Medicare    OT Start Time 1444    OT Stop Time 1524    OT Time Calculation (min) 40 min    Activity Tolerance Patient tolerated treatment well    Behavior During Therapy WFL for tasks assessed/performed             Past Medical History:  Diagnosis Date   Anxiety    Diabetes mellitus type II    GERD (gastroesophageal reflux disease)    Hearing aid worn    right   Hypertension    Lipid disorder    Loss of hair    for unknown reason, to see MD    Low back pain    Morbid obesity with BMI of 45.0-49.9, adult (HCC)    Osteoarthritis    Past Surgical History:  Procedure Laterality Date   CATARACT EXTRACTION W/PHACO Left 05/12/2016   Procedure: CATARACT EXTRACTION PHACO AND INTRAOCULAR LENS PLACEMENT (IOC);  Surgeon: Gemma Payor, MD;  Location: [facility];  Service: Ophthalmology;  Laterality: Left;  CDE: 6.63   CATARACT EXTRACTION W/PHACO Right 06/16/2016   Procedure: CATARACT EXTRACTION PHACO AND INTRAOCULAR LENS PLACEMENT (IOC); CDE:  4.13;  Surgeon: Gemma Payor, MD;  Location: [facility];  Service: Ophthalmology;  Laterality: Right;   COLONOSCOPY  07/12/2012   Procedure: COLONOSCOPY;  Surgeon: West Bali, MD;  Location: [facility];  Service: Endoscopy;  Laterality: N/A;  1:00   lipoma-right shoulder     spur and nerve repair right shoulder     Patient Active Problem List   Diagnosis Date Noted   Hyperkalemia 09/17/2022   Hypernatremia 09/17/2022   Localized edema 07/23/2022   Paresthesia of hand 07/23/2022   Congestive heart failure (HCC) 07/12/2022   Chronic obstructive lung disease (HCC) 01/27/2022   Morbid obesity (HCC) 01/27/2022   Edema of lower extremity  09/30/2021   Lupus erythematosus 09/25/2021   Chronic insomnia 08/21/2021   Chronic kidney disease 06/27/2021   Thrombocytopenic disorder (HCC) 06/27/2021   Gastroesophageal reflux disease 05/24/2021   Hyperlipidemia 05/24/2021   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   Leg weakness 04/14/2013   Back pain 04/14/2013   Knee pain 04/14/2013   Pain in joint, shoulder region 03/11/2013   Muscle weakness (generalized) 03/11/2013   Pain in joint, ankle and foot 03/11/2013   Abnormality of gait 03/11/2013   Right ankle sprain 03/02/2013   Shoulder contusion 03/02/2013   Constipation 06/22/2012   Colon cancer screening 06/22/2012   Pain in limb 07/05/2009   FLANK PAIN, RIGHT 04/04/2009   ELECTROCARDIOGRAM, ABNORMAL 02/20/2009   DIABETES MELLITUS, WITH NEUROLOGICAL COMPLICATIONS 01/09/2009   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 Dwana Melena REFERRING PROVIDER: Lupita Raider, NP  ONSET DATE: ~03/16/23  REFERRING DIAG: left shoulder pain  THERAPY DIAG:  Acute pain of left shoulder  Other symptoms and signs involving the musculoskeletal system  Rationale for Evaluation and Treatment: Rehabilitation  SUBJECTIVE:   SUBJECTIVE STATEMENT: S: Sometimes it happens where I can't move  my arm.     PERTINENT HISTORY: Pt is a 71 y/o female presenting with left shoulder pain present for approximately 3 weeks. Pt goes to the chiropractor and reports no improvement. Pt reports she does not remember a precipitating event.   PRECAUTIONS: None  WEIGHT BEARING RESTRICTIONS: No  PAIN:  Are you having pain? Yes: NPRS scale: 2/10 Pain location: left shoulder Pain description: sore, aching Aggravating factors: certain movements Relieving factors: rest, Tylenol, pain cream   FALLS: Has patient fallen  in last 6 months? No  PLOF: Independent  PATIENT GOALS: To have less pin in the left arm.   NEXT MD VISIT: unsure  OBJECTIVE:   HAND DOMINANCE: Right  ADLs: Overall ADLs: Pt reports difficulty with reaching overhead, across body, and behind back. Pt has difficulty with dressing-specifically putting on a bra, bathing, fixing her hair, completing housework like pulling trash out of the can. Wakes up due to pain at night. Getting in and out of the tub is also difficult.   FUNCTIONAL OUTCOME MEASURES: FOTO: 51/100  UPPER EXTREMITY ROM:       Assessed in supine, er/IR adducted  Passive ROM Left eval  Shoulder flexion 159  Shoulder abduction 158  Shoulder internal rotation 90  Shoulder external rotation 75  (Blank rows = not tested)    Assessed in sitting, er/IR adducted  Active ROM Left eval  Shoulder flexion 108  Shoulder abduction 81  Shoulder internal rotation 90  Shoulder external rotation 43  (Blank rows = not tested)    UPPER EXTREMITY MMT:     Assessed in sitting, er/IR adducted  MMT Left eval  Shoulder flexion 4-/5  Shoulder abduction 3-/5  Shoulder internal rotation 4/5  Shoulder external rotation 3+/5  (Blank rows = not tested)  HAND FUNCTION: Grip strength: Right: 60 lbs; Left: 35 lbs  COGNITION: Overall cognitive status: Within functional limits for tasks assessed  OBSERVATIONS: mod fascial restrictions along upper arm, anterior shoulder, and trapezius regions   TODAY'S TREATMENT:                                                                                                                              DATE:   04/28/23 -Manual Therapy: myofascial release and trigger point applied to the biceps, deltoid, anterior shoulder girdle, scapular region, and trapezius to reduce pain and fascial restrictions in order to improve ROM. -P/ROM: supine, flexion, abduction, horizontal abduction, er/IR, x10 -AA/ROM: supine, flexion, abduction, protraction,  horizontal abduction, er/IR, x10 -Pulleys: flexion, abduction, x60" each -UBE: level 1, 3.5+ KPH, 2.5' forwards and backwards  04/24/23 -Manual: manual techniques to deltoid, pectoralis, trapezius (pt reports increased pain and tightness in trapezius and pectoralis)  -P/ROM: seated-flexion, abduction, er/IR, horizontal abduction, 5 reps -AA/ROM: seated-protraction, flexion, abduction, horizontal abduction, 10 reps -A/ROM: seated-er/IR, row, elevation/depression, extension, 10 reps -Shoulder flexion: rolling orange ball up and down wall into flexion 5x  - Functional reach: placing 7 cones in cabinet while standing (  60% shoulder flexion)   -Scapular strengthening: row, extension, retraction 10x with red theraband   04/22/23 -P/ROM: seated-flexion, abduction, er/IR, horizontal abduction, 5 reps -AA/ROM: seated-protraction, flexion, abduction, horizontal abduction, 10 reps -A/ROM: seated-er/IR, row, elevation/depression, extension, 10 reps -Pinch tree: pt seated at the table, reaching and place 13 clothespins on vertical bar in flexion, removing in abduction   PATIENT EDUCATION: Education details: Continue HEP Person educated: Patient Education method: Explanation, Demonstration, and Handouts Education comprehension: verbalized understanding and returned demonstration  HOME EXERCISE PROGRAM: Eval: AA/ROM  GOALS: Goals reviewed with patient? Yes   SHORT TERM GOALS: Target date: 04/30/23  Pt will be provided with and educated on HEP to improve mobility in LUE required for use during ADL completion.   Goal status: IN PROGRESS   2.  Pt will increase LUE strength to 4-/5 to improve ability to reach for items at waist to chest height during bathing and grooming tasks.   Goal status: IN PROGRESS    LONG TERM GOALS: Target date: 04/21/23  Pt will decrease pain in LUE to 3/10 or less to improve ability to sleep for 2+ consecutive hours without waking due to pain.   Goal status: IN  PROGRESS  2.  Pt will decrease LUE fascial restrictions to min amounts or less to improve mobility required for functional reaching tasks.   Goal status: IN PROGRESS  3.  Pt will increase LUE A/ROM by 30 degrees or greater to improve ability to use LUE when reaching overhead or behind back during dressing and bathing tasks.   Goal status: IN PROGRESS  4.  Pt will increase LUE strength to 4/5 or greater to improve ability to use LUE when lifting trash or carrying items during meal preparation/housework/yardwork tasks.   Goal status: IN PROGRESS   ASSESSMENT:  CLINICAL IMPRESSION: This session after manual therapy pt attempted AA/ROM, she was unable to lift her arm. OT then started with P/ROM, where she was able to achieve 85% of full P/ROM. Pt then was able to complete AA/ROM with 60% of full ROM. With Pulleys she was able to achieve 85% of full ROM as well. OT providing assist throughout session as well as verbal and visual cuing for positioning and technique.   PERFORMANCE DEFICITS: in functional skills including in functional skills including ADLs, IADLs, coordination, tone, ROM, strength, pain, fascial restrictions, muscle spasms, and UE functional use    PLAN:  OT FREQUENCY: 2x/week  OT DURATION: 6 weeks  PLANNED INTERVENTIONS: self care/ADL training, therapeutic exercise, therapeutic activity, neuromuscular re-education, manual therapy, passive range of motion, splinting, electrical stimulation, ultrasound, moist heat, cryotherapy, patient/family education, and DME and/or AE instructions  CONSULTED AND AGREED WITH PLAN OF CARE: Patient  PLAN FOR NEXT SESSION: Follow up on HEP, continue manual techniques, passive stretching, AA/ROM, scapular ROM tasks   Trish Mage, OTR/L [phone] 04/28/2023, 8:29 PM

## 2023-04-30 ENCOUNTER — Ambulatory Visit (HOSPITAL_COMMUNITY): Payer: 59 | Admitting: Occupational Therapy

## 2023-04-30 DIAGNOSIS — R278 Other lack of coordination: Secondary | ICD-10-CM | POA: Diagnosis not present

## 2023-04-30 DIAGNOSIS — R29898 Other symptoms and signs involving the musculoskeletal system: Secondary | ICD-10-CM

## 2023-04-30 DIAGNOSIS — M25512 Pain in left shoulder: Secondary | ICD-10-CM | POA: Diagnosis not present

## 2023-04-30 DIAGNOSIS — R2681 Unsteadiness on feet: Secondary | ICD-10-CM | POA: Diagnosis not present

## 2023-04-30 DIAGNOSIS — M25511 Pain in right shoulder: Secondary | ICD-10-CM | POA: Diagnosis not present

## 2023-04-30 DIAGNOSIS — G8929 Other chronic pain: Secondary | ICD-10-CM | POA: Diagnosis not present

## 2023-04-30 NOTE — Patient Instructions (Signed)

## 2023-04-30 NOTE — Therapy (Signed)
OUTPATIENT OCCUPATIONAL THERAPY ORTHO TREATMENT  Patient Name: Allison Gould MRN: [id number] DOB:Jan 16, 1951, 72 y.o., female Today's Date: 05/01/2023   END OF SESSION:  OT End of Session - 04/30/23 1430     Visit Number 6    Number of Visits 12    Date for OT Re-Evaluation 05/21/23    Authorization Type [employer]    OT Start Time 1348    OT Stop Time 1427    OT Time Calculation (min) 39 min    Activity Tolerance Patient tolerated treatment well    Behavior During Therapy WFL for tasks assessed/performed              Past Medical History:  Diagnosis Date   Anxiety    Diabetes mellitus type II    GERD (gastroesophageal reflux disease)    Hearing aid worn    right   Hypertension    Lipid disorder    Loss of hair    for unknown reason, to see MD    Low back pain    Morbid obesity with BMI of 45.0-49.9, adult (HCC)    Osteoarthritis    Past Surgical History:  Procedure Laterality Date   CATARACT EXTRACTION W/PHACO Left 05/12/2016   Procedure: CATARACT EXTRACTION PHACO AND INTRAOCULAR LENS PLACEMENT (IOC);  Surgeon: Gemma Payor, MD;  Location: [facility];  Service: Ophthalmology;  Laterality: Left;  CDE: 6.63   CATARACT EXTRACTION W/PHACO Right 06/16/2016   Procedure: CATARACT EXTRACTION PHACO AND INTRAOCULAR LENS PLACEMENT (IOC); CDE:  4.13;  Surgeon: Gemma Payor, MD;  Location: [facility];  Service: Ophthalmology;  Laterality: Right;   COLONOSCOPY  07/12/2012   Procedure: COLONOSCOPY;  Surgeon: West Bali, MD;  Location: [facility];  Service: Endoscopy;  Laterality: N/A;  1:00   lipoma-right shoulder     spur and nerve repair right shoulder     Patient Active Problem List   Diagnosis Date Noted   Hyperkalemia 09/17/2022   Hypernatremia 09/17/2022   Localized edema 07/23/2022   Paresthesia of hand 07/23/2022   Congestive heart failure (HCC) 07/12/2022   Chronic obstructive lung disease (HCC) 01/27/2022   Morbid obesity (HCC) 01/27/2022   Edema of lower extremity  09/30/2021   Lupus erythematosus 09/25/2021   Chronic insomnia 08/21/2021   Chronic kidney disease 06/27/2021   Thrombocytopenic disorder (HCC) 06/27/2021   Gastroesophageal reflux disease 05/24/2021   Hyperlipidemia 05/24/2021   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   Leg weakness 04/14/2013   Back pain 04/14/2013   Knee pain 04/14/2013   Pain in joint, shoulder region 03/11/2013   Muscle weakness (generalized) 03/11/2013   Pain in joint, ankle and foot 03/11/2013   Abnormality of gait 03/11/2013   Right ankle sprain 03/02/2013   Shoulder contusion 03/02/2013   Constipation 06/22/2012   Colon cancer screening 06/22/2012   Pain in limb 07/05/2009   FLANK PAIN, RIGHT 04/04/2009   ELECTROCARDIOGRAM, ABNORMAL 02/20/2009   DIABETES MELLITUS, WITH NEUROLOGICAL COMPLICATIONS 01/09/2009   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 Dwana Melena REFERRING PROVIDER: Lupita Raider, NP  ONSET DATE: ~03/16/23  REFERRING DIAG: left shoulder pain  THERAPY DIAG:  Acute pain of left shoulder  Other symptoms and signs involving the musculoskeletal system  Rationale for Evaluation and Treatment: Rehabilitation  SUBJECTIVE:   SUBJECTIVE STATEMENT: S: I'm feeling a bit better today  PERTINENT HISTORY: Pt is a 72 y/o female presenting with left shoulder pain present for approximately 3 weeks. Pt goes to the chiropractor and reports no improvement. Pt reports she does not remember a precipitating event.   PRECAUTIONS: None  WEIGHT BEARING RESTRICTIONS: No  PAIN:  Are you having pain? Yes: NPRS scale: 2/10 Pain location: left shoulder Pain description: sore, aching Aggravating factors: certain movements Relieving factors: rest, Tylenol, pain cream   FALLS: Has patient fallen in last 6 months?  No  PLOF: Independent  PATIENT GOALS: To have less pin in the left arm.   NEXT MD VISIT: unsure  OBJECTIVE:   HAND DOMINANCE: Right  ADLs: Overall ADLs: Pt reports difficulty with reaching overhead, across body, and behind back. Pt has difficulty with dressing-specifically putting on a bra, bathing, fixing her hair, completing housework like pulling trash out of the can. Wakes up due to pain at night. Getting in and out of the tub is also difficult.   FUNCTIONAL OUTCOME MEASURES: FOTO: 51/100  UPPER EXTREMITY ROM:       Assessed in supine, er/IR adducted  Passive ROM Left eval  Shoulder flexion 159  Shoulder abduction 158  Shoulder internal rotation 90  Shoulder external rotation 75  (Blank rows = not tested)    Assessed in sitting, er/IR adducted  Active ROM Left eval  Shoulder flexion 108  Shoulder abduction 81  Shoulder internal rotation 90  Shoulder external rotation 43  (Blank rows = not tested)    UPPER EXTREMITY MMT:     Assessed in sitting, er/IR adducted  MMT Left eval  Shoulder flexion 4-/5  Shoulder abduction 3-/5  Shoulder internal rotation 4/5  Shoulder external rotation 3+/5  (Blank rows = not tested)  HAND FUNCTION: Grip strength: Right: 60 lbs; Left: 35 lbs  COGNITION: Overall cognitive status: Within functional limits for tasks assessed  OBSERVATIONS: mod fascial restrictions along upper arm, anterior shoulder, and trapezius regions   TODAY'S TREATMENT:                                                                                                                              DATE:   04/30/23 -Manual Therapy: myofascial release and trigger point applied to the biceps, deltoid, anterior shoulder girdle, scapular region, and trapezius to reduce pain and fascial restrictions in order to improve ROM. -AA/ROM: supine, flexion, abduction, protraction, horizontal abduction, er/IR, x10 -A/ROM: seated, flexion, abduction, protraction,  horizontal abduction, er/IR, x10 -x to V arms x10 -Proximal shoulder exercises: paddles, criss cross, circles both directions, x10  04/28/23 -Manual Therapy: myofascial release and trigger point applied to the biceps, deltoid, anterior shoulder girdle, scapular region, and trapezius to reduce pain and fascial restrictions in order to improve ROM. -P/ROM: supine, flexion, abduction, horizontal abduction, er/IR, x10 -AA/ROM: supine, flexion, abduction, protraction, horizontal abduction, er/IR, x10 -Pulleys: flexion, abduction, x60" each -UBE: level 1, 3.5+ KPH, 2.5' forwards and backwards  04/24/23 -Manual: manual techniques to deltoid,  pectoralis, trapezius (pt reports increased pain and tightness in trapezius and pectoralis)  -P/ROM: seated-flexion, abduction, er/IR, horizontal abduction, 5 reps -AA/ROM: seated-protraction, flexion, abduction, horizontal abduction, 10 reps -A/ROM: seated-er/IR, row, elevation/depression, extension, 10 reps -Shoulder flexion: rolling orange ball up and down wall into flexion 5x  - Functional reach: placing 7 cones in cabinet while standing (60% shoulder flexion)   -Scapular strengthening: row, extension, retraction 10x with red theraband    PATIENT EDUCATION: Education details: A/ROM Person educated: Patient Education method: Programmer, multimedia, Facilities manager, and Handouts Education comprehension: verbalized understanding and returned demonstration  HOME EXERCISE PROGRAM: Eval: AA/ROM 6/27: A/ROM  GOALS: Goals reviewed with patient? Yes   SHORT TERM GOALS: Target date: 04/30/23  Pt will be provided with and educated on HEP to improve mobility in LUE required for use during ADL completion.   Goal status: IN PROGRESS   2.  Pt will increase LUE strength to 4-/5 to improve ability to reach for items at waist to chest height during bathing and grooming tasks.   Goal status: IN PROGRESS    LONG TERM GOALS: Target date: 04/21/23  Pt will decrease pain  in LUE to 3/10 or less to improve ability to sleep for 2+ consecutive hours without waking due to pain.   Goal status: IN PROGRESS  2.  Pt will decrease LUE fascial restrictions to min amounts or less to improve mobility required for functional reaching tasks.   Goal status: IN PROGRESS  3.  Pt will increase LUE A/ROM by 30 degrees or greater to improve ability to use LUE when reaching overhead or behind back during dressing and bathing tasks.   Goal status: IN PROGRESS  4.  Pt will increase LUE strength to 4/5 or greater to improve ability to use LUE when lifting trash or carrying items during meal preparation/housework/yardwork tasks.   Goal status: IN PROGRESS   ASSESSMENT:  CLINICAL IMPRESSION: Pt demonstrated improved mobility this session compared to last session. She was able to achieve approximately 70% of full AA/ROM and 55% of full A/ROM this session. With repetitions she fatigued quickly and her ROM would slowly start decreasing. OT added X to V arms this session, as well as proximal shoulder exercises to address and work on her endurance, since her ROM was improved this session. Verbal and tactile cuing provided for positioning and technique.   PERFORMANCE DEFICITS: in functional skills including in functional skills including ADLs, IADLs, coordination, tone, ROM, strength, pain, fascial restrictions, muscle spasms, and UE functional use    PLAN:  OT FREQUENCY: 2x/week  OT DURATION: 6 weeks  PLANNED INTERVENTIONS: self care/ADL training, therapeutic exercise, therapeutic activity, neuromuscular re-education, manual therapy, passive range of motion, splinting, electrical stimulation, ultrasound, moist heat, cryotherapy, patient/family education, and DME and/or AE instructions  CONSULTED AND AGREED WITH PLAN OF CARE: Patient  PLAN FOR NEXT SESSION: Follow up on HEP, continue manual techniques, passive stretching, AA/ROM, A/ROM, start scap strengthening   Trish Mage,  OTR/L [phone] 05/01/2023, 7:13 AM

## 2023-05-04 DIAGNOSIS — M170 Bilateral primary osteoarthritis of knee: Secondary | ICD-10-CM | POA: Diagnosis not present

## 2023-05-04 DIAGNOSIS — R0683 Snoring: Secondary | ICD-10-CM | POA: Diagnosis not present

## 2023-05-04 DIAGNOSIS — E785 Hyperlipidemia, unspecified: Secondary | ICD-10-CM | POA: Diagnosis not present

## 2023-05-04 DIAGNOSIS — D696 Thrombocytopenia, unspecified: Secondary | ICD-10-CM | POA: Diagnosis not present

## 2023-05-04 DIAGNOSIS — I129 Hypertensive chronic kidney disease with stage 1 through stage 4 chronic kidney disease, or unspecified chronic kidney disease: Secondary | ICD-10-CM | POA: Diagnosis not present

## 2023-05-04 DIAGNOSIS — R600 Localized edema: Secondary | ICD-10-CM | POA: Diagnosis not present

## 2023-05-04 DIAGNOSIS — D631 Anemia in chronic kidney disease: Secondary | ICD-10-CM | POA: Diagnosis not present

## 2023-05-04 DIAGNOSIS — J449 Chronic obstructive pulmonary disease, unspecified: Secondary | ICD-10-CM | POA: Diagnosis not present

## 2023-05-04 DIAGNOSIS — N189 Chronic kidney disease, unspecified: Secondary | ICD-10-CM | POA: Diagnosis not present

## 2023-05-04 DIAGNOSIS — E870 Hyperosmolality and hypernatremia: Secondary | ICD-10-CM | POA: Diagnosis not present

## 2023-05-04 DIAGNOSIS — I10 Essential (primary) hypertension: Secondary | ICD-10-CM | POA: Diagnosis not present

## 2023-05-05 ENCOUNTER — Ambulatory Visit (HOSPITAL_COMMUNITY): Payer: 59 | Attending: Family Medicine | Admitting: Occupational Therapy

## 2023-05-05 ENCOUNTER — Encounter (HOSPITAL_COMMUNITY): Payer: Self-pay | Admitting: Occupational Therapy

## 2023-05-05 DIAGNOSIS — R29898 Other symptoms and signs involving the musculoskeletal system: Secondary | ICD-10-CM | POA: Insufficient documentation

## 2023-05-05 DIAGNOSIS — M25512 Pain in left shoulder: Secondary | ICD-10-CM | POA: Insufficient documentation

## 2023-05-05 NOTE — Therapy (Signed)
OUTPATIENT OCCUPATIONAL THERAPY ORTHO TREATMENT  Patient Name: Allison Gould MRN: [id number] DOB:December 18, 1950, 72 y.o., female Today's Date: 05/05/2023   END OF SESSION:  OT End of Session - 05/05/23 1202     Visit Number 7    Number of Visits 12    Date for OT Re-Evaluation 05/21/23    Authorization Type [employer] Medicare    OT Start Time 1118    OT Stop Time 1201    OT Time Calculation (min) 43 min    Activity Tolerance Patient tolerated treatment well    Behavior During Therapy WFL for tasks assessed/performed             Past Medical History:  Diagnosis Date   Anxiety    Diabetes mellitus type II    GERD (gastroesophageal reflux disease)    Hearing aid worn    right   Hypertension    Lipid disorder    Loss of hair    for unknown reason, to see MD    Low back pain    Morbid obesity with BMI of 45.0-49.9, adult (HCC)    Osteoarthritis    Past Surgical History:  Procedure Laterality Date   CATARACT EXTRACTION W/PHACO Left 05/12/2016   Procedure: CATARACT EXTRACTION PHACO AND INTRAOCULAR LENS PLACEMENT (IOC);  Surgeon: Gemma Payor, MD;  Location: [facility];  Service: Ophthalmology;  Laterality: Left;  CDE: 6.63   CATARACT EXTRACTION W/PHACO Right 06/16/2016   Procedure: CATARACT EXTRACTION PHACO AND INTRAOCULAR LENS PLACEMENT (IOC); CDE:  4.13;  Surgeon: Gemma Payor, MD;  Location: [facility];  Service: Ophthalmology;  Laterality: Right;   COLONOSCOPY  07/12/2012   Procedure: COLONOSCOPY;  Surgeon: West Bali, MD;  Location: [facility];  Service: Endoscopy;  Laterality: N/A;  1:00   lipoma-right shoulder     spur and nerve repair right shoulder     Patient Active Problem List   Diagnosis Date Noted   Hyperkalemia 09/17/2022   Hypernatremia 09/17/2022   Localized edema 07/23/2022   Paresthesia of hand 07/23/2022   Congestive heart failure (HCC) 07/12/2022   Chronic obstructive lung disease (HCC) 01/27/2022   Morbid obesity (HCC) 01/27/2022   Edema of lower extremity  09/30/2021   Lupus erythematosus 09/25/2021   Chronic insomnia 08/21/2021   Chronic kidney disease 06/27/2021   Thrombocytopenic disorder (HCC) 06/27/2021   Gastroesophageal reflux disease 05/24/2021   Hyperlipidemia 05/24/2021   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   Leg weakness 04/14/2013   Back pain 04/14/2013   Knee pain 04/14/2013   Pain in joint, shoulder region 03/11/2013   Muscle weakness (generalized) 03/11/2013   Pain in joint, ankle and foot 03/11/2013   Abnormality of gait 03/11/2013   Right ankle sprain 03/02/2013   Shoulder contusion 03/02/2013   Constipation 06/22/2012   Colon cancer screening 06/22/2012   Pain in limb 07/05/2009   FLANK PAIN, RIGHT 04/04/2009   ELECTROCARDIOGRAM, ABNORMAL 02/20/2009   DIABETES MELLITUS, WITH NEUROLOGICAL COMPLICATIONS 01/09/2009   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 Dwana Melena REFERRING PROVIDER: Lupita Raider, NP  ONSET DATE: ~03/16/23  REFERRING DIAG: left shoulder pain  THERAPY DIAG:  Acute pain of left shoulder  Other symptoms and signs involving the musculoskeletal system  Rationale for Evaluation and Treatment: Rehabilitation  SUBJECTIVE:   SUBJECTIVE STATEMENT: S: Last night I was so sore and  I couldn't move my arm   PERTINENT HISTORY: Pt is a 72 y/o female presenting with left shoulder pain present for approximately 3 weeks. Pt goes to the chiropractor and reports no improvement. Pt reports she does not remember a precipitating event.   PRECAUTIONS: None  WEIGHT BEARING RESTRICTIONS: No  PAIN:  Are you having pain? Yes: NPRS scale: 3/10 Pain location: left shoulder Pain description: sore, aching Aggravating factors: certain movements Relieving factors: rest, Tylenol, pain cream   FALLS: Has patient fallen  in last 6 months? No  PLOF: Independent  PATIENT GOALS: To have less pin in the left arm.   NEXT MD VISIT: unsure  OBJECTIVE:   HAND DOMINANCE: Right  ADLs: Overall ADLs: Pt reports difficulty with reaching overhead, across body, and behind back. Pt has difficulty with dressing-specifically putting on a bra, bathing, fixing her hair, completing housework like pulling trash out of the can. Wakes up due to pain at night. Getting in and out of the tub is also difficult.   FUNCTIONAL OUTCOME MEASURES: FOTO: 51/100  UPPER EXTREMITY ROM:       Assessed in supine, er/IR adducted  Passive ROM Left eval  Shoulder flexion 159  Shoulder abduction 158  Shoulder internal rotation 90  Shoulder external rotation 75  (Blank rows = not tested)    Assessed in sitting, er/IR adducted  Active ROM Left eval  Shoulder flexion 108  Shoulder abduction 81  Shoulder internal rotation 90  Shoulder external rotation 43  (Blank rows = not tested)    UPPER EXTREMITY MMT:     Assessed in sitting, er/IR adducted  MMT Left eval  Shoulder flexion 4-/5  Shoulder abduction 3-/5  Shoulder internal rotation 4/5  Shoulder external rotation 3+/5  (Blank rows = not tested)  HAND FUNCTION: Grip strength: Right: 60 lbs; Left: 35 lbs  COGNITION: Overall cognitive status: Within functional limits for tasks assessed  OBSERVATIONS: mod fascial restrictions along upper arm, anterior shoulder, and trapezius regions   TODAY'S TREATMENT:                                                                                                                              DATE:   05/05/23 -Manual Therapy: myofascial release and trigger point applied to the biceps, deltoid, anterior shoulder girdle, scapular region, and trapezius to reduce pain and fascial restrictions in order to improve ROM. -A/ROM: seated, flexion, abduction, protraction, horizontal abduction, er/IR, x10 -Wall Slides: flexion, abduction,  x10 -Scapular strengthening: red band, extension, retraction, rows, x10 -UBE: level 1, 3.0+ KPH, 3' forwards and backwards  04/30/23 -Manual Therapy: myofascial release and trigger point applied to the biceps, deltoid, anterior shoulder girdle, scapular region, and trapezius to reduce pain and fascial restrictions in order to improve ROM. -AA/ROM: supine, flexion, abduction, protraction, horizontal abduction, er/IR, x10 -A/ROM: seated, flexion, abduction, protraction, horizontal abduction, er/IR, x10 -x to V arms x10 -Proximal shoulder exercises: paddles, criss cross, circles both directions, x10  04/28/23 -Manual Therapy: myofascial release and trigger point applied to the biceps, deltoid, anterior shoulder girdle, scapular region, and trapezius to reduce pain and fascial restrictions in order to improve ROM. -P/ROM: supine, flexion, abduction, horizontal abduction, er/IR, x10 -AA/ROM: supine, flexion, abduction, protraction, horizontal abduction, er/IR, x10 -Pulleys: flexion, abduction, x60" each -UBE: level 1, 3.5+ KPH, 2.5' forwards and backwards   PATIENT EDUCATION: Education details: Hospital doctor Person educated: Patient Education method: Explanation, Demonstration, and Handouts Education comprehension: verbalized understanding and returned demonstration  HOME EXERCISE PROGRAM: Eval: AA/ROM 6/27: A/ROM 7/2: Scapula Strengthening  GOALS: Goals reviewed with patient? Yes   SHORT TERM GOALS: Target date: 04/30/23  Pt will be provided with and educated on HEP to improve mobility in LUE required for use during ADL completion.   Goal status: IN PROGRESS   2.  Pt will increase LUE strength to 4-/5 to improve ability to reach for items at waist to chest height during bathing and grooming tasks.   Goal status: IN PROGRESS    LONG TERM GOALS: Target date: 04/21/23  Pt will decrease pain in LUE to 3/10 or less to improve ability to sleep for 2+ consecutive hours  without waking due to pain.   Goal status: IN PROGRESS  2.  Pt will decrease LUE fascial restrictions to min amounts or less to improve mobility required for functional reaching tasks.   Goal status: IN PROGRESS  3.  Pt will increase LUE A/ROM by 30 degrees or greater to improve ability to use LUE when reaching overhead or behind back during dressing and bathing tasks.   Goal status: IN PROGRESS  4.  Pt will increase LUE strength to 4/5 or greater to improve ability to use LUE when lifting trash or carrying items during meal preparation/housework/yardwork tasks.   Goal status: IN PROGRESS   ASSESSMENT:  CLINICAL IMPRESSION: This session pt presented with increased tenderness and fascial restrictions along her bicep, scapular region, and trapezius, addressed with manual therapy. Pt then completed some scapular strengthening with good form, however she fatigued quickly requiring a rest break during the exercises. Overall ROM is improving to approximately 70% of full A/ROM in supine and 65% in sitting. OT providing verbal and tactile cuing for positioning and technique.  PERFORMANCE DEFICITS: in functional skills including in functional skills including ADLs, IADLs, coordination, tone, ROM, strength, pain, fascial restrictions, muscle spasms, and UE functional use    PLAN:  OT FREQUENCY: 2x/week  OT DURATION: 6 weeks  PLANNED INTERVENTIONS: self care/ADL training, therapeutic exercise, therapeutic activity, neuromuscular re-education, manual therapy, passive range of motion, splinting, electrical stimulation, ultrasound, moist heat, cryotherapy, patient/family education, and DME and/or AE instructions  CONSULTED AND AGREED WITH PLAN OF CARE: Patient  PLAN FOR NEXT SESSION: Follow up on HEP, continue manual techniques, passive stretching, AA/ROM, A/ROM, start scap strengthening   Trish Mage, OTR/L [phone] 05/05/2023, 12:03 PM

## 2023-05-05 NOTE — Patient Instructions (Signed)

## 2023-05-08 ENCOUNTER — Encounter (HOSPITAL_COMMUNITY): Payer: 59 | Admitting: Occupational Therapy

## 2023-05-08 ENCOUNTER — Telehealth (HOSPITAL_COMMUNITY): Payer: Self-pay | Admitting: Occupational Therapy

## 2023-05-08 NOTE — Telephone Encounter (Signed)
Called pt regarding no-show for 05/08/23. Pt states she did not realize she had an appointment today. Reminded of next appt on 7/9 at 1:45.    Ezra Sites, OTR/L  [phone]

## 2023-05-12 ENCOUNTER — Encounter (HOSPITAL_COMMUNITY): Payer: Self-pay | Admitting: Occupational Therapy

## 2023-05-12 ENCOUNTER — Ambulatory Visit (HOSPITAL_COMMUNITY): Payer: 59 | Admitting: Occupational Therapy

## 2023-05-12 DIAGNOSIS — M25512 Pain in left shoulder: Secondary | ICD-10-CM

## 2023-05-12 DIAGNOSIS — R29898 Other symptoms and signs involving the musculoskeletal system: Secondary | ICD-10-CM | POA: Diagnosis not present

## 2023-05-12 NOTE — Therapy (Signed)
OUTPATIENT OCCUPATIONAL THERAPY ORTHO TREATMENT  Patient Name: Allison Gould MRN: [id number] DOB:1950/12/08, 72 y.o., female Today's Date: 05/12/2023   END OF SESSION:  OT End of Session - 05/12/23 1426     Visit Number 8    Number of Visits 12    Date for OT Re-Evaluation 05/21/23    Authorization Type [employer]    Progress Note Due on Visit 10    OT Start Time 1347    OT Stop Time 1433    OT Time Calculation (min) 46 min    Activity Tolerance Patient tolerated treatment well    Behavior During Therapy WFL for tasks assessed/performed              Past Medical History:  Diagnosis Date   Anxiety    Diabetes mellitus type II    GERD (gastroesophageal reflux disease)    Hearing aid worn    right   Hypertension    Lipid disorder    Loss of hair    for unknown reason, to see MD    Low back pain    Morbid obesity with BMI of 45.0-49.9, adult (HCC)    Osteoarthritis    Past Surgical History:  Procedure Laterality Date   CATARACT EXTRACTION W/PHACO Left 05/12/2016   Procedure: CATARACT EXTRACTION PHACO AND INTRAOCULAR LENS PLACEMENT (IOC);  Surgeon: Gemma Payor, MD;  Location: [facility];  Service: Ophthalmology;  Laterality: Left;  CDE: 6.63   CATARACT EXTRACTION W/PHACO Right 06/16/2016   Procedure: CATARACT EXTRACTION PHACO AND INTRAOCULAR LENS PLACEMENT (IOC); CDE:  4.13;  Surgeon: Gemma Payor, MD;  Location: [facility];  Service: Ophthalmology;  Laterality: Right;   COLONOSCOPY  07/12/2012   Procedure: COLONOSCOPY;  Surgeon: West Bali, MD;  Location: [facility];  Service: Endoscopy;  Laterality: N/A;  1:00   lipoma-right shoulder     spur and nerve repair right shoulder     Patient Active Problem List   Diagnosis Date Noted   Hyperkalemia 09/17/2022   Hypernatremia 09/17/2022   Localized edema 07/23/2022   Paresthesia of hand 07/23/2022   Congestive heart failure (HCC) 07/12/2022   Chronic obstructive lung disease (HCC) 01/27/2022   Morbid obesity (HCC)  01/27/2022   Edema of lower extremity 09/30/2021   Lupus erythematosus 09/25/2021   Chronic insomnia 08/21/2021   Chronic kidney disease 06/27/2021   Thrombocytopenic disorder (HCC) 06/27/2021   Gastroesophageal reflux disease 05/24/2021   Hyperlipidemia 05/24/2021   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   Leg weakness 04/14/2013   Back pain 04/14/2013   Knee pain 04/14/2013   Pain in joint, shoulder region 03/11/2013   Muscle weakness (generalized) 03/11/2013   Pain in joint, ankle and foot 03/11/2013   Abnormality of gait 03/11/2013   Right ankle sprain 03/02/2013   Shoulder contusion 03/02/2013   Constipation 06/22/2012   Colon cancer screening 06/22/2012   Pain in limb 07/05/2009   FLANK PAIN, RIGHT 04/04/2009   ELECTROCARDIOGRAM, ABNORMAL 02/20/2009   DIABETES MELLITUS, WITH NEUROLOGICAL COMPLICATIONS 01/09/2009   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 Dwana Melena REFERRING PROVIDER: Lupita Raider, NP  ONSET DATE: ~03/16/23  REFERRING DIAG: left shoulder pain  THERAPY DIAG:  Acute pain of left shoulder  Other symptoms and signs involving the musculoskeletal system  Rationale for Evaluation and Treatment: Rehabilitation  SUBJECTIVE:  SUBJECTIVE STATEMENT: S: Last night it had some pain, but it's not much right now.   PERTINENT HISTORY: Pt is a 72 y/o female presenting with left shoulder pain present for approximately 3 weeks. Pt goes to the chiropractor and reports no improvement. Pt reports she does not remember a precipitating event.   PRECAUTIONS: None  WEIGHT BEARING RESTRICTIONS: No  PAIN:  Are you having pain? Yes: NPRS scale: 4/10 Pain location: left shoulder Pain description: sore, aching Aggravating factors: certain movements Relieving factors: rest,  Tylenol, pain cream   FALLS: Has patient fallen in last 6 months? No  PLOF: Independent  PATIENT GOALS: To have less pin in the left arm.   NEXT MD VISIT: unsure  OBJECTIVE:   HAND DOMINANCE: Right  ADLs: Overall ADLs: Pt reports difficulty with reaching overhead, across body, and behind back. Pt has difficulty with dressing-specifically putting on a bra, bathing, fixing her hair, completing housework like pulling trash out of the can. Wakes up due to pain at night. Getting in and out of the tub is also difficult.   FUNCTIONAL OUTCOME MEASURES: FOTO: 51/100  UPPER EXTREMITY ROM:       Assessed in supine, er/IR adducted  Passive ROM Left eval  Shoulder flexion 159  Shoulder abduction 158  Shoulder internal rotation 90  Shoulder external rotation 75  (Blank rows = not tested)    Assessed in sitting, er/IR adducted  Active ROM Left eval  Shoulder flexion 108  Shoulder abduction 81  Shoulder internal rotation 90  Shoulder external rotation 43  (Blank rows = not tested)    UPPER EXTREMITY MMT:     Assessed in sitting, er/IR adducted  MMT Left eval  Shoulder flexion 4-/5  Shoulder abduction 3-/5  Shoulder internal rotation 4/5  Shoulder external rotation 3+/5  (Blank rows = not tested)  HAND FUNCTION: Grip strength: Right: 60 lbs; Left: 35 lbs   OBSERVATIONS: mod fascial restrictions along upper arm, anterior shoulder, and trapezius regions   TODAY'S TREATMENT:                                                                                                                              DATE:  05/12/23 -Manual Therapy: myofascial release and trigger point applied to the biceps, deltoid, anterior shoulder girdle, scapular region, and trapezius to reduce pain and fascial restrictions in order to improve ROM. -Scapular A/ROM: row, extension, 12 reps -A/ROM: seated-flexion, abduction, protraction, horizontal abduction, er/IR, x10 -Functional Reaching: pt  placing 10 cones on middle shelf of overhead cabinet in flexion, removing in abduction -X to V arms: 10 reps -Proximal shoulder strengthening: paddles, criss cross, circles each direction, 10 reps -ABC Writing: shoulder at 90 degrees, no weight, 3 rest breaks -Wall wash: 1' flexion -Overhead lacing: seated-lacing from top down then reversing -UBE: level 1-3' forward, 3' reverse, pace: 5.0  05/05/23 -Manual Therapy: myofascial release and trigger point applied to the biceps, deltoid, anterior shoulder girdle, scapular  region, and trapezius to reduce pain and fascial restrictions in order to improve ROM. -A/ROM: seated, flexion, abduction, protraction, horizontal abduction, er/IR, x10 -Wall Slides: flexion, abduction, x10 -Scapular strengthening: red band, extension, retraction, rows, x10 -UBE: level 1, 3.0+ KPH, 3' forwards and backwards  04/30/23 -Manual Therapy: myofascial release and trigger point applied to the biceps, deltoid, anterior shoulder girdle, scapular region, and trapezius to reduce pain and fascial restrictions in order to improve ROM. -AA/ROM: supine, flexion, abduction, protraction, horizontal abduction, er/IR, x10 -A/ROM: seated, flexion, abduction, protraction, horizontal abduction, er/IR, x10 -x to V arms x10 -Proximal shoulder exercises: paddles, criss cross, circles both directions, x10    PATIENT EDUCATION: Education details: continue A/ROM Person educated: Patient Education method: Programmer, multimedia, Facilities manager, and Handouts Education comprehension: verbalized understanding and returned demonstration  HOME EXERCISE PROGRAM: Eval: AA/ROM 6/27: A/ROM 7/2: Scapula Strengthening  GOALS: Goals reviewed with patient? Yes   SHORT TERM GOALS: Target date: 04/30/23  Pt will be provided with and educated on HEP to improve mobility in LUE required for use during ADL completion.   Goal status: IN PROGRESS   2.  Pt will increase LUE strength to 4-/5 to improve  ability to reach for items at waist to chest height during bathing and grooming tasks.   Goal status: IN PROGRESS    LONG TERM GOALS: Target date: 04/21/23  Pt will decrease pain in LUE to 3/10 or less to improve ability to sleep for 2+ consecutive hours without waking due to pain.   Goal status: IN PROGRESS  2.  Pt will decrease LUE fascial restrictions to min amounts or less to improve mobility required for functional reaching tasks.   Goal status: IN PROGRESS  3.  Pt will increase LUE A/ROM by 30 degrees or greater to improve ability to use LUE when reaching overhead or behind back during dressing and bathing tasks.   Goal status: IN PROGRESS  4.  Pt will increase LUE strength to 4/5 or greater to improve ability to use LUE when lifting trash or carrying items during meal preparation/housework/yardwork tasks.   Goal status: IN PROGRESS   ASSESSMENT:  CLINICAL IMPRESSION: Pt reports she is doing her exercises, feels her shoulder is improving slowly. Continued with manual techniques and A/ROM, adding proximal shoulder strengthening and stability work. Added ABC writing, overhead lacing, and functional reaching tasks, pt demonstrating A/ROM to ~65-75% today, fatigues quickly with sustained tasks. Verbal cuing for form and technique, rest breaks provided as needed.   PERFORMANCE DEFICITS: in functional skills including in functional skills including ADLs, IADLs, coordination, tone, ROM, strength, pain, fascial restrictions, muscle spasms, and UE functional use    PLAN:  OT FREQUENCY: 2x/week  OT DURATION: 6 weeks  PLANNED INTERVENTIONS: self care/ADL training, therapeutic exercise, therapeutic activity, neuromuscular re-education, manual therapy, passive range of motion, splinting, electrical stimulation, ultrasound, moist heat, cryotherapy, patient/family education, and DME and/or AE instructions  CONSULTED AND AGREED WITH PLAN OF CARE: Patient  PLAN FOR NEXT SESSION:  Follow up on HEP, continue manual techniques, passive stretching, AA/ROM, A/ROM, start scap strengthening   Ezra Sites, OTR/L  [phone] 05/12/2023, 2:33 PM

## 2023-05-14 ENCOUNTER — Encounter (HOSPITAL_COMMUNITY): Payer: 59 | Admitting: Occupational Therapy

## 2023-05-18 DIAGNOSIS — L669 Cicatricial alopecia, unspecified: Secondary | ICD-10-CM | POA: Diagnosis not present

## 2023-05-18 DIAGNOSIS — L089 Local infection of the skin and subcutaneous tissue, unspecified: Secondary | ICD-10-CM | POA: Diagnosis not present

## 2023-05-18 DIAGNOSIS — L930 Discoid lupus erythematosus: Secondary | ICD-10-CM | POA: Diagnosis not present

## 2023-05-18 DIAGNOSIS — L219 Seborrheic dermatitis, unspecified: Secondary | ICD-10-CM | POA: Diagnosis not present

## 2023-05-18 DIAGNOSIS — L668 Other cicatricial alopecia: Secondary | ICD-10-CM | POA: Diagnosis not present

## 2023-05-19 ENCOUNTER — Ambulatory Visit (HOSPITAL_COMMUNITY): Payer: 59 | Admitting: Occupational Therapy

## 2023-05-19 ENCOUNTER — Encounter (HOSPITAL_COMMUNITY): Payer: Self-pay | Admitting: Occupational Therapy

## 2023-05-19 DIAGNOSIS — R29898 Other symptoms and signs involving the musculoskeletal system: Secondary | ICD-10-CM

## 2023-05-19 DIAGNOSIS — M25512 Pain in left shoulder: Secondary | ICD-10-CM

## 2023-05-19 NOTE — Therapy (Signed)
OUTPATIENT OCCUPATIONAL THERAPY ORTHO TREATMENT  Patient Name: Allison Gould MRN: [id number] DOB:Nov 02, 1951, 72 y.o., female Today's Date: 05/19/2023   END OF SESSION:  OT End of Session - 05/19/23 1921     Visit Number 9    Number of Visits 12    Date for OT Re-Evaluation 05/21/23    Authorization Type [employer] Medicare    Progress Note Due on Visit 10    OT Start Time 1124    OT Stop Time 1205    OT Time Calculation (min) 41 min    Activity Tolerance Patient tolerated treatment well    Behavior During Therapy WFL for tasks assessed/performed              Past Medical History:  Diagnosis Date   Anxiety    Diabetes mellitus type II    GERD (gastroesophageal reflux disease)    Hearing aid worn    right   Hypertension    Lipid disorder    Loss of hair    for unknown reason, to see MD    Low back pain    Morbid obesity with BMI of 45.0-49.9, adult (HCC)    Osteoarthritis    Past Surgical History:  Procedure Laterality Date   CATARACT EXTRACTION W/PHACO Left 05/12/2016   Procedure: CATARACT EXTRACTION PHACO AND INTRAOCULAR LENS PLACEMENT (IOC);  Surgeon: Gemma Payor, MD;  Location: [facility];  Service: Ophthalmology;  Laterality: Left;  CDE: 6.63   CATARACT EXTRACTION W/PHACO Right 06/16/2016   Procedure: CATARACT EXTRACTION PHACO AND INTRAOCULAR LENS PLACEMENT (IOC); CDE:  4.13;  Surgeon: Gemma Payor, MD;  Location: [facility];  Service: Ophthalmology;  Laterality: Right;   COLONOSCOPY  07/12/2012   Procedure: COLONOSCOPY;  Surgeon: West Bali, MD;  Location: [facility];  Service: Endoscopy;  Laterality: N/A;  1:00   lipoma-right shoulder     spur and nerve repair right shoulder     Patient Active Problem List   Diagnosis Date Noted   Hyperkalemia 09/17/2022   Hypernatremia 09/17/2022   Localized edema 07/23/2022   Paresthesia of hand 07/23/2022   Congestive heart failure (HCC) 07/12/2022   Chronic obstructive lung disease (HCC) 01/27/2022   Morbid obesity (HCC)  01/27/2022   Edema of lower extremity 09/30/2021   Lupus erythematosus 09/25/2021   Chronic insomnia 08/21/2021   Chronic kidney disease 06/27/2021   Thrombocytopenic disorder (HCC) 06/27/2021   Gastroesophageal reflux disease 05/24/2021   Hyperlipidemia 05/24/2021   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   Leg weakness 04/14/2013   Back pain 04/14/2013   Knee pain 04/14/2013   Pain in joint, shoulder region 03/11/2013   Muscle weakness (generalized) 03/11/2013   Pain in joint, ankle and foot 03/11/2013   Abnormality of gait 03/11/2013   Right ankle sprain 03/02/2013   Shoulder contusion 03/02/2013   Constipation 06/22/2012   Colon cancer screening 06/22/2012   Pain in limb 07/05/2009   FLANK PAIN, RIGHT 04/04/2009   ELECTROCARDIOGRAM, ABNORMAL 02/20/2009   DIABETES MELLITUS, WITH NEUROLOGICAL COMPLICATIONS 01/09/2009   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 Dwana Melena REFERRING PROVIDER: Lupita Raider, NP  ONSET DATE: ~03/16/23  REFERRING DIAG: left shoulder pain  THERAPY DIAG:  Other symptoms and signs involving the musculoskeletal system  Acute pain of left shoulder  Rationale for Evaluation and Treatment: Rehabilitation  SUBJECTIVE:  SUBJECTIVE STATEMENT: S: I just don't understand why it is hurting so much  PERTINENT HISTORY: Pt is a 72 y/o female presenting with left shoulder pain present for approximately 3 weeks. Pt goes to the chiropractor and reports no improvement. Pt reports she does not remember a precipitating event.   PRECAUTIONS: None  WEIGHT BEARING RESTRICTIONS: No  PAIN:  Are you having pain? Yes: NPRS scale: 5/10 Pain location: left shoulder Pain description: sore, aching Aggravating factors: certain movements Relieving factors: rest, Tylenol, pain  cream   FALLS: Has patient fallen in last 6 months? No  PLOF: Independent  PATIENT GOALS: To have less pin in the left arm.   NEXT MD VISIT: unsure  OBJECTIVE:   HAND DOMINANCE: Right  ADLs: Overall ADLs: Pt reports difficulty with reaching overhead, across body, and behind back. Pt has difficulty with dressing-specifically putting on a bra, bathing, fixing her hair, completing housework like pulling trash out of the can. Wakes up due to pain at night. Getting in and out of the tub is also difficult.   FUNCTIONAL OUTCOME MEASURES: FOTO: 51/100  UPPER EXTREMITY ROM:       Assessed in supine, er/IR adducted  Passive ROM Left eval  Shoulder flexion 159  Shoulder abduction 158  Shoulder internal rotation 90  Shoulder external rotation 75  (Blank rows = not tested)    Assessed in sitting, er/IR adducted  Active ROM Left eval  Shoulder flexion 108  Shoulder abduction 81  Shoulder internal rotation 90  Shoulder external rotation 43  (Blank rows = not tested)    UPPER EXTREMITY MMT:     Assessed in sitting, er/IR adducted  MMT Left eval  Shoulder flexion 4-/5  Shoulder abduction 3-/5  Shoulder internal rotation 4/5  Shoulder external rotation 3+/5  (Blank rows = not tested)  HAND FUNCTION: Grip strength: Right: 60 lbs; Left: 35 lbs   OBSERVATIONS: mod fascial restrictions along upper arm, anterior shoulder, and trapezius regions   TODAY'S TREATMENT:                                                                                                                              DATE:   05/19/23 -Manual Therapy: myofascial release and trigger point applied to the biceps, deltoid, anterior shoulder girdle, scapular region, and trapezius to reduce pain and fascial restrictions in order to improve ROM. -A/ROM: seated-flexion, abduction, protraction, horizontal abduction, er/IR, x15 -Proximal shoulder strengthening: 1lb dumbbells, paddles, criss cross, circles each  direction, 10 reps -Scapular Strengthening: red bands, extension, retraction, rows, x10 -Shoulder Strengthening: red bands, horizontal abduction, er, IR, abduction, flexion, x10 -X to V arms: 1lb dumbbell, 10 reps -Goal Post Arms: 1lb dumbbell, x10   05/12/23 -Manual Therapy: myofascial release and trigger point applied to the biceps, deltoid, anterior shoulder girdle, scapular region, and trapezius to reduce pain and fascial restrictions in order to improve ROM. -Scapular A/ROM: row, extension, 12 reps -A/ROM: seated-flexion, abduction, protraction, horizontal abduction,  er/IR, x10 -Functional Reaching: pt placing 10 cones on middle shelf of overhead cabinet in flexion, removing in abduction -X to V arms: 10 reps -Proximal shoulder strengthening: paddles, criss cross, circles each direction, 10 reps -ABC Writing: shoulder at 90 degrees, no weight, 3 rest breaks -Wall wash: 1' flexion -Overhead lacing: seated-lacing from top down then reversing -UBE: level 1-3' forward, 3' reverse, pace: 5.0  05/05/23 -Manual Therapy: myofascial release and trigger point applied to the biceps, deltoid, anterior shoulder girdle, scapular region, and trapezius to reduce pain and fascial restrictions in order to improve ROM. -A/ROM: seated, flexion, abduction, protraction, horizontal abduction, er/IR, x10 -Wall Slides: flexion, abduction, x10 -Scapular strengthening: red band, extension, retraction, rows, x10 -UBE: level 1, 3.0+ KPH, 3' forwards and backwards   PATIENT EDUCATION: Education details: X to V arms and Goal Post arms Person educated: Patient Education method: Explanation, Demonstration, and Handouts Education comprehension: verbalized understanding and returned demonstration  HOME EXERCISE PROGRAM: Eval: AA/ROM 6/27: A/ROM 7/2: Scapula Strengthening 7/16: X to V arms and Goal Post arms  GOALS: Goals reviewed with patient? Yes   SHORT TERM GOALS: Target date: 04/30/23  Pt will be  provided with and educated on HEP to improve mobility in LUE required for use during ADL completion.   Goal status: IN PROGRESS   2.  Pt will increase LUE strength to 4-/5 to improve ability to reach for items at waist to chest height during bathing and grooming tasks.   Goal status: IN PROGRESS    LONG TERM GOALS: Target date: 04/21/23  Pt will decrease pain in LUE to 3/10 or less to improve ability to sleep for 2+ consecutive hours without waking due to pain.   Goal status: IN PROGRESS  2.  Pt will decrease LUE fascial restrictions to min amounts or less to improve mobility required for functional reaching tasks.   Goal status: IN PROGRESS  3.  Pt will increase LUE A/ROM by 30 degrees or greater to improve ability to use LUE when reaching overhead or behind back during dressing and bathing tasks.   Goal status: IN PROGRESS  4.  Pt will increase LUE strength to 4/5 or greater to improve ability to use LUE when lifting trash or carrying items during meal preparation/housework/yardwork tasks.   Goal status: IN PROGRESS   ASSESSMENT:  CLINICAL IMPRESSION: This session, pt continuing to fatigue quickly with all exercises, requiring frequent rest breaks. She continues to demonstrate approximately 75% of full ROM with incrementally improving strength, as she was able to tolerate adding 1lb dumbbells to most exercises this session. OT providing verbal and visual cuing throughout session for positioning and technique.  PERFORMANCE DEFICITS: in functional skills including in functional skills including ADLs, IADLs, coordination, tone, ROM, strength, pain, fascial restrictions, muscle spasms, and UE functional use    PLAN:  OT FREQUENCY: 2x/week  OT DURATION: 6 weeks  PLANNED INTERVENTIONS: self care/ADL training, therapeutic exercise, therapeutic activity, neuromuscular re-education, manual therapy, passive range of motion, splinting, electrical stimulation, ultrasound, moist  heat, cryotherapy, patient/family education, and DME and/or AE instructions  CONSULTED AND AGREED WITH PLAN OF CARE: Patient  PLAN FOR NEXT SESSION: Follow up on HEP, continue manual techniques, passive stretching, AA/ROM, A/ROM, start scap strengthening   Trish Mage, OTR/L [phone] 05/19/2023, 7:22 PM

## 2023-05-21 ENCOUNTER — Ambulatory Visit (HOSPITAL_COMMUNITY): Payer: 59 | Admitting: Occupational Therapy

## 2023-05-21 DIAGNOSIS — M25512 Pain in left shoulder: Secondary | ICD-10-CM | POA: Diagnosis not present

## 2023-05-21 DIAGNOSIS — R29898 Other symptoms and signs involving the musculoskeletal system: Secondary | ICD-10-CM

## 2023-05-21 NOTE — Therapy (Signed)
OUTPATIENT OCCUPATIONAL THERAPY ORTHO TREATMENT  Patient Name: Allison Gould MRN: [id number] DOB:11/23/1950, 72 y.o., female Today's Date: 05/22/2023   END OF SESSION:  OT End of Session - 05/21/23 1200     Visit Number 10    Number of Visits 12    Date for OT Re-Evaluation 05/21/23    Authorization Type [employer]    Progress Note Due on Visit 20    OT Start Time 1118    OT Stop Time 1158    OT Time Calculation (min) 40 min    Activity Tolerance Patient tolerated treatment well    Behavior During Therapy WFL for tasks assessed/performed             Past Medical History:  Diagnosis Date   Anxiety    Diabetes mellitus type II    GERD (gastroesophageal reflux disease)    Hearing aid worn    right   Hypertension    Lipid disorder    Loss of hair    for unknown reason, to see MD    Low back pain    Morbid obesity with BMI of 45.0-49.9, adult [facility])    Osteoarthritis    Past Surgical History:  Procedure Laterality Date   CATARACT EXTRACTION W/PHACO Left 05/12/2016   Procedure: CATARACT EXTRACTION PHACO AND INTRAOCULAR LENS PLACEMENT (IOC);  Surgeon: Gemma Payor, MD;  Location: [facility];  Service: Ophthalmology;  Laterality: Left;  CDE: 6.63   CATARACT EXTRACTION W/PHACO Right 06/16/2016   Procedure: CATARACT EXTRACTION PHACO AND INTRAOCULAR LENS PLACEMENT (IOC); CDE:  4.13;  Surgeon: Gemma Payor, MD;  Location: [facility];  Service: Ophthalmology;  Laterality: Right;   COLONOSCOPY  07/12/2012   Procedure: COLONOSCOPY;  Surgeon: West Bali, MD;  Location: [facility];  Service: Endoscopy;  Laterality: N/A;  1:00   lipoma-right shoulder     spur and nerve repair right shoulder     Patient Active Problem List   Diagnosis Date Noted   Hyperkalemia 09/17/2022   Hypernatremia 09/17/2022   Localized edema 07/23/2022   Paresthesia of hand 07/23/2022   Congestive heart failure (HCC) 07/12/2022   Chronic obstructive lung disease (HCC) 01/27/2022   Morbid obesity (HCC)  01/27/2022   Edema of lower extremity 09/30/2021   Lupus erythematosus 09/25/2021   Chronic insomnia 08/21/2021   Chronic kidney disease 06/27/2021   Thrombocytopenic disorder (HCC) 06/27/2021   Gastroesophageal reflux disease 05/24/2021   Hyperlipidemia 05/24/2021   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   Leg weakness 04/14/2013   Back pain 04/14/2013   Knee pain 04/14/2013   Pain in joint, shoulder region 03/11/2013   Muscle weakness (generalized) 03/11/2013   Pain in joint, ankle and foot 03/11/2013   Abnormality of gait 03/11/2013   Right ankle sprain 03/02/2013   Shoulder contusion 03/02/2013   Constipation 06/22/2012   Colon cancer screening 06/22/2012   Pain in limb 07/05/2009   FLANK PAIN, RIGHT 04/04/2009   ELECTROCARDIOGRAM, ABNORMAL 02/20/2009   DIABETES MELLITUS, WITH NEUROLOGICAL COMPLICATIONS 01/09/2009   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 Dwana Melena REFERRING PROVIDER: Lupita Raider, NP  ONSET DATE: ~03/16/23  REFERRING DIAG: left shoulder pain  THERAPY DIAG:  Other symptoms and signs involving the musculoskeletal system  Acute pain of left shoulder  Rationale for Evaluation and Treatment: Rehabilitation  SUBJECTIVE:   SUBJECTIVE  STATEMENT: S: Yesterday was a rough day.  PERTINENT HISTORY: Pt is a 72 y/o female presenting with left shoulder pain present for approximately 3 weeks. Pt goes to the chiropractor and reports no improvement. Pt reports she does not remember a precipitating event.   PRECAUTIONS: None  WEIGHT BEARING RESTRICTIONS: No  PAIN:  Are you having pain? Yes: NPRS scale: 4/10 Pain location: left shoulder Pain description: sore, aching Aggravating factors: certain movements Relieving factors: rest, Tylenol, pain cream   FALLS: Has  patient fallen in last 6 months? No  PLOF: Independent  PATIENT GOALS: To have less pin in the left arm.   NEXT MD VISIT: unsure  OBJECTIVE:   HAND DOMINANCE: Right  ADLs: Overall ADLs: Pt reports difficulty with reaching overhead, across body, and behind back. Pt has difficulty with dressing-specifically putting on a bra, bathing, fixing her hair, completing housework like pulling trash out of the can. Wakes up due to pain at night. Getting in and out of the tub is also difficult.   FUNCTIONAL OUTCOME MEASURES: FOTO: 51/100 05/21/23: 56.71  UPPER EXTREMITY ROM:       Assessed in supine, er/IR adducted  Passive ROM Left eval  Shoulder flexion 159  Shoulder abduction 158  Shoulder internal rotation 90  Shoulder external rotation 75  (Blank rows = not tested)    Assessed in sitting, er/IR adducted  Active ROM Left eval Left 05/21/23  Shoulder flexion 108 134  Shoulder abduction 81 146  Shoulder internal rotation 90 90  Shoulder external rotation 43 65  (Blank rows = not tested)    UPPER EXTREMITY MMT:     Assessed in sitting, er/IR adducted  MMT Left eval Left 05/21/23  Shoulder flexion 4-/5 4/5  Shoulder abduction 3-/5 4-/5  Shoulder internal rotation 4/5 4/5  Shoulder external rotation 3+/5 4-/5  (Blank rows = not tested)  HAND FUNCTION: Grip strength: Right: 60 lbs; Left: 35 lbs   OBSERVATIONS: mod fascial restrictions along upper arm, anterior shoulder, and trapezius regions   TODAY'S TREATMENT:                                                                                                                              DATE:   05/21/23 -Manual Therapy: myofascial release and trigger point applied to the biceps, deltoid, anterior shoulder girdle, scapular region, and trapezius to reduce pain and fascial restrictions in order to improve ROM. -A/ROM: seated-flexion, abduction, protraction, horizontal abduction, er/IR, x15 -shoulder strengthening: 1lb  dumbbell, seated, flexion, abduction, protraction, horizontal abduction, er/IR, x10 -ABC's in the air, 1lb dumbbell -X to V arms: 1lb dumbbell, 10 reps -Goal Post Arms: 1lb dumbbell, x10  -Wall Wash, x60" -measurements for reassessment  05/19/23 -Manual Therapy: myofascial release and trigger point applied to the biceps, deltoid, anterior shoulder girdle, scapular region, and trapezius to reduce pain and fascial restrictions in order to improve ROM. -A/ROM: seated-flexion, abduction, protraction, horizontal abduction, er/IR, x15 -Proximal shoulder strengthening: 1lb  dumbbells, paddles, criss cross, circles each direction, 10 reps -Scapular Strengthening: red bands, extension, retraction, rows, x10 -Shoulder Strengthening: red bands, horizontal abduction, er, IR, abduction, flexion, x10 -X to V arms: 1lb dumbbell, 10 reps -Goal Post Arms: 1lb dumbbell, x10   05/12/23 -Manual Therapy: myofascial release and trigger point applied to the biceps, deltoid, anterior shoulder girdle, scapular region, and trapezius to reduce pain and fascial restrictions in order to improve ROM. -Scapular A/ROM: row, extension, 12 reps -A/ROM: seated-flexion, abduction, protraction, horizontal abduction, er/IR, x10 -Functional Reaching: pt placing 10 cones on middle shelf of overhead cabinet in flexion, removing in abduction -X to V arms: 10 reps -Proximal shoulder strengthening: paddles, criss cross, circles each direction, 10 reps -ABC Writing: shoulder at 90 degrees, no weight, 3 rest breaks -Wall wash: 1' flexion -Overhead lacing: seated-lacing from top down then reversing -UBE: level 1-3' forward, 3' reverse, pace: 5.0   PATIENT EDUCATION: Education details: Continue HEP Person educated: Patient Education method: Explanation, Demonstration, and Handouts Education comprehension: verbalized understanding and returned demonstration  HOME EXERCISE PROGRAM: Eval: AA/ROM 6/27: A/ROM 7/2: Scapula  Strengthening 7/16: X to V arms and Goal Post arms  GOALS: Goals reviewed with patient? Yes   SHORT TERM GOALS: Target date: 04/30/23  Pt will be provided with and educated on HEP to improve mobility in LUE required for use during ADL completion.   Goal status: IN PROGRESS   2.  Pt will increase LUE strength to 4-/5 to improve ability to reach for items at waist to chest height during bathing and grooming tasks.   Goal status: IN PROGRESS    LONG TERM GOALS: Target date: 04/21/23  Pt will decrease pain in LUE to 3/10 or less to improve ability to sleep for 2+ consecutive hours without waking due to pain.   Goal status: IN PROGRESS  2.  Pt will decrease LUE fascial restrictions to min amounts or less to improve mobility required for functional reaching tasks.   Goal status: IN PROGRESS  3.  Pt will increase LUE A/ROM by 30 degrees or greater to improve ability to use LUE when reaching overhead or behind back during dressing and bathing tasks.   Goal status: IN PROGRESS  4.  Pt will increase LUE strength to 4/5 or greater to improve ability to use LUE when lifting trash or carrying items during meal preparation/housework/yardwork tasks.   Goal status: IN PROGRESS   ASSESSMENT:  CLINICAL IMPRESSION: Pt was reassessed this session where she demonstrated improving ROM and strength. She continues to fatigue quickly, with ROM decreasing with repetition. She requires multiple rest breaks throughout session. Attempted to increase to 2lb weights this session, however pt was unable to lift up against gravity while holding these weights. OT providing verbal and tactile cuing throughout session for positioning and technique.   PERFORMANCE DEFICITS: in functional skills including in functional skills including ADLs, IADLs, coordination, tone, ROM, strength, pain, fascial restrictions, muscle spasms, and UE functional use    PLAN:  OT FREQUENCY: 2x/week  OT DURATION: 6  weeks  PLANNED INTERVENTIONS: self care/ADL training, therapeutic exercise, therapeutic activity, neuromuscular re-education, manual therapy, passive range of motion, splinting, electrical stimulation, ultrasound, moist heat, cryotherapy, patient/family education, and DME and/or AE instructions  CONSULTED AND AGREED WITH PLAN OF CARE: Patient  PLAN FOR NEXT SESSION: Follow up on HEP, continue manual techniques, passive stretching, AA/ROM, A/ROM, start scap strengthening   Trish Mage, OTR/L [phone] 05/22/2023, 9:36 AM

## 2023-05-25 ENCOUNTER — Encounter (HOSPITAL_COMMUNITY): Payer: Self-pay | Admitting: Occupational Therapy

## 2023-05-25 ENCOUNTER — Ambulatory Visit (HOSPITAL_COMMUNITY): Payer: 59 | Admitting: Occupational Therapy

## 2023-05-25 DIAGNOSIS — M25512 Pain in left shoulder: Secondary | ICD-10-CM

## 2023-05-25 DIAGNOSIS — R29898 Other symptoms and signs involving the musculoskeletal system: Secondary | ICD-10-CM

## 2023-05-25 NOTE — Addendum Note (Signed)
Addended by: Haydee Monica on: 05/25/2023 08:13 AM   Modules accepted: Orders

## 2023-05-25 NOTE — Therapy (Signed)
OUTPATIENT OCCUPATIONAL THERAPY ORTHO TREATMENT  Patient Name: Allison Gould MRN: [id number] DOB:12-07-1950, 72 y.o., female Today's Date: 05/25/2023   END OF SESSION:  OT End of Session - 05/25/23 1158     Visit Number 11    Number of Visits 18    Date for OT Re-Evaluation 06/24/23    Authorization Type [employer] Medicare    Authorization - Visit Number 4    Progress Note Due on Visit 20    OT Start Time 1118    OT Stop Time 1156    OT Time Calculation (min) 38 min    Activity Tolerance Patient tolerated treatment well    Behavior During Therapy WFL for tasks assessed/performed              Past Medical History:  Diagnosis Date   Anxiety    Diabetes mellitus type II    GERD (gastroesophageal reflux disease)    Hearing aid worn    right   Hypertension    Lipid disorder    Loss of hair    for unknown reason, to see MD    Low back pain    Morbid obesity with BMI of 45.0-49.9, adult (HCC)    Osteoarthritis    Past Surgical History:  Procedure Laterality Date   CATARACT EXTRACTION W/PHACO Left 05/12/2016   Procedure: CATARACT EXTRACTION PHACO AND INTRAOCULAR LENS PLACEMENT (IOC);  Surgeon: Gemma Payor, MD;  Location: [facility];  Service: Ophthalmology;  Laterality: Left;  CDE: 6.63   CATARACT EXTRACTION W/PHACO Right 06/16/2016   Procedure: CATARACT EXTRACTION PHACO AND INTRAOCULAR LENS PLACEMENT (IOC); CDE:  4.13;  Surgeon: Gemma Payor, MD;  Location: [facility];  Service: Ophthalmology;  Laterality: Right;   COLONOSCOPY  07/12/2012   Procedure: COLONOSCOPY;  Surgeon: West Bali, MD;  Location: [facility];  Service: Endoscopy;  Laterality: N/A;  1:00   lipoma-right shoulder     spur and nerve repair right shoulder     Patient Active Problem List   Diagnosis Date Noted   Hyperkalemia 09/17/2022   Hypernatremia 09/17/2022   Localized edema 07/23/2022   Paresthesia of hand 07/23/2022   Congestive heart failure (HCC) 07/12/2022   Chronic obstructive lung disease (HCC)  01/27/2022   Morbid obesity (HCC) 01/27/2022   Edema of lower extremity 09/30/2021   Lupus erythematosus 09/25/2021   Chronic insomnia 08/21/2021   Chronic kidney disease 06/27/2021   Thrombocytopenic disorder (HCC) 06/27/2021   Gastroesophageal reflux disease 05/24/2021   Hyperlipidemia 05/24/2021   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   Leg weakness 04/14/2013   Back pain 04/14/2013   Knee pain 04/14/2013   Pain in joint, shoulder region 03/11/2013   Muscle weakness (generalized) 03/11/2013   Pain in joint, ankle and foot 03/11/2013   Abnormality of gait 03/11/2013   Right ankle sprain 03/02/2013   Shoulder contusion 03/02/2013   Constipation 06/22/2012   Colon cancer screening 06/22/2012   Pain in limb 07/05/2009   FLANK PAIN, RIGHT 04/04/2009   ELECTROCARDIOGRAM, ABNORMAL 02/20/2009   DIABETES MELLITUS, WITH NEUROLOGICAL COMPLICATIONS 01/09/2009   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 Dwana Melena REFERRING PROVIDER: Lupita Raider, NP  ONSET DATE: ~03/16/23  REFERRING DIAG: left shoulder pain  THERAPY DIAG:  Other symptoms and signs involving the musculoskeletal system  Acute pain of left shoulder  Rationale for  Evaluation and Treatment: Rehabilitation  SUBJECTIVE:   SUBJECTIVE STATEMENT: S: I can do pretty good until I have to lift way on up there.   PERTINENT HISTORY: Pt is a 72 y/o female presenting with left shoulder pain present for approximately 3 weeks. Pt goes to the chiropractor and reports no improvement. Pt reports she does not remember a precipitating event.   PRECAUTIONS: None  WEIGHT BEARING RESTRICTIONS: No  PAIN:  Are you having pain? Yes: NPRS scale: 4/10 Pain location: left shoulder Pain description: sore, aching Aggravating factors: certain  movements Relieving factors: rest, Tylenol, pain cream   FALLS: Has patient fallen in last 6 months? No  PLOF: Independent  PATIENT GOALS: To have less pin in the left arm.   NEXT MD VISIT: unsure  OBJECTIVE:   HAND DOMINANCE: Right  ADLs: Overall ADLs: Pt reports difficulty with reaching overhead, across body, and behind back. Pt has difficulty with dressing-specifically putting on a bra, bathing, fixing her hair, completing housework like pulling trash out of the can. Wakes up due to pain at night. Getting in and out of the tub is also difficult.   FUNCTIONAL OUTCOME MEASURES: FOTO: 51/100 05/21/23: 56.71  UPPER EXTREMITY ROM:       Assessed in supine, er/IR adducted  Passive ROM Left eval  Shoulder flexion 159  Shoulder abduction 158  Shoulder internal rotation 90  Shoulder external rotation 75  (Blank rows = not tested)    Assessed in sitting, er/IR adducted  Active ROM Left eval Left 05/21/23  Shoulder flexion 108 134  Shoulder abduction 81 146  Shoulder internal rotation 90 90  Shoulder external rotation 43 65  (Blank rows = not tested)    UPPER EXTREMITY MMT:     Assessed in sitting, er/IR adducted  MMT Left eval Left 05/21/23  Shoulder flexion 4-/5 4/5  Shoulder abduction 3-/5 4-/5  Shoulder internal rotation 4/5 4/5  Shoulder external rotation 3+/5 4-/5  (Blank rows = not tested)  HAND FUNCTION: Grip strength: Right: 60 lbs; Left: 35 lbs   OBSERVATIONS: mod fascial restrictions along upper arm, anterior shoulder, and trapezius regions   TODAY'S TREATMENT:                                                                                                                              DATE:  05/25/23 -Shoulder strengthening: 1#- seated, flexion, abduction, protraction, horizontal abduction, er/IR, x10 -proximal shoulder strengthening: sitting-paddles, criss cross, circles each direction, 10 reps each -X to V arms: 10 reps -ABC writing: shoulder  at 90 degrees flexion, no weight -Red theraband: seated-row, extension, retraction, 10 reps -Red theraband: seated-protraction, flexion, horizontal abduction, er, abduction, 10 reps   05/21/23 -Manual Therapy: myofascial release and trigger point applied to the biceps, deltoid, anterior shoulder girdle, scapular region, and trapezius to reduce pain and fascial restrictions in order to improve ROM. -A/ROM: seated-flexion, abduction, protraction, horizontal abduction, er/IR, x15 -shoulder strengthening: 1lb dumbbell, seated, flexion, abduction,  protraction, horizontal abduction, er/IR, x10 -ABC's in the air, 1lb dumbbell -X to V arms: 1lb dumbbell, 10 reps -Goal Post Arms: 1lb dumbbell, x10  -Wall Wash, x60" -measurements for reassessment  05/19/23 -Manual Therapy: myofascial release and trigger point applied to the biceps, deltoid, anterior shoulder girdle, scapular region, and trapezius to reduce pain and fascial restrictions in order to improve ROM. -A/ROM: seated-flexion, abduction, protraction, horizontal abduction, er/IR, x15 -Proximal shoulder strengthening: 1lb dumbbells, paddles, criss cross, circles each direction, 10 reps -Scapular Strengthening: red bands, extension, retraction, rows, x10 -Shoulder Strengthening: red bands, horizontal abduction, er, IR, abduction, flexion, x10 -X to V arms: 1lb dumbbell, 10 reps -Goal Post Arms: 1lb dumbbell, x10    PATIENT EDUCATION: Education details: Continue HEP Person educated: Patient Education method: Explanation, Demonstration, and Handouts Education comprehension: verbalized understanding and returned demonstration  HOME EXERCISE PROGRAM: Eval: AA/ROM 6/27: A/ROM 7/2: Scapula Strengthening 7/16: X to V arms and Goal Post arms  GOALS: Goals reviewed with patient? Yes   SHORT TERM GOALS: Target date: 04/30/23  Pt will be provided with and educated on HEP to improve mobility in LUE required for use during ADL completion.    Goal status: IN PROGRESS   2.  Pt will increase LUE strength to 4-/5 to improve ability to reach for items at waist to chest height during bathing and grooming tasks.   Goal status: IN PROGRESS    LONG TERM GOALS: Target date: 04/21/23  Pt will decrease pain in LUE to 3/10 or less to improve ability to sleep for 2+ consecutive hours without waking due to pain.   Goal status: IN PROGRESS  2.  Pt will decrease LUE fascial restrictions to min amounts or less to improve mobility required for functional reaching tasks.   Goal status: IN PROGRESS  3.  Pt will increase LUE A/ROM by 30 degrees or greater to improve ability to use LUE when reaching overhead or behind back during dressing and bathing tasks.   Goal status: IN PROGRESS  4.  Pt will increase LUE strength to 4/5 or greater to improve ability to use LUE when lifting trash or carrying items during meal preparation/housework/yardwork tasks.   Goal status: IN PROGRESS   ASSESSMENT:  CLINICAL IMPRESSION: Pt reports HEP is doing well, does have to take rest breaks when lifting her arms. Continued with seated strengthening using 1# weights, rest breaks provided as needed. Focusing on form versus speed or using momentum to complete the task. Added red theraband strengthening, intermittent rest breaks required due to fatigue. Verbal cuing for form and technique.   PERFORMANCE DEFICITS: in functional skills including in functional skills including ADLs, IADLs, coordination, tone, ROM, strength, pain, fascial restrictions, muscle spasms, and UE functional use    PLAN:  OT FREQUENCY: 2x/week  OT DURATION: 6 weeks  PLANNED INTERVENTIONS: self care/ADL training, therapeutic exercise, therapeutic activity, neuromuscular re-education, manual therapy, passive range of motion, splinting, electrical stimulation, ultrasound, moist heat, cryotherapy, patient/family education, and DME and/or AE instructions  CONSULTED AND AGREED WITH  PLAN OF CARE: Patient  PLAN FOR NEXT SESSION: Follow up on HEP, continue manual techniques, passive stretching, AA/ROM, A/ROM, start scap strengthening   Ezra Sites, OTR/L  [phone] 05/25/2023, 11:59 AM

## 2023-05-27 DIAGNOSIS — M9905 Segmental and somatic dysfunction of pelvic region: Secondary | ICD-10-CM | POA: Diagnosis not present

## 2023-05-27 DIAGNOSIS — M9903 Segmental and somatic dysfunction of lumbar region: Secondary | ICD-10-CM | POA: Diagnosis not present

## 2023-05-27 DIAGNOSIS — M5442 Lumbago with sciatica, left side: Secondary | ICD-10-CM | POA: Diagnosis not present

## 2023-05-27 DIAGNOSIS — M25512 Pain in left shoulder: Secondary | ICD-10-CM | POA: Diagnosis not present

## 2023-05-27 DIAGNOSIS — M9902 Segmental and somatic dysfunction of thoracic region: Secondary | ICD-10-CM | POA: Diagnosis not present

## 2023-05-28 DIAGNOSIS — I129 Hypertensive chronic kidney disease with stage 1 through stage 4 chronic kidney disease, or unspecified chronic kidney disease: Secondary | ICD-10-CM | POA: Diagnosis not present

## 2023-05-28 DIAGNOSIS — E211 Secondary hyperparathyroidism, not elsewhere classified: Secondary | ICD-10-CM | POA: Diagnosis not present

## 2023-05-28 DIAGNOSIS — N189 Chronic kidney disease, unspecified: Secondary | ICD-10-CM | POA: Diagnosis not present

## 2023-05-28 DIAGNOSIS — D638 Anemia in other chronic diseases classified elsewhere: Secondary | ICD-10-CM | POA: Diagnosis not present

## 2023-05-28 DIAGNOSIS — E1122 Type 2 diabetes mellitus with diabetic chronic kidney disease: Secondary | ICD-10-CM | POA: Diagnosis not present

## 2023-05-29 DIAGNOSIS — N2581 Secondary hyperparathyroidism of renal origin: Secondary | ICD-10-CM | POA: Diagnosis not present

## 2023-05-29 DIAGNOSIS — E1122 Type 2 diabetes mellitus with diabetic chronic kidney disease: Secondary | ICD-10-CM | POA: Diagnosis not present

## 2023-05-29 DIAGNOSIS — N1832 Chronic kidney disease, stage 3b: Secondary | ICD-10-CM | POA: Diagnosis not present

## 2023-05-29 DIAGNOSIS — E870 Hyperosmolality and hypernatremia: Secondary | ICD-10-CM | POA: Diagnosis not present

## 2023-06-04 ENCOUNTER — Ambulatory Visit (HOSPITAL_COMMUNITY): Payer: 59 | Attending: Family Medicine | Admitting: Occupational Therapy

## 2023-06-04 ENCOUNTER — Encounter (HOSPITAL_COMMUNITY): Payer: Self-pay | Admitting: Occupational Therapy

## 2023-06-04 DIAGNOSIS — M25512 Pain in left shoulder: Secondary | ICD-10-CM | POA: Insufficient documentation

## 2023-06-04 DIAGNOSIS — R29898 Other symptoms and signs involving the musculoskeletal system: Secondary | ICD-10-CM | POA: Diagnosis not present

## 2023-06-04 NOTE — Therapy (Signed)
OUTPATIENT OCCUPATIONAL THERAPY ORTHO TREATMENT  Patient Name: Allison Gould MRN: [id number] DOB:1951/07/26, 72 y.o., female Today's Date: 06/04/2023   END OF SESSION:  OT End of Session - 06/04/23 1158     Visit Number 12    Number of Visits 18    Date for OT Re-Evaluation 06/24/23    Authorization Type [employer]    Progress Note Due on Visit 20    OT Start Time 1116    OT Stop Time 1156    OT Time Calculation (min) 40 min    Activity Tolerance Patient tolerated treatment well    Behavior During Therapy WFL for tasks assessed/performed               Past Medical History:  Diagnosis Date   Anxiety    Diabetes mellitus type II    GERD (gastroesophageal reflux disease)    Hearing aid worn    right   Hypertension    Lipid disorder    Loss of hair    for unknown reason, to see MD    Low back pain    Morbid obesity with BMI of 45.0-49.9, adult (HCC)    Osteoarthritis    Past Surgical History:  Procedure Laterality Date   CATARACT EXTRACTION W/PHACO Left 05/12/2016   Procedure: CATARACT EXTRACTION PHACO AND INTRAOCULAR LENS PLACEMENT (IOC);  Surgeon: Gemma Payor, MD;  Location: [facility];  Service: Ophthalmology;  Laterality: Left;  CDE: 6.63   CATARACT EXTRACTION W/PHACO Right 06/16/2016   Procedure: CATARACT EXTRACTION PHACO AND INTRAOCULAR LENS PLACEMENT (IOC); CDE:  4.13;  Surgeon: Gemma Payor, MD;  Location: [facility];  Service: Ophthalmology;  Laterality: Right;   COLONOSCOPY  07/12/2012   Procedure: COLONOSCOPY;  Surgeon: West Bali, MD;  Location: [facility];  Service: Endoscopy;  Laterality: N/A;  1:00   lipoma-right shoulder     spur and nerve repair right shoulder     Patient Active Problem List   Diagnosis Date Noted   Hyperkalemia 09/17/2022   Hypernatremia 09/17/2022   Localized edema 07/23/2022   Paresthesia of hand 07/23/2022   Congestive heart failure (HCC) 07/12/2022   Chronic obstructive lung disease (HCC) 01/27/2022   Morbid obesity (HCC)  01/27/2022   Edema of lower extremity 09/30/2021   Lupus erythematosus 09/25/2021   Chronic insomnia 08/21/2021   Chronic kidney disease 06/27/2021   Thrombocytopenic disorder (HCC) 06/27/2021   Gastroesophageal reflux disease 05/24/2021   Hyperlipidemia 05/24/2021   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   Leg weakness 04/14/2013   Back pain 04/14/2013   Knee pain 04/14/2013   Pain in joint, shoulder region 03/11/2013   Muscle weakness (generalized) 03/11/2013   Pain in joint, ankle and foot 03/11/2013   Abnormality of gait 03/11/2013   Right ankle sprain 03/02/2013   Shoulder contusion 03/02/2013   Constipation 06/22/2012   Colon cancer screening 06/22/2012   Pain in limb 07/05/2009   FLANK PAIN, RIGHT 04/04/2009   ELECTROCARDIOGRAM, ABNORMAL 02/20/2009   DIABETES MELLITUS, WITH NEUROLOGICAL COMPLICATIONS 01/09/2009   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 Dwana Melena REFERRING PROVIDER: Lupita Raider, NP  ONSET DATE: ~03/16/23  REFERRING DIAG: left shoulder pain  THERAPY DIAG:  Other symptoms and signs involving the musculoskeletal system  Acute pain of left shoulder  Rationale for Evaluation and Treatment: Rehabilitation  SUBJECTIVE:  SUBJECTIVE STATEMENT: S: My shoulder is still sore.   PERTINENT HISTORY: Pt is a 72 y/o female presenting with left shoulder pain present for approximately 3 weeks. Pt goes to the chiropractor and reports no improvement. Pt reports she does not remember a precipitating event.   PRECAUTIONS: None  WEIGHT BEARING RESTRICTIONS: No  PAIN:  Are you having pain? Yes: NPRS scale: 5/10 Pain location: left shoulder Pain description: sore, aching Aggravating factors: certain movements Relieving factors: rest, Tylenol, pain cream   FALLS: Has  patient fallen in last 6 months? No  PLOF: Independent  PATIENT GOALS: To have less pin in the left arm.   NEXT MD VISIT: unsure  OBJECTIVE:   HAND DOMINANCE: Right  ADLs: Overall ADLs: Pt reports difficulty with reaching overhead, across body, and behind back. Pt has difficulty with dressing-specifically putting on a bra, bathing, fixing her hair, completing housework like pulling trash out of the can. Wakes up due to pain at night. Getting in and out of the tub is also difficult.   FUNCTIONAL OUTCOME MEASURES: FOTO: 51/100 05/21/23: 56.71  UPPER EXTREMITY ROM:       Assessed in supine, er/IR adducted  Passive ROM Left eval  Shoulder flexion 159  Shoulder abduction 158  Shoulder internal rotation 90  Shoulder external rotation 75  (Blank rows = not tested)    Assessed in sitting, er/IR adducted  Active ROM Left eval Left 05/21/23  Shoulder flexion 108 134  Shoulder abduction 81 146  Shoulder internal rotation 90 90  Shoulder external rotation 43 65  (Blank rows = not tested)    UPPER EXTREMITY MMT:     Assessed in sitting, er/IR adducted  MMT Left eval Left 05/21/23  Shoulder flexion 4-/5 4/5  Shoulder abduction 3-/5 4-/5  Shoulder internal rotation 4/5 4/5  Shoulder external rotation 3+/5 4-/5  (Blank rows = not tested)  HAND FUNCTION: Grip strength: Right: 60 lbs; Left: 35 lbs   OBSERVATIONS: mod fascial restrictions along upper arm, anterior shoulder, and trapezius regions   TODAY'S TREATMENT:                                                                                                                              DATE:  06/04/23 -A/ROM: seated-flexion, abduction, protraction, horizontal abduction, er/IR, x10; 2 rounds -proximal shoulder strengthening: sitting-paddles, criss cross, circles each direction, 10 reps each -x to v arms: 10 reps, 2 rounds -Red theraband: seated-row, extension, retraction, 10 reps -Red theraband: seated-protraction,  flexion, horizontal abduction, er, 10 reps -ABC writing: shoulder at 90 degrees flexion, no weight, 3 rest breaks -Overhead press: 10 reps -Arnold press: 10 reps  05/25/23 -Shoulder strengthening: 1#- seated, flexion, abduction, protraction, horizontal abduction, er/IR, x10 -proximal shoulder strengthening: sitting-paddles, criss cross, circles each direction, 10 reps each -X to V arms: 10 reps -ABC writing: shoulder at 90 degrees flexion, no weight -Red theraband: seated-row, extension, retraction, 10 reps -Red theraband: seated-protraction, flexion, horizontal abduction, er,  abduction, 10 reps  05/21/23 -Manual Therapy: myofascial release and trigger point applied to the biceps, deltoid, anterior shoulder girdle, scapular region, and trapezius to reduce pain and fascial restrictions in order to improve ROM. -A/ROM: seated-flexion, abduction, protraction, horizontal abduction, er/IR, x15 -shoulder strengthening: 1lb dumbbell, seated, flexion, abduction, protraction, horizontal abduction, er/IR, x10 -ABC's in the air, 1lb dumbbell -X to V arms: 1lb dumbbell, 10 reps -Goal Post Arms: 1lb dumbbell, x10  -Wall Wash, x60" -measurements for reassessment   PATIENT EDUCATION: Education details: Continue HEP Person educated: Patient Education method: Explanation, Demonstration, and Handouts Education comprehension: verbalized understanding and returned demonstration  HOME EXERCISE PROGRAM: Eval: AA/ROM 6/27: A/ROM 7/2: Scapula Strengthening 7/16: X to V arms and Goal Post arms  GOALS: Goals reviewed with patient? Yes   SHORT TERM GOALS: Target date: 04/30/23  Pt will be provided with and educated on HEP to improve mobility in LUE required for use during ADL completion.   Goal status: IN PROGRESS   2.  Pt will increase LUE strength to 4-/5 to improve ability to reach for items at waist to chest height during bathing and grooming tasks.   Goal status: IN PROGRESS    LONG TERM  GOALS: Target date: 04/21/23  Pt will decrease pain in LUE to 3/10 or less to improve ability to sleep for 2+ consecutive hours without waking due to pain.   Goal status: IN PROGRESS  2.  Pt will decrease LUE fascial restrictions to min amounts or less to improve mobility required for functional reaching tasks.   Goal status: IN PROGRESS  3.  Pt will increase LUE A/ROM by 30 degrees or greater to improve ability to use LUE when reaching overhead or behind back during dressing and bathing tasks.   Goal status: IN PROGRESS  4.  Pt will increase LUE strength to 4/5 or greater to improve ability to use LUE when lifting trash or carrying items during meal preparation/housework/yardwork tasks.   Goal status: IN PROGRESS   ASSESSMENT:  CLINICAL IMPRESSION: Pt reports HEP is doing well, has difficulty carrying items up to her apartment, 18 steps. Continued with seated A/ROM focusing on form and technique no weights today. Continued with red theraband strengthening, did not complete abduction due to form. Continued with proximal shoulder strengthening and stability, improving activity tolerance with minimal rest breaks required today. Verbal cuing for form and technique.   PERFORMANCE DEFICITS: in functional skills including in functional skills including ADLs, IADLs, coordination, tone, ROM, strength, pain, fascial restrictions, muscle spasms, and UE functional use    PLAN:  OT FREQUENCY: 2x/week  OT DURATION: 6 weeks  PLANNED INTERVENTIONS: self care/ADL training, therapeutic exercise, therapeutic activity, neuromuscular re-education, manual therapy, passive range of motion, splinting, electrical stimulation, ultrasound, moist heat, cryotherapy, patient/family education, and DME and/or AE instructions  CONSULTED AND AGREED WITH PLAN OF CARE: Patient  PLAN FOR NEXT SESSION: Follow up on HEP, continue manual techniques, passive stretching,, A/ROM to strengthening, scapular  strengthening   Ezra Sites, OTR/L  [phone] 06/04/2023, 11:59 AM

## 2023-06-10 ENCOUNTER — Other Ambulatory Visit (INDEPENDENT_AMBULATORY_CARE_PROVIDER_SITE_OTHER): Payer: 59

## 2023-06-10 ENCOUNTER — Ambulatory Visit (INDEPENDENT_AMBULATORY_CARE_PROVIDER_SITE_OTHER): Payer: 59 | Admitting: Orthopedic Surgery

## 2023-06-10 ENCOUNTER — Encounter: Payer: Self-pay | Admitting: Orthopedic Surgery

## 2023-06-10 VITALS — BP 143/67 | HR 74 | Ht 65.0 in | Wt 295.0 lb

## 2023-06-10 DIAGNOSIS — M25512 Pain in left shoulder: Secondary | ICD-10-CM

## 2023-06-10 DIAGNOSIS — G8929 Other chronic pain: Secondary | ICD-10-CM

## 2023-06-10 MED ORDER — METHYLPREDNISOLONE ACETATE 40 MG/ML IJ SUSP
40.0000 mg | Freq: Once | INTRAMUSCULAR | Status: AC
Start: 2023-06-10 — End: 2023-06-10
  Administered 2023-06-10: 40 mg via INTRA_ARTICULAR

## 2023-06-10 NOTE — Progress Notes (Signed)
Orthopaedic Clinic Return  Assessment: Allison Gould is a 72 y.o. female with the following: Left shoulder pain  Plan: Allison Gould has chronic left shoulder pain.  No specific injury.  Radiographs obtained today demonstrates possibility of proximal humeral migration, consistent with chronic rotator cuff injury.  There is also calcium deposition within the AHI.  This is likely restricting her motion.  She has had some relief following a previous steroid injection by Dr. Romeo Apple, and I recommend repeating this today.  No restrictions.  She will resume her physical therapy.  Procedure note injection Left shoulder    Verbal consent was obtained to inject the left shoulder, subacromial space Timeout was completed to confirm the site of injection.  The skin was prepped with alcohol and ethyl chloride was sprayed at the injection site.  A 21-gauge needle was used to inject 40 mg of Depo-Medrol and 1% lidocaine (3 cc) into the subacromial space of the left shoulder using a posterolateral approach.  There were no complications. A sterile bandage was applied.    Body mass index is 49.09 kg/m.  Follow-up: Return if symptoms worsen or fail to improve.   Subjective:  Chief Complaint  Patient presents with   Shoulder Pain    Left has been to chiropractor 7 months     History of Present Illness: Allison Gould is a 72 y.o. female who returns to clinic for repeat evaluation of left shoulder pain.  She has previously seen Dr. Romeo Apple for the same complaint.  A little over 7 months ago, he provided her with an injection.  She cannot state whether this was helpful or not.  She continues to have pain, and difficulty with overhead motion.  She has been working with physical therapy.  No prior injury to her left shoulder.  She does have a history of right shoulder surgery.  She is right-hand dominant.  Review of Systems: No fevers or chills No numbness or tingling No chest pain No shortness of  breath No bowel or bladder dysfunction No GI distress No headaches   Objective: BP (!) 143/67   Pulse 74   Ht 5\' 5"  (1.651 m)   Wt 295 lb (133.8 kg)   BMI 49.09 kg/m   Physical Exam:  Alert and oriented.  No acute distress.  She ambulates with the assistance of a cane.  Evaluation left shoulder demonstrates no deformity.  Tenderness palpation of the lateral posterior aspect of the shoulder.  Limited forward flexion.  She has difficulty getting above the level of her shoulder.  Fingers warm well-perfused.  IMAGING: I personally ordered and reviewed the following images:  X-rays left shoulder were obtained in clinic today.  No acute injuries are noted.  There are some degenerative changes within the glenohumeral joint.  There is calcification lateral and inferior to the lateral edge of the acromion.  No obvious proximal humeral migration.  No bony lesions.  Impression: Left shoulder x-rays with possible proximal humeral migration, and calcium deposits superior to the humeral head   Oliver Barre, MD 06/10/2023 11:57 AM

## 2023-06-10 NOTE — Patient Instructions (Signed)

## 2023-06-15 ENCOUNTER — Ambulatory Visit (HOSPITAL_COMMUNITY): Payer: 59 | Admitting: Occupational Therapy

## 2023-06-15 ENCOUNTER — Encounter (HOSPITAL_COMMUNITY): Payer: Self-pay | Admitting: Occupational Therapy

## 2023-06-15 DIAGNOSIS — M25512 Pain in left shoulder: Secondary | ICD-10-CM | POA: Diagnosis not present

## 2023-06-15 DIAGNOSIS — R29898 Other symptoms and signs involving the musculoskeletal system: Secondary | ICD-10-CM

## 2023-06-15 NOTE — Therapy (Signed)
OUTPATIENT OCCUPATIONAL THERAPY ORTHO TREATMENT  Patient Name: Allison Gould Place MRN: [id number] DOB:16-Oct-1951, 72 y.o., female Today's Date: 06/15/2023   END OF SESSION:  OT End of Session - 06/15/23 1150     Visit Number 13    Number of Visits 18    Date for OT Re-Evaluation 06/24/23    Authorization Type [employer] Medicare    Progress Note Due on Visit 20    OT Start Time 1115    OT Stop Time 1156    OT Time Calculation (min) 41 min    Activity Tolerance Patient tolerated treatment well    Behavior During Therapy WFL for tasks assessed/performed                Past Medical History:  Diagnosis Date   Anxiety    Diabetes mellitus type II    GERD (gastroesophageal reflux disease)    Hearing aid worn    right   Hypertension    Lipid disorder    Loss of hair    for unknown reason, to see MD    Low back pain    Morbid obesity with BMI of 45.0-49.9, adult (HCC)    Osteoarthritis    Past Surgical History:  Procedure Laterality Date   CATARACT EXTRACTION W/PHACO Left 05/12/2016   Procedure: CATARACT EXTRACTION PHACO AND INTRAOCULAR LENS PLACEMENT (IOC);  Surgeon: Gemma Payor, MD;  Location: [facility];  Service: Ophthalmology;  Laterality: Left;  CDE: 6.63   CATARACT EXTRACTION W/PHACO Right 06/16/2016   Procedure: CATARACT EXTRACTION PHACO AND INTRAOCULAR LENS PLACEMENT (IOC); CDE:  4.13;  Surgeon: Gemma Payor, MD;  Location: [facility];  Service: Ophthalmology;  Laterality: Right;   COLONOSCOPY  07/12/2012   Procedure: COLONOSCOPY;  Surgeon: West Bali, MD;  Location: [facility];  Service: Endoscopy;  Laterality: N/A;  1:00   lipoma-right shoulder     spur and nerve repair right shoulder     Patient Active Problem List   Diagnosis Date Noted   Hyperkalemia 09/17/2022   Hypernatremia 09/17/2022   Localized edema 07/23/2022   Paresthesia of hand 07/23/2022   Congestive heart failure (HCC) 07/12/2022   Chronic obstructive lung disease (HCC) 01/27/2022   Morbid obesity (HCC)  01/27/2022   Edema of lower extremity 09/30/2021   Lupus erythematosus 09/25/2021   Chronic insomnia 08/21/2021   Chronic kidney disease 06/27/2021   Thrombocytopenic disorder (HCC) 06/27/2021   Gastroesophageal reflux disease 05/24/2021   Hyperlipidemia 05/24/2021   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   Leg weakness 04/14/2013   Back pain 04/14/2013   Knee pain 04/14/2013   Pain in joint, shoulder region 03/11/2013   Muscle weakness (generalized) 03/11/2013   Pain in joint, ankle and foot 03/11/2013   Abnormality of gait 03/11/2013   Right ankle sprain 03/02/2013   Shoulder contusion 03/02/2013   Constipation 06/22/2012   Colon cancer screening 06/22/2012   Pain in limb 07/05/2009   FLANK PAIN, RIGHT 04/04/2009   ELECTROCARDIOGRAM, ABNORMAL 02/20/2009   DIABETES MELLITUS, WITH NEUROLOGICAL COMPLICATIONS 01/09/2009   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 Dwana Melena REFERRING PROVIDER: Lupita Raider, NP  ONSET DATE: ~03/16/23  REFERRING DIAG: left shoulder pain  THERAPY DIAG:  Other symptoms and signs involving the musculoskeletal system  Acute pain of left shoulder  Rationale for Evaluation and Treatment: Rehabilitation  SUBJECTIVE:  SUBJECTIVE STATEMENT: S: It's been alright so far after the injection.   PERTINENT HISTORY: Pt is a 72 y/o female presenting with left shoulder pain present for approximately 3 weeks. Pt goes to the chiropractor and reports no improvement. Pt reports she does not remember a precipitating event.   PRECAUTIONS: None  WEIGHT BEARING RESTRICTIONS: No  PAIN:  Are you having pain? Yes: NPRS scale: 5/10 Pain location: left shoulder Pain description: sore, aching Aggravating factors: certain movements Relieving factors: rest, Tylenol, pain  cream   FALLS: Has patient fallen in last 6 months? No  PLOF: Independent  PATIENT GOALS: To have less pin in the left arm.   NEXT MD VISIT: unsure  OBJECTIVE:   HAND DOMINANCE: Right  ADLs: Overall ADLs: Pt reports difficulty with reaching overhead, across body, and behind back. Pt has difficulty with dressing-specifically putting on a bra, bathing, fixing her hair, completing housework like pulling trash out of the can. Wakes up due to pain at night. Getting in and out of the tub is also difficult.   FUNCTIONAL OUTCOME MEASURES: FOTO: 51/100 05/21/23: 56.71  UPPER EXTREMITY ROM:       Assessed in supine, er/IR adducted  Passive ROM Left eval  Shoulder flexion 159  Shoulder abduction 158  Shoulder internal rotation 90  Shoulder external rotation 75  (Blank rows = not tested)    Assessed in sitting, er/IR adducted  Active ROM Left eval Left 05/21/23  Shoulder flexion 108 134  Shoulder abduction 81 146  Shoulder internal rotation 90 90  Shoulder external rotation 43 65  (Blank rows = not tested)    UPPER EXTREMITY MMT:     Assessed in sitting, er/IR adducted  MMT Left eval Left 05/21/23  Shoulder flexion 4-/5 4/5  Shoulder abduction 3-/5 4-/5  Shoulder internal rotation 4/5 4/5  Shoulder external rotation 3+/5 4-/5  (Blank rows = not tested)  HAND FUNCTION: Grip strength: Right: 60 lbs; Left: 35 lbs   OBSERVATIONS: mod fascial restrictions along upper arm, anterior shoulder, and trapezius regions   TODAY'S TREATMENT:                                                                                                                              DATE:  06/15/23 -A/ROM: seated-flexion, abduction, protraction, horizontal abduction, er/IR, x10; 2 rounds -proximal shoulder strengthening: sitting-paddles, criss cross, circles each direction, 10 reps each -x to v arms: 10 reps, 2 rounds -Red theraband: seated-row, extension, retraction, 10 reps -Red  theraband: seated-protraction, flexion, horizontal abduction, er, 10 reps -Blue kickball: overhead press, chest press, flexion, circles each direction, 10 reps -UBE: level 1, 2' forward, 2' reverse, pace: 5.0  06/04/23 -A/ROM: seated-flexion, abduction, protraction, horizontal abduction, er/IR, x10; 2 rounds -proximal shoulder strengthening: sitting-paddles, criss cross, circles each direction, 10 reps each -x to v arms: 10 reps, 2 rounds -Red theraband: seated-row, extension, retraction, 10 reps -Red theraband: seated-protraction, flexion, horizontal abduction, er, 10 reps -ABC writing:  shoulder at 90 degrees flexion, no weight, 3 rest breaks -Overhead press: 10 reps -Arnold press: 10 reps  05/25/23 -Shoulder strengthening: 1#- seated, flexion, abduction, protraction, horizontal abduction, er/IR, x10 -proximal shoulder strengthening: sitting-paddles, criss cross, circles each direction, 10 reps each -X to V arms: 10 reps -ABC writing: shoulder at 90 degrees flexion, no weight -Red theraband: seated-row, extension, retraction, 10 reps -Red theraband: seated-protraction, flexion, horizontal abduction, er, abduction, 10 reps    PATIENT EDUCATION: Education details: Continue HEP Person educated: Patient Education method: Explanation, Demonstration, and Handouts Education comprehension: verbalized understanding and returned demonstration  HOME EXERCISE PROGRAM: Eval: AA/ROM 6/27: A/ROM 7/2: Scapula Strengthening 7/16: X to V arms and Goal Post arms  GOALS: Goals reviewed with patient? Yes   SHORT TERM GOALS: Target date: 04/30/23  Pt will be provided with and educated on HEP to improve mobility in LUE required for use during ADL completion.   Goal status: IN PROGRESS   2.  Pt will increase LUE strength to 4-/5 to improve ability to reach for items at waist to chest height during bathing and grooming tasks.   Goal status: IN PROGRESS    LONG TERM GOALS: Target date:  04/21/23  Pt will decrease pain in LUE to 3/10 or less to improve ability to sleep for 2+ consecutive hours without waking due to pain.   Goal status: IN PROGRESS  2.  Pt will decrease LUE fascial restrictions to min amounts or less to improve mobility required for functional reaching tasks.   Goal status: IN PROGRESS  3.  Pt will increase LUE A/ROM by 30 degrees or greater to improve ability to use LUE when reaching overhead or behind back during dressing and bathing tasks.   Goal status: IN PROGRESS  4.  Pt will increase LUE strength to 4/5 or greater to improve ability to use LUE when lifting trash or carrying items during meal preparation/housework/yardwork tasks.   Goal status: IN PROGRESS   ASSESSMENT:  CLINICAL IMPRESSION: Pt reports that she went to the MD, x-rays were negative, pt had another steroid injection. Pt reports she has not seen much change since the injection. Continued with A/ROM and theraband strengthening today. Pt with improved activity tolerance, added therapy ball strengthening with lightweight ball. Also added UBE. Verbal cuing for form and technique.   PERFORMANCE DEFICITS: in functional skills including in functional skills including ADLs, IADLs, coordination, tone, ROM, strength, pain, fascial restrictions, muscle spasms, and UE functional use    PLAN:  OT FREQUENCY: 2x/week  OT DURATION: 6 weeks  PLANNED INTERVENTIONS: self care/ADL training, therapeutic exercise, therapeutic activity, neuromuscular re-education, manual therapy, passive range of motion, splinting, electrical stimulation, ultrasound, moist heat, cryotherapy, patient/family education, and DME and/or AE instructions  CONSULTED AND AGREED WITH PLAN OF CARE: Patient  PLAN FOR NEXT SESSION: Follow up on HEP, continue manual techniques, passive stretching,, A/ROM to strengthening, scapular strengthening   Ezra Sites, OTR/L  [phone] 06/15/2023, 11:56 AM

## 2023-06-18 ENCOUNTER — Ambulatory Visit (HOSPITAL_COMMUNITY): Payer: 59 | Admitting: Occupational Therapy

## 2023-06-18 ENCOUNTER — Encounter (HOSPITAL_COMMUNITY): Payer: Self-pay | Admitting: Occupational Therapy

## 2023-06-18 DIAGNOSIS — R29898 Other symptoms and signs involving the musculoskeletal system: Secondary | ICD-10-CM | POA: Diagnosis not present

## 2023-06-18 DIAGNOSIS — M25512 Pain in left shoulder: Secondary | ICD-10-CM

## 2023-06-18 NOTE — Patient Instructions (Signed)

## 2023-06-18 NOTE — Therapy (Signed)
OUTPATIENT OCCUPATIONAL THERAPY ORTHO TREATMENT  Patient Name: Allison Gould MRN: [id number] DOB:[birth year], 72 y.o., female Today's Date: 06/18/2023   END OF SESSION:  OT End of Session - 06/18/23 0906     Visit Number 14    Number of Visits 18    Date for OT Re-Evaluation 06/24/23    Authorization Type [employer]    Progress Note Due on Visit 20    OT Start Time 0823    OT Stop Time 0904    OT Time Calculation (min) 41 min    Activity Tolerance Patient tolerated treatment well    Behavior During Therapy WFL for tasks assessed/performed             Past Medical History:  Diagnosis Date   Anxiety    Diabetes mellitus type II    GERD (gastroesophageal reflux disease)    Hearing aid worn    right   Hypertension    Lipid disorder    Loss of hair    for unknown reason, to see MD    Low back pain    Morbid obesity with BMI of 45.0-49.9, adult (HCC)    Osteoarthritis    Past Surgical History:  Procedure Laterality Date   CATARACT EXTRACTION W/PHACO Left 05/12/2016   Procedure: CATARACT EXTRACTION PHACO AND INTRAOCULAR LENS PLACEMENT (IOC);  Surgeon: Gemma Payor, MD;  Location: [facility];  Service: Ophthalmology;  Laterality: Left;  CDE: 6.63   CATARACT EXTRACTION W/PHACO Right 06/16/2016   Procedure: CATARACT EXTRACTION PHACO AND INTRAOCULAR LENS PLACEMENT (IOC); CDE:  4.13;  Surgeon: Gemma Payor, MD;  Location: [facility];  Service: Ophthalmology;  Laterality: Right;   COLONOSCOPY  07/12/2012   Procedure: COLONOSCOPY;  Surgeon: West Bali, MD;  Location: [facility];  Service: Endoscopy;  Laterality: N/A;  1:00   lipoma-right shoulder     spur and nerve repair right shoulder     Patient Active Problem List   Diagnosis Date Noted   Hyperkalemia 09/17/2022   Hypernatremia 09/17/2022   Localized edema 07/23/2022   Paresthesia of hand 07/23/2022   Congestive heart failure (HCC) 07/12/2022   Chronic obstructive lung disease (HCC) 01/27/2022   Morbid obesity (HCC)  01/27/2022   Edema of lower extremity 09/30/2021   Lupus erythematosus 09/25/2021   Chronic insomnia 08/21/2021   Chronic kidney disease 06/27/2021   Thrombocytopenic disorder (HCC) 06/27/2021   Gastroesophageal reflux disease 05/24/2021   Hyperlipidemia 05/24/2021   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   Leg weakness 04/14/2013   Back pain 04/14/2013   Knee pain 04/14/2013   Pain in joint, shoulder region 03/11/2013   Muscle weakness (generalized) 03/11/2013   Pain in joint, ankle and foot 03/11/2013   Abnormality of gait 03/11/2013   Right ankle sprain 03/02/2013   Shoulder contusion 03/02/2013   Constipation 06/22/2012   Colon cancer screening 06/22/2012   Pain in limb 07/05/2009   FLANK PAIN, RIGHT 04/04/2009   ELECTROCARDIOGRAM, ABNORMAL 02/20/2009   DIABETES MELLITUS, WITH NEUROLOGICAL COMPLICATIONS 01/09/2009   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 Dwana Melena REFERRING PROVIDER: Lupita Raider, NP  ONSET DATE: ~03/16/23  REFERRING DIAG: left shoulder pain  THERAPY DIAG:  Acute pain of left shoulder  Other symptoms and signs involving the musculoskeletal system  Rationale for Evaluation and Treatment: Rehabilitation  SUBJECTIVE:   SUBJECTIVE  STATEMENT: S: I'm feeling ok, just sore.   PERTINENT HISTORY: Pt is a 72 y/o female presenting with left shoulder pain present for approximately 3 weeks. Pt goes to the chiropractor and reports no improvement. Pt reports she does not remember a precipitating event.   PRECAUTIONS: None  WEIGHT BEARING RESTRICTIONS: No  PAIN:  Are you having pain? Yes: NPRS scale: 3/10 Pain location: left shoulder Pain description: sore, aching Aggravating factors: certain movements Relieving factors: rest, Tylenol, pain cream   FALLS: Has  patient fallen in last 6 months? No  PLOF: Independent  PATIENT GOALS: To have less pin in the left arm.   NEXT MD VISIT: unsure  OBJECTIVE:   HAND DOMINANCE: Right  ADLs: Overall ADLs: Pt reports difficulty with reaching overhead, across body, and behind back. Pt has difficulty with dressing-specifically putting on a bra, bathing, fixing her hair, completing housework like pulling trash out of the can. Wakes up due to pain at night. Getting in and out of the tub is also difficult.   FUNCTIONAL OUTCOME MEASURES: FOTO: 51/100 05/21/23: 56.71  UPPER EXTREMITY ROM:       Assessed in supine, er/IR adducted  Passive ROM Left eval  Shoulder flexion 159  Shoulder abduction 158  Shoulder internal rotation 90  Shoulder external rotation 75  (Blank rows = not tested)    Assessed in sitting, er/IR adducted  Active ROM Left eval Left 05/21/23  Shoulder flexion 108 134  Shoulder abduction 81 146  Shoulder internal rotation 90 90  Shoulder external rotation 43 65  (Blank rows = not tested)    UPPER EXTREMITY MMT:     Assessed in sitting, er/IR adducted  MMT Left eval Left 05/21/23  Shoulder flexion 4-/5 4/5  Shoulder abduction 3-/5 4-/5  Shoulder internal rotation 4/5 4/5  Shoulder external rotation 3+/5 4-/5  (Blank rows = not tested)  HAND FUNCTION: Grip strength: Right: 60 lbs; Left: 35 lbs   OBSERVATIONS: mod fascial restrictions along upper arm, anterior shoulder, and trapezius regions   TODAY'S TREATMENT:                                                                                                                              DATE:   06/18/23 -A/ROM: seated-flexion, abduction, protraction, horizontal abduction, er/IR, x10 -ABC Arm in the Air -proximal shoulder strengthening: 1lb dumbbell, sitting-paddles, criss cross, circles each direction, x10 -Shoulder Strengthening: 1lb dumbbell, flexion, abduction, protraction, horizontal abduction, er/IR, x10 -X  to V arms, 1lb dumbbell, x10 -Goal Post arms, x10 -therapy ball exercises: basketball, flexion, protraction, overhead press, V up, circles both directions, x10 -PNF Strengthening: red band, chest pull, head height pull, er pull, PNF up, PNF down, x10  06/15/23 -A/ROM: seated-flexion, abduction, protraction, horizontal abduction, er/IR, x10; 2 rounds -proximal shoulder strengthening: sitting-paddles, criss cross, circles each direction, 10 reps each -x to v arms: 10 reps, 2 rounds -Red theraband: seated-row, extension, retraction, 10 reps -Red theraband: seated-protraction, flexion,  horizontal abduction, er, 10 reps -Blue kickball: overhead press, chest press, flexion, circles each direction, 10 reps -UBE: level 1, 2' forward, 2' reverse, pace: 5.0  06/04/23 -A/ROM: seated-flexion, abduction, protraction, horizontal abduction, er/IR, x10; 2 rounds -proximal shoulder strengthening: sitting-paddles, criss cross, circles each direction, 10 reps each -x to v arms: 10 reps, 2 rounds -Red theraband: seated-row, extension, retraction, 10 reps -Red theraband: seated-protraction, flexion, horizontal abduction, er, 10 reps -ABC writing: shoulder at 90 degrees flexion, no weight, 3 rest breaks -Overhead press: 10 reps -Arnold press: 10 reps    PATIENT EDUCATION: Education details: Teacher, English as a foreign language Person educated: Patient Education method: Programmer, multimedia, Facilities manager, and Handouts Education comprehension: verbalized understanding and returned demonstration  HOME EXERCISE PROGRAM: Eval: AA/ROM 6/27: A/ROM 7/2: Scapula Strengthening 7/16: X to V arms and Goal Post arms 8/15: PNF Strengthening  GOALS: Goals reviewed with patient? Yes   SHORT TERM GOALS: Target date: 04/30/23  Pt will be provided with and educated on HEP to improve mobility in LUE required for use during ADL completion.   Goal status: IN PROGRESS   2.  Pt will increase LUE strength to 4-/5 to improve ability to reach  for items at waist to chest height during bathing and grooming tasks.   Goal status: IN PROGRESS    LONG TERM GOALS: Target date: 04/21/23  Pt will decrease pain in LUE to 3/10 or less to improve ability to sleep for 2+ consecutive hours without waking due to pain.   Goal status: IN PROGRESS  2.  Pt will decrease LUE fascial restrictions to min amounts or less to improve mobility required for functional reaching tasks.   Goal status: IN PROGRESS  3.  Pt will increase LUE A/ROM by 30 degrees or greater to improve ability to use LUE when reaching overhead or behind back during dressing and bathing tasks.   Goal status: IN PROGRESS  4.  Pt will increase LUE strength to 4/5 or greater to improve ability to use LUE when lifting trash or carrying items during meal preparation/housework/yardwork tasks.   Goal status: IN PROGRESS   ASSESSMENT:  CLINICAL IMPRESSION: This session pt continues to report mild soreness and moderate fatigue. She requires frequent breaks throughout session and poor to fair movement pattern with all exercises due to weakness and fatigue. OT added PNF strengthening this session for continued mobility progression. Verbal and tactile cuing provided throughout session for positioning and technique.   PERFORMANCE DEFICITS: in functional skills including in functional skills including ADLs, IADLs, coordination, tone, ROM, strength, pain, fascial restrictions, muscle spasms, and UE functional use    PLAN:  OT FREQUENCY: 2x/week  OT DURATION: 6 weeks  PLANNED INTERVENTIONS: self care/ADL training, therapeutic exercise, therapeutic activity, neuromuscular re-education, manual therapy, passive range of motion, splinting, electrical stimulation, ultrasound, moist heat, cryotherapy, patient/family education, and DME and/or AE instructions  CONSULTED AND AGREED WITH PLAN OF CARE: Patient  PLAN FOR NEXT SESSION: Follow up on HEP, continue manual techniques, passive  stretching,, A/ROM to strengthening, scapular strengthening   Trish Mage, OTR/L [phone] 06/18/2023, 9:07 AM

## 2023-06-22 ENCOUNTER — Ambulatory Visit (HOSPITAL_COMMUNITY): Payer: 59 | Admitting: Occupational Therapy

## 2023-06-22 ENCOUNTER — Encounter (HOSPITAL_COMMUNITY): Payer: Self-pay | Admitting: Occupational Therapy

## 2023-06-22 DIAGNOSIS — M25512 Pain in left shoulder: Secondary | ICD-10-CM | POA: Diagnosis not present

## 2023-06-22 DIAGNOSIS — R29898 Other symptoms and signs involving the musculoskeletal system: Secondary | ICD-10-CM

## 2023-06-22 NOTE — Therapy (Signed)
OUTPATIENT OCCUPATIONAL THERAPY ORTHO TREATMENT  Patient Name: Allison Gould MRN: [id number] DOB:07-07-1951, 72 y.o., female Today's Date: 06/22/2023   END OF SESSION:  OT End of Session - 06/22/23 1115     Visit Number 15    Number of Visits 18    Date for OT Re-Evaluation 06/24/23    Authorization Type [employer] Medicare    Progress Note Due on Visit 20    OT Start Time 1037    OT Stop Time 1115    OT Time Calculation (min) 38 min    Activity Tolerance Patient tolerated treatment well    Behavior During Therapy WFL for tasks assessed/performed             Past Medical History:  Diagnosis Date   Anxiety    Diabetes mellitus type II    GERD (gastroesophageal reflux disease)    Hearing aid worn    right   Hypertension    Lipid disorder    Loss of hair    for unknown reason, to see MD    Low back pain    Morbid obesity with BMI of 45.0-49.9, adult (HCC)    Osteoarthritis    Past Surgical History:  Procedure Laterality Date   CATARACT EXTRACTION W/PHACO Left 05/12/2016   Procedure: CATARACT EXTRACTION PHACO AND INTRAOCULAR LENS PLACEMENT (IOC);  Surgeon: Gemma Payor, MD;  Location: [facility];  Service: Ophthalmology;  Laterality: Left;  CDE: 6.63   CATARACT EXTRACTION W/PHACO Right 06/16/2016   Procedure: CATARACT EXTRACTION PHACO AND INTRAOCULAR LENS PLACEMENT (IOC); CDE:  4.13;  Surgeon: Gemma Payor, MD;  Location: [facility];  Service: Ophthalmology;  Laterality: Right;   COLONOSCOPY  07/12/2012   Procedure: COLONOSCOPY;  Surgeon: West Bali, MD;  Location: [facility];  Service: Endoscopy;  Laterality: N/A;  1:00   lipoma-right shoulder     spur and nerve repair right shoulder     Patient Active Problem List   Diagnosis Date Noted   Hyperkalemia 09/17/2022   Hypernatremia 09/17/2022   Localized edema 07/23/2022   Paresthesia of hand 07/23/2022   Congestive heart failure (HCC) 07/12/2022   Chronic obstructive lung disease (HCC) 01/27/2022   Morbid obesity (HCC)  01/27/2022   Edema of lower extremity 09/30/2021   Lupus erythematosus 09/25/2021   Chronic insomnia 08/21/2021   Chronic kidney disease 06/27/2021   Thrombocytopenic disorder (HCC) 06/27/2021   Gastroesophageal reflux disease 05/24/2021   Hyperlipidemia 05/24/2021   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   Leg weakness 04/14/2013   Back pain 04/14/2013   Knee pain 04/14/2013   Pain in joint, shoulder region 03/11/2013   Muscle weakness (generalized) 03/11/2013   Pain in joint, ankle and foot 03/11/2013   Abnormality of gait 03/11/2013   Right ankle sprain 03/02/2013   Shoulder contusion 03/02/2013   Constipation 06/22/2012   Colon cancer screening 06/22/2012   Pain in limb 07/05/2009   FLANK PAIN, RIGHT 04/04/2009   ELECTROCARDIOGRAM, ABNORMAL 02/20/2009   DIABETES MELLITUS, WITH NEUROLOGICAL COMPLICATIONS 01/09/2009   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 Dwana Melena REFERRING PROVIDER: Lupita Raider, NP  ONSET DATE: ~03/16/23  REFERRING DIAG: left shoulder pain  THERAPY DIAG:  Other symptoms and signs involving the musculoskeletal system  Acute pain of left shoulder  Rationale for Evaluation and Treatment: Rehabilitation  SUBJECTIVE:   SUBJECTIVE  STATEMENT: S: I'm feeling ok, just sore.   PERTINENT HISTORY: Pt is a 72 y/o female presenting with left shoulder pain present for approximately 3 weeks. Pt goes to the chiropractor and reports no improvement. Pt reports she does not remember a precipitating event.   PRECAUTIONS: None  WEIGHT BEARING RESTRICTIONS: No  PAIN:  Are you having pain? Yes: NPRS scale: 3/10 Pain location: left shoulder Pain description: sore, aching Aggravating factors: certain movements Relieving factors: rest, Tylenol, pain cream   FALLS: Has  patient fallen in last 6 months? No  PLOF: Independent  PATIENT GOALS: To have less pin in the left arm.   NEXT MD VISIT: unsure  OBJECTIVE:   HAND DOMINANCE: Right  ADLs: Overall ADLs: Pt reports difficulty with reaching overhead, across body, and behind back. Pt has difficulty with dressing-specifically putting on a bra, bathing, fixing her hair, completing housework like pulling trash out of the can. Wakes up due to pain at night. Getting in and out of the tub is also difficult.   FUNCTIONAL OUTCOME MEASURES: FOTO: 51/100 05/21/23: 56.71  UPPER EXTREMITY ROM:       Assessed in supine, er/IR adducted  Passive ROM Left eval  Shoulder flexion 159  Shoulder abduction 158  Shoulder internal rotation 90  Shoulder external rotation 75  (Blank rows = not tested)    Assessed in sitting, er/IR adducted  Active ROM Left eval Left 05/21/23  Shoulder flexion 108 134  Shoulder abduction 81 146  Shoulder internal rotation 90 90  Shoulder external rotation 43 65  (Blank rows = not tested)    UPPER EXTREMITY MMT:     Assessed in sitting, er/IR adducted  MMT Left eval Left 05/21/23  Shoulder flexion 4-/5 4/5  Shoulder abduction 3-/5 4-/5  Shoulder internal rotation 4/5 4/5  Shoulder external rotation 3+/5 4-/5  (Blank rows = not tested)  HAND FUNCTION: Grip strength: Right: 60 lbs; Left: 35 lbs   OBSERVATIONS: mod fascial restrictions along upper arm, anterior shoulder, and trapezius regions   TODAY'S TREATMENT:                                                                                                                              DATE:   06/22/23 -A/ROM: seated-flexion, abduction, protraction, horizontal abduction, er/IR, x10 -ABC Arm in the Air, 1lb weight -Shoulder Strengthening:21lb dumbbell, flexion, abduction, protraction, horizontal abduction, er/IR, x10 -X to V arms, 2lb dumbbell, x10 -Goal Post arms, 1lb dumbbell, x10 -functional reaching: 6- 1lb  dumbbells, 4- 2lb dumbbells, counter to shoulder height shelf, to head height shelf, returning down in abduction -UBE: level 2, 2.5' forwards and backwards, pace 3.5+  06/18/23 -A/ROM: seated-flexion, abduction, protraction, horizontal abduction, er/IR, x10 -ABC Arm in the Air -proximal shoulder strengthening: 1lb dumbbell, sitting-paddles, criss cross, circles each direction, x10 -Shoulder Strengthening: 1lb dumbbell, flexion, abduction, protraction, horizontal abduction, er/IR, x10 -X to V arms, 1lb dumbbell, x10 -Goal Post arms, x10 -therapy ball exercises:  basketball, flexion, protraction, overhead press, V up, circles both directions, x10 -PNF Strengthening: red band, chest pull, head height pull, er pull, PNF up, PNF down, x10  06/15/23 -A/ROM: seated-flexion, abduction, protraction, horizontal abduction, er/IR, x10; 2 rounds -proximal shoulder strengthening: sitting-paddles, criss cross, circles each direction, 10 reps each -x to v arms: 10 reps, 2 rounds -Red theraband: seated-row, extension, retraction, 10 reps -Red theraband: seated-protraction, flexion, horizontal abduction, er, 10 reps -Blue kickball: overhead press, chest press, flexion, circles each direction, 10 reps -UBE: level 1, 2' forward, 2' reverse, pace: 5.0    PATIENT EDUCATION: Education details: Functional Reaching Person educated: Patient Education method: Explanation, Demonstration, and Handouts Education comprehension: verbalized understanding and returned demonstration  HOME EXERCISE PROGRAM: Eval: AA/ROM 6/27: A/ROM 7/2: Scapula Strengthening 7/16: X to V arms and Goal Post arms 8/15: PNF Strengthening  GOALS: Goals reviewed with patient? Yes   SHORT TERM GOALS: Target date: 04/30/23  Pt will be provided with and educated on HEP to improve mobility in LUE required for use during ADL completion.   Goal status: IN PROGRESS   2.  Pt will increase LUE strength to 4-/5 to improve ability to reach  for items at waist to chest height during bathing and grooming tasks.   Goal status: IN PROGRESS    LONG TERM GOALS: Target date: 04/21/23  Pt will decrease pain in LUE to 3/10 or less to improve ability to sleep for 2+ consecutive hours without waking due to pain.   Goal status: IN PROGRESS  2.  Pt will decrease LUE fascial restrictions to min amounts or less to improve mobility required for functional reaching tasks.   Goal status: IN PROGRESS  3.  Pt will increase LUE A/ROM by 30 degrees or greater to improve ability to use LUE when reaching overhead or behind back during dressing and bathing tasks.   Goal status: IN PROGRESS  4.  Pt will increase LUE strength to 4/5 or greater to improve ability to use LUE when lifting trash or carrying items during meal preparation/housework/yardwork tasks.   Goal status: IN PROGRESS   ASSESSMENT:  CLINICAL IMPRESSION: Pt continuing to demonstrate good ROM with no weight, however she struggles when adding any weight. With 1lb and 2lb dumbbells her movement pattern decreases significantly and OT instructing her to keep a lower range and controlled movement. Pt was able to complete functional reaching this session, she required a break at each shelf level, before continuing. Verbal and tactile cuing provided throughout for positioning and technique/body mechanics.   PERFORMANCE DEFICITS: in functional skills including in functional skills including ADLs, IADLs, coordination, tone, ROM, strength, pain, fascial restrictions, muscle spasms, and UE functional use    PLAN:  OT FREQUENCY: 2x/week  OT DURATION: 6 weeks  PLANNED INTERVENTIONS: self care/ADL training, therapeutic exercise, therapeutic activity, neuromuscular re-education, manual therapy, passive range of motion, splinting, electrical stimulation, ultrasound, moist heat, cryotherapy, patient/family education, and DME and/or AE instructions  CONSULTED AND AGREED WITH PLAN OF CARE:  Patient  PLAN FOR NEXT SESSION: Follow up on HEP, continue manual techniques, passive stretching,, A/ROM to strengthening, scapular strengthening, REASSESSMENT   Trish Mage, OTR/L [phone] 06/22/2023, 11:15 AM

## 2023-06-24 ENCOUNTER — Ambulatory Visit (HOSPITAL_COMMUNITY): Payer: 59 | Admitting: Occupational Therapy

## 2023-06-24 ENCOUNTER — Encounter (HOSPITAL_COMMUNITY): Payer: Self-pay | Admitting: Occupational Therapy

## 2023-06-24 DIAGNOSIS — M5442 Lumbago with sciatica, left side: Secondary | ICD-10-CM | POA: Diagnosis not present

## 2023-06-24 DIAGNOSIS — R29898 Other symptoms and signs involving the musculoskeletal system: Secondary | ICD-10-CM

## 2023-06-24 DIAGNOSIS — M9903 Segmental and somatic dysfunction of lumbar region: Secondary | ICD-10-CM | POA: Diagnosis not present

## 2023-06-24 DIAGNOSIS — M25512 Pain in left shoulder: Secondary | ICD-10-CM

## 2023-06-24 DIAGNOSIS — M9905 Segmental and somatic dysfunction of pelvic region: Secondary | ICD-10-CM | POA: Diagnosis not present

## 2023-06-24 DIAGNOSIS — M9902 Segmental and somatic dysfunction of thoracic region: Secondary | ICD-10-CM | POA: Diagnosis not present

## 2023-06-24 NOTE — Therapy (Signed)
OUTPATIENT OCCUPATIONAL THERAPY ORTHO TREATMENT  Patient Name: Allison Gould MRN: [id number] DOB:[birth year], 72 y.o., female Today's Date: 06/24/2023  OCCUPATIONAL THERAPY DISCHARGE SUMMARY  Visits from Start of Care: 16  Current functional level related to goals / functional outcomes: Pt has met all OT goals. Her ROM and strength are [facility] and she reports no difficulties with ADL's at home.    Remaining deficits: No remaining deficits.   Education / Equipment: Pt has been provided a comprehensive HEP and bands to maintain and progress her strength and mobility.    Plan: Patient agrees to discharge as all OT goals are met.     END OF SESSION:  OT End of Session - 06/24/23 1640     Visit Number 16    Number of Visits 18    Date for OT Re-Evaluation 06/24/23    Authorization Type [employer]    Progress Note Due on Visit 20    OT Start Time 1041    OT Stop Time 1110    OT Time Calculation (min) 29 min    Activity Tolerance Patient tolerated treatment well    Behavior During Therapy WFL for tasks assessed/performed             Past Medical History:  Diagnosis Date   Anxiety    Diabetes mellitus type II    GERD (gastroesophageal reflux disease)    Hearing aid worn    right   Hypertension    Lipid disorder    Loss of hair    for unknown reason, to see MD    Low back pain    Morbid obesity with BMI of 45.0-49.9, adult [facility])    Osteoarthritis    Past Surgical History:  Procedure Laterality Date   CATARACT EXTRACTION W/PHACO Left 05/12/2016   Procedure: CATARACT EXTRACTION PHACO AND INTRAOCULAR LENS PLACEMENT (IOC);  Surgeon: Gemma Payor, MD;  Location: [facility] ORS;  Service: Ophthalmology;  Laterality: Left;  CDE: 6.63   CATARACT EXTRACTION W/PHACO Right 06/16/2016   Procedure: CATARACT EXTRACTION PHACO AND INTRAOCULAR LENS PLACEMENT (IOC); CDE:  4.13;  Surgeon: Gemma Payor, MD;  Location: [facility] ORS;  Service: Ophthalmology;  Laterality: Right;   COLONOSCOPY  07/12/2012    Procedure: COLONOSCOPY;  Surgeon: West Bali, MD;  Location: [facility];  Service: Endoscopy;  Laterality: N/A;  1:00   lipoma-right shoulder     spur and nerve repair right shoulder     Patient Active Problem List   Diagnosis Date Noted   Hyperkalemia 09/17/2022   Hypernatremia 09/17/2022   Localized edema 07/23/2022   Paresthesia of hand 07/23/2022   Congestive heart failure (HCC) 07/12/2022   Chronic obstructive lung disease (HCC) 01/27/2022   Morbid obesity (HCC) 01/27/2022   Edema of lower extremity 09/30/2021   Lupus erythematosus 09/25/2021   Chronic insomnia 08/21/2021   Chronic kidney disease 06/27/2021   Thrombocytopenic disorder (HCC) 06/27/2021   Gastroesophageal reflux disease 05/24/2021   Hyperlipidemia 05/24/2021   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   Leg weakness 04/14/2013   Back pain 04/14/2013   Knee pain 04/14/2013   Pain in joint, shoulder region 03/11/2013   Muscle weakness (generalized) 03/11/2013   Pain in joint, ankle and foot 03/11/2013   Abnormality of gait 03/11/2013   Right ankle sprain 03/02/2013   Shoulder contusion 03/02/2013   Constipation 06/22/2012   Colon cancer screening 06/22/2012   Pain in limb 07/05/2009   FLANK PAIN, RIGHT 04/04/2009   ELECTROCARDIOGRAM,  ABNORMAL 02/20/2009   DIABETES MELLITUS, WITH NEUROLOGICAL COMPLICATIONS 01/09/2009   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 Dwana Melena REFERRING PROVIDER: Lupita Raider, NP  ONSET DATE: ~03/16/23  REFERRING DIAG: left shoulder pain  THERAPY DIAG:  Other symptoms and signs involving the musculoskeletal system  Acute pain of left shoulder  Rationale for Evaluation and Treatment: Rehabilitation  SUBJECTIVE:   SUBJECTIVE STATEMENT: S: My exercises been doing pretty  good.  PERTINENT HISTORY: Pt is a 72 y/o female presenting with left shoulder pain present for approximately 3 weeks. Pt goes to the chiropractor and reports no improvement. Pt reports she does not remember a precipitating event.   PRECAUTIONS: None  WEIGHT BEARING RESTRICTIONS: No  PAIN:  Are you having pain? Yes: NPRS scale: 2/10 Pain location: left shoulder Pain description: sore, aching Aggravating factors: certain movements Relieving factors: rest, Tylenol, pain cream   FALLS: Has patient fallen in last 6 months? No  PLOF: Independent  PATIENT GOALS: To have less pin in the left arm.   NEXT MD VISIT: unsure  OBJECTIVE:   HAND DOMINANCE: Right  ADLs: Overall ADLs: Pt reports difficulty with reaching overhead, across body, and behind back. Pt has difficulty with dressing-specifically putting on a bra, bathing, fixing her hair, completing housework like pulling trash out of the can. Wakes up due to pain at night. Getting in and out of the tub is also difficult.   FUNCTIONAL OUTCOME MEASURES: FOTO: 51/100 05/21/23: 56.71/100 8/21: 66.45/100  UPPER EXTREMITY ROM:       Assessed in supine, er/IR adducted  Passive ROM Left eval  Shoulder flexion 159  Shoulder abduction 158  Shoulder internal rotation 90  Shoulder external rotation 75  (Blank rows = not tested)    Assessed in sitting, er/IR adducted  Active ROM Left eval Left 05/21/23 Left 06/24/23  Shoulder flexion 108 134 163  Shoulder abduction 81 146 145  Shoulder internal rotation 90 90 90  Shoulder external rotation 43 65 60  (Blank rows = not tested)    UPPER EXTREMITY MMT:     Assessed in sitting, er/IR adducted  MMT Left eval Left 05/21/23 Left 06/24/23  Shoulder flexion 4-/5 4/5 4+/5  Shoulder abduction 3-/5 4-/5 4/5  Shoulder internal rotation 4/5 4/5 5/5  Shoulder external rotation 3+/5 4-/5 4+/5  (Blank rows = not tested)  HAND FUNCTION: Grip strength: Right: 60 lbs; Left: 35  lbs   OBSERVATIONS: mod fascial restrictions along upper arm, anterior shoulder, and trapezius regions   TODAY'S TREATMENT:                                                                                                                              DATE:   06/24/23 -measurements for Reassessment -FOTO Assessment -A/ROM: seated-flexion, abduction, protraction, horizontal abduction, er/IR, x12 -PNF strengthening: green band,  chest pull, head height pull, er pull, PNF up, PNF down, x10  06/22/23 -A/ROM: seated-flexion, abduction, protraction, horizontal abduction, er/IR, x10 -ABC Arm in the Air, 1lb weight -Shoulder Strengthening:21lb dumbbell, flexion, abduction, protraction, horizontal abduction, er/IR, x10 -X to V arms, 2lb dumbbell, x10 -Goal Post arms, 1lb dumbbell, x10 -functional reaching: 6- 1lb dumbbells, 4- 2lb dumbbells, counter to shoulder height shelf, to head height shelf, returning down in abduction -UBE: level 2, 2.5' forwards and backwards, pace 3.5+  06/18/23 -A/ROM: seated-flexion, abduction, protraction, horizontal abduction, er/IR, x10 -ABC Arm in the Air -proximal shoulder strengthening: 1lb dumbbell, sitting-paddles, criss cross, circles each direction, x10 -Shoulder Strengthening: 1lb dumbbell, flexion, abduction, protraction, horizontal abduction, er/IR, x10 -X to V arms, 1lb dumbbell, x10 -Goal Post arms, x10 -therapy ball exercises: basketball, flexion, protraction, overhead press, V up, circles both directions, x10 -PNF Strengthening: red band, chest pull, head height pull, er pull, PNF up, PNF down, x10     PATIENT EDUCATION: Education details: Continue HEP Person educated: Patient Education method: Programmer, multimedia, Facilities manager, and Handouts Education comprehension: verbalized understanding and returned demonstration  HOME EXERCISE PROGRAM: Eval: AA/ROM 6/27: A/ROM 7/2: Scapula Strengthening 7/16: X to V arms and Goal Post arms 8/15: PNF  Strengthening  GOALS: Goals reviewed with patient? Yes   SHORT TERM GOALS: Target date: 04/30/23  Pt will be provided with and educated on HEP to improve mobility in LUE required for use during ADL completion.   Goal status: MET   2.  Pt will increase LUE strength to 4-/5 to improve ability to reach for items at waist to chest height during bathing and grooming tasks.   Goal status: MET    LONG TERM GOALS: Target date: 04/21/23  Pt will decrease pain in LUE to 3/10 or less to improve ability to sleep for 2+ consecutive hours without waking due to pain.   Goal status: MET  2.  Pt will decrease LUE fascial restrictions to min amounts or less to improve mobility required for functional reaching tasks.   Goal status: MET  3.  Pt will increase LUE A/ROM by 30 degrees or greater to improve ability to use LUE when reaching overhead or behind back during dressing and bathing tasks.   Goal status: MET  4.  Pt will increase LUE strength to 4/5 or greater to improve ability to use LUE when lifting trash or carrying items during meal preparation/housework/yardwork tasks.   Goal status: MET   ASSESSMENT:  CLINICAL IMPRESSION: Pt was reassessed this session. She demonstrated greatly improved ROM and strength from beginning therapy. Pt and OT reviewed HEP and all questions and concerns were answered. Pt has no further skilled OT needs and will be discharged from OT.  PERFORMANCE DEFICITS: in functional skills including in functional skills including ADLs, IADLs, coordination, tone, ROM, strength, pain, fascial restrictions, muscle spasms, and UE functional use    PLAN:  OT FREQUENCY: 2x/week  OT DURATION: 6 weeks  PLANNED INTERVENTIONS: self care/ADL training, therapeutic exercise, therapeutic activity, neuromuscular re-education, manual therapy, passive range of motion, splinting, electrical stimulation, ultrasound, moist heat, cryotherapy, patient/family education, and DME  and/or AE instructions  CONSULTED AND AGREED WITH PLAN OF CARE: Patient  PLAN FOR NEXT SESSION: Discharge   Trish Mage, OTR/L [phone] 06/24/2023, 4:42 PM

## 2023-06-26 DIAGNOSIS — N189 Chronic kidney disease, unspecified: Secondary | ICD-10-CM | POA: Diagnosis not present

## 2023-06-29 ENCOUNTER — Encounter (HOSPITAL_COMMUNITY): Payer: 59 | Admitting: Occupational Therapy

## 2023-07-01 ENCOUNTER — Encounter (HOSPITAL_COMMUNITY): Payer: 59 | Admitting: Occupational Therapy

## 2023-07-07 ENCOUNTER — Encounter (HOSPITAL_COMMUNITY): Payer: 59 | Admitting: Occupational Therapy

## 2023-09-01 DIAGNOSIS — R809 Proteinuria, unspecified: Secondary | ICD-10-CM | POA: Diagnosis not present

## 2023-09-01 DIAGNOSIS — D631 Anemia in chronic kidney disease: Secondary | ICD-10-CM | POA: Diagnosis not present

## 2023-09-01 DIAGNOSIS — N1832 Chronic kidney disease, stage 3b: Secondary | ICD-10-CM | POA: Diagnosis not present

## 2023-09-02 DIAGNOSIS — N2581 Secondary hyperparathyroidism of renal origin: Secondary | ICD-10-CM | POA: Diagnosis not present

## 2023-09-02 DIAGNOSIS — E1149 Type 2 diabetes mellitus with other diabetic neurological complication: Secondary | ICD-10-CM | POA: Diagnosis not present

## 2023-09-02 DIAGNOSIS — E1122 Type 2 diabetes mellitus with diabetic chronic kidney disease: Secondary | ICD-10-CM | POA: Diagnosis not present

## 2023-09-02 DIAGNOSIS — N1832 Chronic kidney disease, stage 3b: Secondary | ICD-10-CM | POA: Diagnosis not present

## 2023-10-30 DIAGNOSIS — I10 Essential (primary) hypertension: Secondary | ICD-10-CM | POA: Diagnosis not present

## 2023-10-30 DIAGNOSIS — M25512 Pain in left shoulder: Secondary | ICD-10-CM | POA: Diagnosis not present

## 2023-10-30 DIAGNOSIS — N189 Chronic kidney disease, unspecified: Secondary | ICD-10-CM | POA: Diagnosis not present

## 2023-10-30 DIAGNOSIS — M79672 Pain in left foot: Secondary | ICD-10-CM | POA: Diagnosis not present

## 2023-10-30 DIAGNOSIS — R600 Localized edema: Secondary | ICD-10-CM | POA: Diagnosis not present

## 2023-10-30 DIAGNOSIS — I129 Hypertensive chronic kidney disease with stage 1 through stage 4 chronic kidney disease, or unspecified chronic kidney disease: Secondary | ICD-10-CM | POA: Diagnosis not present

## 2023-11-02 DIAGNOSIS — N189 Chronic kidney disease, unspecified: Secondary | ICD-10-CM | POA: Diagnosis not present

## 2023-11-02 DIAGNOSIS — R7301 Impaired fasting glucose: Secondary | ICD-10-CM | POA: Diagnosis not present

## 2023-11-02 DIAGNOSIS — M79672 Pain in left foot: Secondary | ICD-10-CM | POA: Diagnosis not present

## 2023-11-09 ENCOUNTER — Other Ambulatory Visit (HOSPITAL_COMMUNITY): Payer: Self-pay | Admitting: Nurse Practitioner

## 2023-11-09 DIAGNOSIS — I10 Essential (primary) hypertension: Secondary | ICD-10-CM | POA: Diagnosis not present

## 2023-11-09 DIAGNOSIS — E049 Nontoxic goiter, unspecified: Secondary | ICD-10-CM

## 2023-11-09 DIAGNOSIS — Z0001 Encounter for general adult medical examination with abnormal findings: Secondary | ICD-10-CM | POA: Diagnosis not present

## 2023-11-09 DIAGNOSIS — R7303 Prediabetes: Secondary | ICD-10-CM | POA: Diagnosis not present

## 2023-11-09 DIAGNOSIS — N189 Chronic kidney disease, unspecified: Secondary | ICD-10-CM | POA: Diagnosis not present

## 2023-11-09 DIAGNOSIS — D696 Thrombocytopenia, unspecified: Secondary | ICD-10-CM | POA: Diagnosis not present

## 2023-11-09 DIAGNOSIS — D631 Anemia in chronic kidney disease: Secondary | ICD-10-CM | POA: Diagnosis not present

## 2023-11-09 DIAGNOSIS — I129 Hypertensive chronic kidney disease with stage 1 through stage 4 chronic kidney disease, or unspecified chronic kidney disease: Secondary | ICD-10-CM | POA: Diagnosis not present

## 2023-11-09 DIAGNOSIS — L930 Discoid lupus erythematosus: Secondary | ICD-10-CM | POA: Diagnosis not present

## 2023-11-09 DIAGNOSIS — J449 Chronic obstructive pulmonary disease, unspecified: Secondary | ICD-10-CM | POA: Diagnosis not present

## 2023-11-09 DIAGNOSIS — E785 Hyperlipidemia, unspecified: Secondary | ICD-10-CM | POA: Diagnosis not present

## 2023-11-10 ENCOUNTER — Other Ambulatory Visit (HOSPITAL_COMMUNITY): Payer: Self-pay | Admitting: Nurse Practitioner

## 2023-11-10 ENCOUNTER — Encounter (HOSPITAL_COMMUNITY): Payer: Self-pay | Admitting: Family Medicine

## 2023-11-10 DIAGNOSIS — Z13820 Encounter for screening for osteoporosis: Secondary | ICD-10-CM

## 2023-11-10 DIAGNOSIS — Z1231 Encounter for screening mammogram for malignant neoplasm of breast: Secondary | ICD-10-CM

## 2023-11-18 ENCOUNTER — Encounter (INDEPENDENT_AMBULATORY_CARE_PROVIDER_SITE_OTHER): Payer: Self-pay | Admitting: *Deleted

## 2023-11-23 ENCOUNTER — Ambulatory Visit (HOSPITAL_COMMUNITY)
Admission: RE | Admit: 2023-11-23 | Discharge: 2023-11-23 | Disposition: A | Discharge status: Home / Self Care | Payer: 59 | Source: Ambulatory Visit | Attending: Nurse Practitioner | Admitting: Nurse Practitioner

## 2023-11-23 DIAGNOSIS — Z780 Asymptomatic menopausal state: Secondary | ICD-10-CM | POA: Insufficient documentation

## 2023-11-23 DIAGNOSIS — Z1231 Encounter for screening mammogram for malignant neoplasm of breast: Secondary | ICD-10-CM | POA: Diagnosis not present

## 2023-11-23 DIAGNOSIS — E042 Nontoxic multinodular goiter: Secondary | ICD-10-CM | POA: Diagnosis not present

## 2023-11-23 DIAGNOSIS — Z13820 Encounter for screening for osteoporosis: Secondary | ICD-10-CM | POA: Insufficient documentation

## 2023-11-23 DIAGNOSIS — E049 Nontoxic goiter, unspecified: Secondary | ICD-10-CM | POA: Insufficient documentation

## 2023-12-17 DIAGNOSIS — R809 Proteinuria, unspecified: Secondary | ICD-10-CM | POA: Diagnosis not present

## 2023-12-17 DIAGNOSIS — E211 Secondary hyperparathyroidism, not elsewhere classified: Secondary | ICD-10-CM | POA: Diagnosis not present

## 2023-12-17 DIAGNOSIS — D631 Anemia in chronic kidney disease: Secondary | ICD-10-CM | POA: Diagnosis not present

## 2023-12-17 DIAGNOSIS — N189 Chronic kidney disease, unspecified: Secondary | ICD-10-CM | POA: Diagnosis not present

## 2023-12-24 DIAGNOSIS — E1122 Type 2 diabetes mellitus with diabetic chronic kidney disease: Secondary | ICD-10-CM | POA: Diagnosis not present

## 2023-12-24 DIAGNOSIS — N2581 Secondary hyperparathyroidism of renal origin: Secondary | ICD-10-CM | POA: Diagnosis not present

## 2023-12-24 DIAGNOSIS — I129 Hypertensive chronic kidney disease with stage 1 through stage 4 chronic kidney disease, or unspecified chronic kidney disease: Secondary | ICD-10-CM | POA: Diagnosis not present

## 2023-12-24 DIAGNOSIS — N1832 Chronic kidney disease, stage 3b: Secondary | ICD-10-CM | POA: Diagnosis not present

## 2023-12-28 ENCOUNTER — Encounter (HOSPITAL_BASED_OUTPATIENT_CLINIC_OR_DEPARTMENT_OTHER): Payer: Self-pay | Admitting: Internal Medicine

## 2023-12-28 DIAGNOSIS — R0683 Snoring: Secondary | ICD-10-CM

## 2023-12-29 ENCOUNTER — Other Ambulatory Visit (HOSPITAL_COMMUNITY): Payer: Self-pay | Admitting: Nurse Practitioner

## 2023-12-29 ENCOUNTER — Ambulatory Visit (HOSPITAL_COMMUNITY)
Admission: RE | Admit: 2023-12-29 | Discharge: 2023-12-29 | Disposition: A | Discharge status: Home / Self Care | Payer: 59 | Source: Ambulatory Visit | Attending: Nurse Practitioner | Admitting: Nurse Practitioner

## 2023-12-29 DIAGNOSIS — M10072 Idiopathic gout, left ankle and foot: Secondary | ICD-10-CM | POA: Diagnosis not present

## 2023-12-29 DIAGNOSIS — M79672 Pain in left foot: Secondary | ICD-10-CM | POA: Insufficient documentation

## 2023-12-29 DIAGNOSIS — M7732 Calcaneal spur, left foot: Secondary | ICD-10-CM | POA: Diagnosis not present

## 2023-12-29 DIAGNOSIS — K5909 Other constipation: Secondary | ICD-10-CM | POA: Diagnosis not present

## 2023-12-29 DIAGNOSIS — R600 Localized edema: Secondary | ICD-10-CM | POA: Diagnosis not present

## 2023-12-29 DIAGNOSIS — M779 Enthesopathy, unspecified: Secondary | ICD-10-CM | POA: Diagnosis not present

## 2024-01-05 DIAGNOSIS — H5789 Other specified disorders of eye and adnexa: Secondary | ICD-10-CM | POA: Diagnosis not present

## 2024-01-05 DIAGNOSIS — L2989 Other pruritus: Secondary | ICD-10-CM | POA: Diagnosis not present

## 2024-01-05 DIAGNOSIS — L2981 Cholestatic pruritus: Secondary | ICD-10-CM | POA: Diagnosis not present

## 2024-01-07 ENCOUNTER — Encounter: Payer: Self-pay | Admitting: Podiatry

## 2024-01-07 ENCOUNTER — Ambulatory Visit (INDEPENDENT_AMBULATORY_CARE_PROVIDER_SITE_OTHER)

## 2024-01-07 ENCOUNTER — Ambulatory Visit: Payer: 59 | Admitting: Podiatry

## 2024-01-07 DIAGNOSIS — M7732 Calcaneal spur, left foot: Secondary | ICD-10-CM

## 2024-01-07 DIAGNOSIS — M109 Gout, unspecified: Secondary | ICD-10-CM | POA: Diagnosis not present

## 2024-01-07 DIAGNOSIS — M7662 Achilles tendinitis, left leg: Secondary | ICD-10-CM

## 2024-01-07 MED ORDER — TRIAMCINOLONE ACETONIDE 10 MG/ML IJ SUSP
10.0000 mg | Freq: Once | INTRAMUSCULAR | Status: AC
  Administered 2024-01-07: 10 mg via INTRA_ARTICULAR

## 2024-01-07 NOTE — Progress Notes (Signed)
 Subjective:   Patient ID: Allison Gould, female   DOB: 72 y.o.   MRN: [id number]   HPI Patient presents stating she has had a lot of pain in the back of her left heel that has come and gone but seems to be more consistent now and also is complaining of pain under the heel with obesity is a complicating factor.  Patient does not currently smoke and tries as best as can to be active   Review of Systems  All other systems reviewed and are negative.       Objective:  Physical Exam Vitals and nursing note reviewed.  Constitutional:      Appearance: She is well-developed.  Pulmonary:     Effort: Pulmonary effort is normal.  Musculoskeletal:        General: Normal range of motion.  Skin:    General: Skin is warm.  Neurological:     Mental Status: She is alert.     Neurovascular status intact muscle strength was found to be adequate range of motion of the subtalar midtarsal joint adequate.  Patient is found to have severe discomfort in the posterior aspect of the left heel at the insertional point of the tendon into the calcaneus with fluid buildup and adequate muscle strength.  Patient has moderate plantar pain also noted with obesity is complicating factor.  Good digital perfusion well oriented x 3     Assessment:  Inflammatory Achilles tendinitis left with inflammation and moderate plantar fasciitis left     Plan:  H&P x-ray reviewed discussed condition recommended careful injection explained procedure risk and patient wants to pursue this route.  I did discuss chance for rupture associated with injection and she wants to pursue this and I did sterile prep and injected carefully on the lateral side staying away from central and medial portion of the tendon with 3 mg dexamethasone Kenalog 5 mg Xylocaine and advised on reduced activity and not wearing flat shoes or barefoot  X-rays indicate spur formation both posterior and plantar heel with no indication of stress fracture calcaneus

## 2024-01-21 DIAGNOSIS — E119 Type 2 diabetes mellitus without complications: Secondary | ICD-10-CM | POA: Diagnosis not present

## 2024-02-05 ENCOUNTER — Encounter: Payer: Self-pay | Admitting: Gastroenterology

## 2024-02-05 NOTE — Progress Notes (Signed)
 Referring Provider: Micael Hampshire, FNP Primary Care Physician:  Micael Hampshire, FNP Primary Gastroenterologist:  Dr. Marletta Lor  Chief Complaint  Patient presents with   Colonoscopy    Colonoscopy screening and some constipation     HPI:   Allison Gould is a 73 y.o. female presenting today at the request of Micael Hampshire, FNP for colon cancer screening.   Last colonoscopy 07/12/2012 which showed large internal hemorrhoids, otherwise normal exam.  Recommend repeat colonoscopy in 10 years.  Today:  States that she is doing well overall.  Reports intermittent constipation that responds well to Colace or MiraLAX as needed, but then has return of constipation when she stops the medication. Occasional low volume rectal bleeding if having to strain due to known hemorrhoids.  This is a chronic, intermittent problem for her, but does not occur routinely.  No abdominal pain, nausea, vomiting, dysphagia. GERD is well controlled on omeprazole daily. No melena.   No family history of colon cancer  Past Medical History:  Diagnosis Date   Anxiety    CKD (chronic kidney disease)    COPD (chronic obstructive pulmonary disease) (HCC)    Diabetes mellitus type II    GERD (gastroesophageal reflux disease)    Gout    Hearing aid worn    right   Hyperparathyroidism (HCC)    Hypertension    Lipid disorder    Loss of hair    for unknown reason, to see MD    Low back pain    Lupus (systemic lupus erythematosus) (HCC)    Morbid obesity with BMI of 45.0-49.9, adult [facility])    Osteoarthritis     Past Surgical History:  Procedure Laterality Date   CATARACT EXTRACTION W/PHACO Left 05/12/2016   Procedure: CATARACT EXTRACTION PHACO AND INTRAOCULAR LENS PLACEMENT (IOC);  Surgeon: Gemma Payor, MD;  Location: [facility];  Service: Ophthalmology;  Laterality: Left;  CDE: 6.63   CATARACT EXTRACTION W/PHACO Right 06/16/2016   Procedure: CATARACT EXTRACTION PHACO AND INTRAOCULAR LENS PLACEMENT (IOC); CDE:   4.13;  Surgeon: Gemma Payor, MD;  Location: [facility] ORS;  Service: Ophthalmology;  Laterality: Right;   COLONOSCOPY  07/12/2012   Procedure: COLONOSCOPY;  Surgeon: West Bali, MD;  Location: [facility] END[facility];  Service: Endoscopy;  Laterality: N/A;  1:00   lipoma-right shoulder     spur and nerve repair right shoulder      Current Outpatient Medications  Medication Sig Dispense Refill   acetaminophen (TYLENOL) 500 MG tablet Take 1,000 mg by mouth every 6 (six) hours as needed for mild pain.     albuterol (VENTOLIN HFA) 108 (90 Base) MCG/ACT inhaler Inhale 2 puffs into the lungs every 6 (six) hours as needed.     amLODipine (NORVASC) 10 MG tablet Take 10 mg by mouth daily.     betamethasone dipropionate (DIPROLENE) 0.05 % cream Apply topically daily.     calcitRIOL (ROCALTROL) 0.25 MCG capsule Take 0.25 mcg by mouth daily.     Carboxymethylcellul-Glycerin 0.5-0.9 % SOLN Place 1 drop into both eyes daily as needed. Dry Eyes     cholecalciferol (VITAMIN D) 1000 UNITS tablet Take 1,000 Units by mouth daily.     docusate sodium (COLACE) 100 MG capsule Take 1 capsule (100 mg total) by mouth daily as needed for mild constipation or moderate constipation. 20 capsule 0   Fluticasone-Umeclidin-Vilant (TRELEGY ELLIPTA IN) Inhale into the lungs.     furosemide (LASIX) 40 MG tablet Take 1 tablet (40 mg total) by mouth daily. 30  tablet 11   ibuprofen (ADVIL) 800 MG tablet Take 1 tablet (800 mg total) by mouth every 8 (eight) hours as needed. 90 tablet 1   lidocaine (LIDODERM) 5 % Place 1 patch onto the skin daily. Remove & Discard patch within 12 hours or as directed by MD 30 patch 0   losartan (COZAAR) 25 MG tablet Take 25 mg by mouth daily.     nitroGLYCERIN (NITROSTAT) 0.4 MG SL tablet Place 0.4 mg under the tongue every 5 (five) minutes as needed. Chest Pain     omeprazole (PRILOSEC) 20 MG capsule Take 20 mg by mouth daily.     pravastatin (PRAVACHOL) 10 MG tablet Take 10 mg by mouth at bedtime.      pregabalin (LYRICA) 50 MG capsule TAKE ONE TABLET BY MOUTH EVERYDAY AT BEDTIME FOR nerve pain     sertraline (ZOLOFT) 25 MG tablet Take 25 mg by mouth every morning.     traMADol (ULTRAM) 50 MG tablet Take by mouth every 6 (six) hours as needed.     polyethylene glycol powder (GLYCOLAX/MIRALAX) 17 GM/SCOOP powder Take 17 g by mouth daily. Mix in 8 oz of non-carbonated beverage. 850 g 5   No current facility-administered medications for this visit.    Allergies as of 02/08/2024   (No Known Allergies)    Family History  Problem Relation Age of Onset   Lupus Sister    Colon cancer Neg Hx    Liver disease Neg Hx    Inflammatory bowel disease Neg Hx     Social History   Socioeconomic History   Marital status: Single    Spouse name: Not on file   Number of children: 1   Years of education: Not on file   Highest education level: Not on file  Occupational History   Occupation: unemployed  Tobacco Use   Smoking status: Former    Current packs/day: 0.50    Types: Cigarettes   Smokeless tobacco: Never   Tobacco comments:    quit years ago  Substance and Sexual Activity   Alcohol use: No   Drug use: No   Sexual activity: Not on file  Other Topics Concern   Not on file  Social History Narrative   Not on file   Social Drivers of Health   Financial Resource Strain: Not on file  Food Insecurity: Not on file  Transportation Needs: Not on file  Physical Activity: Not on file  Stress: Not on file  Social Connections: Not on file  Intimate Partner Violence: Not on file    Review of Systems: Gen: Denies any fever, chills, cold or flulike symptoms, presyncope, syncope. CV: Denies chest pain, heart palpitations.  Resp: Denies shortness of breath, cough. GI: See HPI GU : Denies urinary burning, urinary frequency, urinary hesitancy MS: Denies joint pain.  Derm: Denies rash.  Psych: Denies depression, anxiety. Heme: See HPI  Physical Exam: BP 136/83 (BP Location: Right Arm,  Patient Position: Sitting, Cuff Size: Large)   Pulse 75   Temp 97.8 F (36.6 C) (Temporal)   Ht 5\' 5"  (1.651 m)   Wt 299 lb 12.8 oz (136 kg)   BMI 49.89 kg/m  General:   Alert and oriented. Pleasant and cooperative. Well-nourished and well-developed.  Head:  Normocephalic and atraumatic. Eyes:  Without icterus, sclera clear and conjunctiva pink.  Ears:  Normal auditory acuity. Lungs:  Clear to auscultation bilaterally. No wheezes, rales, or rhonchi. No distress.  Heart:  S1, S2 present without murmurs  appreciated.  Abdomen:  +BS, soft, non-tender and non-distended. No HSM noted. No guarding or rebound. No masses appreciated.  Rectal:  Deferred  Msk:  Symmetrical without gross deformities. Normal posture. Extremities:  Without edema. Neurologic:  Alert and  oriented x4;  grossly normal neurologically. Skin:  Intact without significant lesions or rashes. Psych: Normal mood and affect.    Assessment:  73 year old female with history of CKD, COPD, diabetes, HTN, HLD, lupus, GERD, presenting today to discuss scheduling screening colonoscopy.  Last colonoscopy in September 2013 with large internal hemorrhoids, otherwise normal exam.  Clinically, she is doing fairly well.  She does have intermittent constipation that she is using MiraLAX or stool softeners as needed.  Notes occasional low-volume rectal bleeding related to hemorrhoids and having to strain.   Plan:  Proceed with colonoscopy with propofol by Dr. Marletta Lor in near future. The risks, benefits, and alternatives have been discussed with the patient in detail. The patient states understanding and desires to proceed.  ASA 3 MiraLAX 17 g twice daily x 7 days prior to colonoscopy Start MiraLAX 17 g daily Follow-up as needed   Ermalinda Memos, Cordelia Poche [facility] 02/08/2024

## 2024-02-08 ENCOUNTER — Telehealth: Payer: Self-pay | Admitting: *Deleted

## 2024-02-08 ENCOUNTER — Encounter: Payer: Self-pay | Admitting: Gastroenterology

## 2024-02-08 ENCOUNTER — Ambulatory Visit (INDEPENDENT_AMBULATORY_CARE_PROVIDER_SITE_OTHER): Admitting: Gastroenterology

## 2024-02-08 VITALS — BP 136/83 | HR 75 | Temp 97.8°F | Ht 65.0 in | Wt 299.8 lb

## 2024-02-08 DIAGNOSIS — K625 Hemorrhage of anus and rectum: Secondary | ICD-10-CM

## 2024-02-08 DIAGNOSIS — K648 Other hemorrhoids: Secondary | ICD-10-CM

## 2024-02-08 DIAGNOSIS — K5900 Constipation, unspecified: Secondary | ICD-10-CM

## 2024-02-08 DIAGNOSIS — Z1211 Encounter for screening for malignant neoplasm of colon: Secondary | ICD-10-CM

## 2024-02-08 MED ORDER — POLYETHYLENE GLYCOL 3350 17 GM/SCOOP PO POWD: 17.0000 g | Freq: Every day | ORAL | 5 refills | Status: AC

## 2024-02-08 NOTE — Patient Instructions (Signed)
 We will get you scheduled for a colonoscopy in the near future with Dr. Marletta Lor.  I am sending in a prescription of Zofran 4 mg for you to use every 8 hours while you are prepping for your colonoscopy to prevent nausea/vomiting.  For constipation, start MiraLAX 17 g daily. I have sent a prescription to [facility].  I will see you back in the office as needed.  It was nice to meet you today!  Ermalinda Memos, PA-C [facility]astroenter[facility]

## 2024-02-08 NOTE — Telephone Encounter (Signed)
 LMTCB to call back to schedule TCS with Dr. Marletta Lor, ASA 3, miralax BID x 7 days prior

## 2024-02-15 DIAGNOSIS — N189 Chronic kidney disease, unspecified: Secondary | ICD-10-CM | POA: Diagnosis not present

## 2024-02-15 DIAGNOSIS — R7301 Impaired fasting glucose: Secondary | ICD-10-CM | POA: Diagnosis not present

## 2024-02-17 ENCOUNTER — Encounter: Payer: Self-pay | Admitting: *Deleted

## 2024-02-17 MED ORDER — PEG 3350-KCL-NA BICARB-NACL 420 G PO SOLR
4000.0000 mL | Freq: Once | ORAL | 0 refills | Status: AC
Start: 2024-02-17 — End: 2024-02-17

## 2024-02-17 NOTE — Telephone Encounter (Signed)
 Spoke with pt and is aware of pre-op appt

## 2024-02-17 NOTE — Telephone Encounter (Signed)
 Spoke with pt. She has been scheduled for 5/19. Aware will send prep rx to [facility]. Instructions to be mailed (confirmed po box). Will call back with pre-op appt

## 2024-02-18 DIAGNOSIS — E870 Hyperosmolality and hypernatremia: Secondary | ICD-10-CM | POA: Diagnosis not present

## 2024-02-18 DIAGNOSIS — I10 Essential (primary) hypertension: Secondary | ICD-10-CM | POA: Diagnosis not present

## 2024-02-18 DIAGNOSIS — E875 Hyperkalemia: Secondary | ICD-10-CM | POA: Diagnosis not present

## 2024-02-18 DIAGNOSIS — D696 Thrombocytopenia, unspecified: Secondary | ICD-10-CM | POA: Diagnosis not present

## 2024-02-18 DIAGNOSIS — I129 Hypertensive chronic kidney disease with stage 1 through stage 4 chronic kidney disease, or unspecified chronic kidney disease: Secondary | ICD-10-CM | POA: Diagnosis not present

## 2024-02-18 DIAGNOSIS — M170 Bilateral primary osteoarthritis of knee: Secondary | ICD-10-CM | POA: Diagnosis not present

## 2024-02-18 DIAGNOSIS — D631 Anemia in chronic kidney disease: Secondary | ICD-10-CM | POA: Diagnosis not present

## 2024-02-18 DIAGNOSIS — N189 Chronic kidney disease, unspecified: Secondary | ICD-10-CM | POA: Diagnosis not present

## 2024-02-18 DIAGNOSIS — R7303 Prediabetes: Secondary | ICD-10-CM | POA: Diagnosis not present

## 2024-02-18 DIAGNOSIS — J449 Chronic obstructive pulmonary disease, unspecified: Secondary | ICD-10-CM | POA: Diagnosis not present

## 2024-02-18 DIAGNOSIS — E785 Hyperlipidemia, unspecified: Secondary | ICD-10-CM | POA: Diagnosis not present

## 2024-02-29 ENCOUNTER — Telehealth: Payer: Self-pay

## 2024-02-29 NOTE — Progress Notes (Signed)
   02/29/2024  Patient ID: Allison Gould, female   DOB: 09-06-1951, 73 y.o.   MRN: [id number]   Patient appeared on insurance report for not passing the quality metrics in 2024:  Medication Adherence for Hypertension [facility])   Outreach to the patient was not needed today.  Meds Tracking:  -Losartan 25 mg - Last fill 90DS on 02/27/24, BP 138/80 on 02/18/24. Patient qualifies for metric, next fill due 05/27/24. Failed last year due to switch from lisinopril to losartan with large enough gap between fills.   -Pravastatin 10 mg - Last fill 30DS on 02/02/24, LDL 79 on 02/15/24. Patient qualifies for metric, next fill due 03/03/24.  Plan:  Review fill history on 03/07/24, outreach if no pravastatin fill.  Flint Hummer, PharmD

## 2024-03-07 ENCOUNTER — Telehealth: Payer: Self-pay

## 2024-03-07 NOTE — Progress Notes (Unsigned)
   03/07/2024  Patient ID: Pat Bonier, female   DOB: 1951/01/08, 73 y.o.   MRN: [id number]   Adherence Monitoring  Left voicemail on 03/07/24, successful outreach on 03/08/24. Unable to verify fill history via EMR. Patient confirmed she has been getting 30DS from [facility] every month. No issues with deliveries. She has not shown up on the at-risk list this year, will continue to monitor at-risk list and outreach if needed.    Flint Hummer, PharmD

## 2024-03-11 ENCOUNTER — Ambulatory Visit (HOSPITAL_BASED_OUTPATIENT_CLINIC_OR_DEPARTMENT_OTHER): Attending: Nurse Practitioner | Admitting: Internal Medicine

## 2024-03-11 VITALS — Ht 65.0 in | Wt 296.0 lb

## 2024-03-11 DIAGNOSIS — R0683 Snoring: Secondary | ICD-10-CM | POA: Insufficient documentation

## 2024-03-11 DIAGNOSIS — M25569 Pain in unspecified knee: Secondary | ICD-10-CM | POA: Insufficient documentation

## 2024-03-11 DIAGNOSIS — Z7409 Other reduced mobility: Secondary | ICD-10-CM | POA: Insufficient documentation

## 2024-03-11 DIAGNOSIS — G4700 Insomnia, unspecified: Secondary | ICD-10-CM | POA: Insufficient documentation

## 2024-03-11 DIAGNOSIS — R351 Nocturia: Secondary | ICD-10-CM | POA: Diagnosis not present

## 2024-03-11 DIAGNOSIS — R0681 Apnea, not elsewhere classified: Secondary | ICD-10-CM | POA: Insufficient documentation

## 2024-03-16 NOTE — Patient Instructions (Signed)
 Allison Gould  03/16/2024     @PREFPERIOPPHARMACY @   Your procedure is scheduled on  03/21/2024.   Report to [facility] at  0830  A.M.   Call this number if you have problems the morning of surgery:  [phone]  If you experience any cold or flu symptoms such as cough, fever, chills, shortness of breath, etc. between now and your scheduled surgery, please notify us  at the above number.   Remember:         Use your inhaler before you come and bring your rescue inhaler with you.   Follow the diet and prep instructions given to you by the office.   You may drink clear liquids until 0630 am on 03/21/2024.    Clear liquids allowed are:                    Water , Juice (No red color; non-citric and without pulp; diabetics please choose diet or no sugar options), Carbonated beverages (diabetics please choose diet or no sugar options), Clear Tea (No creamer, milk, or cream, including half & half and powdered creamer), Black Coffee Only (No creamer, milk or cream, including half & half and powdered creamer), and Clear Sports drink (No red color; diabetics please choose diet or no sugar options)    Take these medicines the morning of surgery with A SIP OF WATER             amlodipine, omeprazole, sertraline, tramadol (if needed).    Do not wear jewelry, make-up or nail polish, including gel polish,  artificial nails, or any other type of covering on natural nails (fingers and  toes).  Do not wear lotions, powders, or perfumes, or deodorant.  Do not shave 48 hours prior to surgery.  Men may shave face and neck.  Do not bring valuables to the hospital.  [facility] is not responsible for any belongings or valuables.  Contacts, dentures or bridgework may not be worn into surgery.  Leave your suitcase in the car.  After surgery it may be brought to your room.  For patients admitted to the hospital, discharge time will be determined by your treatment team.  Patients discharged the day  of surgery will not be allowed to drive home and must have someone with them for 24 hours.    Special instructions:  DO NOT smoke tobacco or vape for 24 hours before your procedure.  Please read over the following fact sheets that you were given. Anesthesia Post-op Instructions and Care and Recovery After Surgery      Colonoscopy, Adult, Care After The following information offers guidance on how to care for yourself after your procedure. Your health care provider may also give you more specific instructions. If you have problems or questions, contact your health care provider. What can I expect after the procedure? After the procedure, it is common to have: A small amount of blood in your stool for 24 hours after the procedure. Some gas. Mild cramping or bloating of your abdomen. Follow these instructions at home: Eating and drinking  Drink enough fluid to keep your urine pale yellow. Follow instructions from your health care provider about eating or drinking restrictions. Resume your normal diet as told by your health care provider. Avoid heavy or fried foods that are hard to digest. Activity Rest as told by your health care provider. Avoid sitting for a long time without moving. Get up to take short walks every 1-2  hours. This is important to improve blood flow and breathing. Ask for help if you feel weak or unsteady. Return to your normal activities as told by your health care provider. Ask your health care provider what activities are safe for you. Managing cramping and bloating  Try walking around when you have cramps or feel bloated. If directed, apply heat to your abdomen as told by your health care provider. Use the heat source that your health care provider recommends, such as a moist heat pack or a heating pad. Place a towel between your skin and the heat source. Leave the heat on for 20-30 minutes. Remove the heat if your skin turns bright red. This is especially  important if you are unable to feel pain, heat, or cold. You have a greater risk of getting burned. General instructions If you were given a sedative during the procedure, it can affect you for several hours. Do not drive or operate machinery until your health care provider says that it is safe. For the first 24 hours after the procedure: Do not sign important documents. Do not drink alcohol. Do your regular daily activities at a slower pace than normal. Eat soft foods that are easy to digest. Take over-the-counter and prescription medicines only as told by your health care provider. Keep all follow-up visits. This is important. Contact a health care provider if: You have blood in your stool 2-3 days after the procedure. Get help right away if: You have more than a small spotting of blood in your stool. You have large blood clots in your stool. You have swelling of your abdomen. You have nausea or vomiting. You have a fever. You have increasing pain in your abdomen that is not relieved with medicine. These symptoms may be an emergency. Get help right away. Call 911. Do not wait to see if the symptoms will go away. Do not drive yourself to the hospital. Summary After the procedure, it is common to have a small amount of blood in your stool. You may also have mild cramping and bloating of your abdomen. If you were given a sedative during the procedure, it can affect you for several hours. Do not drive or operate machinery until your health care provider says that it is safe. Get help right away if you have a lot of blood in your stool, nausea or vomiting, a fever, or increased pain in your abdomen. This information is not intended to replace advice given to you by your health care provider. Make sure you discuss any questions you have with your health care provider. Document Revised: 12/02/2022 Document Reviewed: 06/12/2021 Elsevier Patient Education  2024 [employer]General Anesthesia,  Adult, Care After The following information offers guidance on how to care for yourself after your procedure. Your health care provider may also give you more specific instructions. If you have problems or questions, contact your health care provider. What can I expect after the procedure? After the procedure, it is common for people to: Have pain or discomfort at the IV site. Have nausea or vomiting. Have a sore throat or hoarseness. Have trouble concentrating. Feel cold or chills. Feel weak, sleepy, or tired (fatigue). Have soreness and body aches. These can affect parts of the body that were not involved in surgery. Follow these instructions at home: For the time period you were told by your health care provider:  Rest. Do not participate in activities where you could fall or become injured. Do not drive or use machinery.  Do not drink alcohol. Do not take sleeping pills or medicines that cause drowsiness. Do not make important decisions or sign legal documents. Do not take care of children on your own. General instructions Drink enough fluid to keep your urine pale yellow. If you have sleep apnea, surgery and certain medicines can increase your risk for breathing problems. Follow instructions from your health care provider about wearing your sleep device: Anytime you are sleeping, including during daytime naps. While taking prescription pain medicines, sleeping medicines, or medicines that make you drowsy. Return to your normal activities as told by your health care provider. Ask your health care provider what activities are safe for you. Take over-the-counter and prescription medicines only as told by your health care provider. Do not use any products that contain nicotine or tobacco. These products include cigarettes, chewing tobacco, and vaping devices, such as e-cigarettes. These can delay incision healing after surgery. If you need help quitting, ask your health care  provider. Contact a health care provider if: You have nausea or vomiting that does not get better with medicine. You vomit every time you eat or drink. You have pain that does not get better with medicine. You cannot urinate or have bloody urine. You develop a skin rash. You have a fever. Get help right away if: You have trouble breathing. You have chest pain. You vomit blood. These symptoms may be an emergency. Get help right away. Call 911. Do not wait to see if the symptoms will go away. Do not drive yourself to the hospital. Summary After the procedure, it is common to have a sore throat, hoarseness, nausea, vomiting, or to feel weak, sleepy, or fatigue. For the time period you were told by your health care provider, do not drive or use machinery. Get help right away if you have difficulty breathing, have chest pain, or vomit blood. These symptoms may be an emergency. This information is not intended to replace advice given to you by your health care provider. Make sure you discuss any questions you have with your health care provider. Document Revised: 01/17/2022 Document Reviewed: 01/17/2022 Elsevier Patient Education  2024 [employer].

## 2024-03-17 ENCOUNTER — Encounter (HOSPITAL_COMMUNITY)
Admission: RE | Admit: 2024-03-17 | Discharge: 2024-03-17 | Disposition: A | Discharge status: Home / Self Care | Source: Ambulatory Visit | Attending: Internal Medicine | Admitting: Internal Medicine

## 2024-03-17 ENCOUNTER — Encounter (HOSPITAL_COMMUNITY): Payer: Self-pay

## 2024-03-17 ENCOUNTER — Other Ambulatory Visit: Payer: Self-pay

## 2024-03-17 VITALS — BP 116/54 | HR 55 | Temp 98.0°F | Resp 18 | Ht 65.0 in | Wt 296.0 lb

## 2024-03-17 DIAGNOSIS — E1122 Type 2 diabetes mellitus with diabetic chronic kidney disease: Secondary | ICD-10-CM | POA: Insufficient documentation

## 2024-03-17 DIAGNOSIS — D696 Thrombocytopenia, unspecified: Secondary | ICD-10-CM | POA: Diagnosis not present

## 2024-03-17 DIAGNOSIS — N189 Chronic kidney disease, unspecified: Secondary | ICD-10-CM | POA: Diagnosis not present

## 2024-03-17 DIAGNOSIS — I129 Hypertensive chronic kidney disease with stage 1 through stage 4 chronic kidney disease, or unspecified chronic kidney disease: Secondary | ICD-10-CM | POA: Insufficient documentation

## 2024-03-17 DIAGNOSIS — I10 Essential (primary) hypertension: Secondary | ICD-10-CM

## 2024-03-17 DIAGNOSIS — E1149 Type 2 diabetes mellitus with other diabetic neurological complication: Secondary | ICD-10-CM | POA: Insufficient documentation

## 2024-03-17 LAB — BASIC METABOLIC PANEL WITH GFR
Anion gap: 8 (ref 5–15)
BUN: 20 mg/dL (ref 8–23)
CO2: 22 mmol/L (ref 22–32)
Calcium: 9.3 mg/dL (ref 8.9–10.3)
Chloride: 107 mmol/L (ref 98–111)
Creatinine, Ser: 1.55 mg/dL — ABNORMAL HIGH (ref 0.44–1.00)
GFR, Estimated: 35 mL/min — ABNORMAL LOW (ref >60)
Glucose, Bld: 86 mg/dL (ref 70–99)
Potassium: 4.5 mmol/L (ref 3.5–5.1)
Sodium: 137 mmol/L (ref 135–145)

## 2024-03-17 LAB — CBC WITH DIFFERENTIAL/PLATELET
Abs Immature Granulocytes: 0.01 10*3/uL (ref 0.00–0.07)
Basophils Absolute: 0 10*3/uL (ref 0.0–0.1)
Basophils Relative: 0 %
Eosinophils Absolute: 0.1 10*3/uL (ref 0.0–0.5)
Eosinophils Relative: 2 %
HCT: 35.3 % — ABNORMAL LOW (ref 36.0–46.0)
Hemoglobin: 11.6 g/dL — ABNORMAL LOW (ref 12.0–15.0)
Immature Granulocytes: 0 %
Lymphocytes Relative: 32 %
Lymphs Abs: 1.2 10*3/uL (ref 0.7–4.0)
MCH: 31.4 pg (ref 26.0–34.0)
MCHC: 32.9 g/dL (ref 30.0–36.0)
MCV: 95.7 fL (ref 80.0–100.0)
Monocytes Absolute: 0.4 10*3/uL (ref 0.1–1.0)
Monocytes Relative: 11 %
Neutro Abs: 2 10*3/uL (ref 1.7–7.7)
Neutrophils Relative %: 55 %
Platelets: 151 10*3/uL (ref 150–400)
RBC: 3.69 MIL/uL — ABNORMAL LOW (ref 3.87–5.11)
RDW: 14.1 % (ref 11.5–15.5)
WBC: 3.8 10*3/uL — ABNORMAL LOW (ref 4.0–10.5)
nRBC: 0 % (ref 0.0–0.2)

## 2024-03-20 DIAGNOSIS — R0683 Snoring: Secondary | ICD-10-CM | POA: Diagnosis not present

## 2024-03-20 NOTE — Procedures (Signed)
 [facility] [address] [place], [place] [address] Tel: [phone]   Fax: [phone]  Polysomnography Interpretation  Patient Name:  Allison Gould, Allison Gould Date:  03/11/2024 Referring Physician:  Harriet Limber, FNP  Indications for Polysomnography The patient is a 73 year-old Female who is 5\' 5"  and weighs 299.0 lbs. Her BMI equals 49.8.  A full night polysomnogram was performed to evaluate for OSA-.  Medication  Tylenol    Polysomnogram Data A full night polysomnogram recorded the standard physiologic parameters including EEG, EOG, EMG, EKG, nasal and oral airflow.  Respiratory parameters of chest and abdominal movements were recorded with Respiratory Inductance Plethysmography belts.  Oxygen saturation was recorded by pulse oximetry.   Sleep Architecture The total recording time of the polysomnogram was 373.6 minutes.  The total sleep time was 78.5 minutes.  The patient spent 2.5% of total sleep time in Stage N1, 97.5% in Stage N2, 0.0% in Stages N3, and 0.0% in REM.  Sleep latency was 57.9 minutes.  REM latency was - minutes.  Sleep Efficiency was 21.0%.  Wake after Sleep Onset time was 237.0 minutes.  Respiratory Events The polysomnogram revealed a presence of - obstructive, - central, and - mixed apneas resulting in an Apnea index of - events per hour.  There were 13 hypopneas (>=3% desaturation and/or arousal) resulting in an Apnea\Hypopnea Index (AHI >=3% desaturation and/or arousal) of 9.9 events per hour.  There were 6 hypopneas (>=4% desaturation) resulting in an Apnea\Hypopnea Index (AHI >=4% desaturation) of 4.6 events per hour.  There were - Respiratory Effort Related Arousals resulting in a RERA index of - events per hour. The Respiratory Disturbance Index is 9.9 events per hour.  The snore index was - events per hour.  Mean oxygen saturation was 96.2%.  The lowest oxygen saturation during sleep was 91.0%.  Time spent <=88%  oxygen saturation was - minutes (-).  End Tidal CO2 during sleep ranged from - to - mmHg. End Tidal CO2 was greater than 50 mmHg for - minutes and greater than 55 mmHg for - minutes.  Limb Activity There were - total limb movements recorded, of this total, - were classified as PLMs.  PLM index was - per hour and PLM associated with Arousals index was - per hour.  Cardiac Summary The average pulse rate was 58.4 bpm.  The minimum pulse rate was 44.0 bpm while the maximum pulse rate was 107.0 bpm.  Cardiac rhythm was normal.  Comment: Patient had significant difficulty initiating and maintaining sleep, associated with knee pain, limited mobility, bathroom x 4. Occasional apneas and hypopneas, within normal limits, AHI (4%) 4.6/hr. Snoring with oxygen desaturation to a nadir of 91%, mean 96.2%. See tech comments at end of report.  Diagnosis: Normal study,    insomnia  Recommendations: Manage for comfort and insomnia. Consider repeat sleep study or home sleep test in the future if concern remains.   This study was personally reviewed and electronically signed by: [employer], MD Accredited Board Certified in Sleep Medicine Date/Time: 03/20/24   12:48        Diagnostic PSG Report  Patient Name: Allison Gould, Allison Gould Date: 03/11/2024  Date of Birth: 12-29-1950 Study Type: Diagnostic  Age: 40 year MRN #: [id number]  Sex: Female Interpreting Physician: [employer] [id number]  Height: 5\' 5"  Referring Physician: Harriet Limber, FNP  Weight: 299.0 lbs Recording Tech: Adel Holt McConnico RPSGT RST  BMI: 49.8 Scoring Tech: Oleh Berliner RPSGT RST  ESS: 8 Neck Size: 16   Study Overview  Lights Off: 10:20:46 PM  Count Index  Lights On: 04:34:23 AM Awakenings: 16 12.2  Time in Bed: 373.6 min. Arousals: 1 0.8  Total Sleep Time: 78.5 min. AHI (>=3% Desat and/or Ar.): 13 9.9   Sleep Efficiency: 21.0% AHI (>=4% Desat): 6 4.6   Sleep Latency: 57.9 min. Limb Movements: - -  Wake After Sleep  Onset: 237.0 min. Snore: - -  REM Latency from Sleep Onset: - min. Desaturations: 37 28.3     Minimum SpO2 TST: 91.0%    Sleep Architecture  % of Time in Bed Stages Time (mins) % Sleep Time  Wake 295.5   Stage N1 2.0 2.5%  Stage N2 76.5 97.5%  Stage N3 0.0 0.0%  REM 0.0 0.0%   Arousal Summary   NREM REM Sleep Index  Respiratory Arousals - - - -  PLM Arousals - - - -  Isolated Limb Movement Arousals - - - -  Snore Arousals - - - -  Spontaneous Arousals 1 - 1 0.8  Total 1 - 1 0.8   Limb Movement Summary   Count Index  Isolated Limb Movements - -  Periodic Limb Movements (PLMs) - -  Total Limb Movements - -    Respiratory Summary   By Sleep Stage By Body Position Total   NREM REM Supine Non-Supine   Time (min) 78.5 0.0 78.5 - 78.5         Obstructive Apnea - - - - -  Mixed Apnea - - - - -  Central Apnea - - - - -  Total Apneas - - - - -  Total Apnea Index - - - - -         Hypopneas (>=3% Desat and/or Ar.) 13 - 13 - 13  AHI (>=3% Desat and/or Ar.) 9.9 - 9.9 - 9.9         Hypopneas (>=4% Desat) 6 - 6 - 6  AHI (>=4% Desat) 4.6 - 4.6 - 4.6          RERAs - - - - -  RERA Index - - - - -         RDI 9.9 - 9.9 - 9.9    Respiratory Event Type Index  Central Apneas -  Obstructive Apneas -  Mixed Apneas -  Central Hypopneas -  Obstructive Hypopneas 10.7  Central Apnea + Hypopnea (CAHI) -  Obstructive Apnea + Hypopnea (OAHI) 10.7   Respiratory Event Durations   Apnea Hypopnea   NREM REM NREM REM  Average (seconds) - - 17.1 -  Maximum (seconds) - - 23.1 -    Oxygen Saturation Summary   Wake NREM REM TST TIB  Average SpO2 (%) 96.6% 95.0% - 95.0% 96.2%  Minimum SpO2 (%) 89.0% 91.0% - 91.0% 89.0%  Maximum SpO2 (%) 99.0% 98.0% - 98.0% 99.0%   Oxygen Saturation Distribution  Range (%) Time in range (min) Time in range (%)  90.0 - 100.0 333.8 99.9%  80.0 - 90.0 0.3 0.1%  70.0 - 80.0 - -  60.0 - 70.0 - -  50.0 - 60.0 - -  0.0 - 50.0 - -  Time Spent  <=88% SpO2  Range (%) Time in range (min) Time in range (%)  0.0 - 88.0 - -      Count Index  Desaturations 37 28.3    Cardiac Summary   Wake NREM REM Sleep Total  Average Pulse Rate (BPM) 58.6  57.8 - 57.8 58.4  Minimum Pulse Rate (BPM) 44.0 52.0 - 52.0 44.0  Maximum Pulse Rate (BPM) 107.0 95.0 - 95.0 107.0   Pulse Rate Distribution:  Range (bpm) Time in range (min) Time in range (%)  0.0 - 40.0 - -  40.0 - 60.0 268.0 80.2%  60.0 - 80.0 63.3 18.9%  80.0 - 100.0 2.5 0.8%  100.0 - 120.0 0.3 0.1%  120.0 - 140.0 - -  140.0 - 200.0 - -   EtCO2 Summary  Stage Min (mmHg) Average (mmHg) Max (mmHg)  Wake - - -  NREM(1+2+3) - - -  REM - - -   EtCO2 Distribution:  Range (mmHg) Time in range (min) Time in range (%)  20.0 - 40.0 - -  40.0 - 50.0 - -  50.0 - 100.0 - -  55.0 - 100.0 - -  Excluded data <20.0 & >65.0 374.0 100.0%     Hypnograms                         Technologist Comments  Pt arrived in a wheelchair.  She has a very bad left knee and uses a cane to help her get around She did not sleep very well.  She says that happen sometimes.  Her knee hurts her in bed often. She made 3 trips to the restroom. I did walk with her and wait on her. I did observe some hypopneas.  Only once did I hear her snore.  She dosed but did not stay asleep. I did not observe PLMS with or without arousals. When she finally went to sleep soundly, she looked very good when she actually went to sleep. She also made another trip to bathroom just before getting her up. She does sleep with her mouth open but not all the time.                        [employer] Diplomate, Biomedical engineer of Sleep Medicine  ELECTRONICALLY SIGNED ON:  03/20/2024, 12:42 PM [facility] PH: [phone]   FX: [phone] ACCREDITED BY THE AMERICAN ACADEMY OF SLEEP MEDICINE

## 2024-03-21 ENCOUNTER — Ambulatory Visit (HOSPITAL_COMMUNITY): Admitting: Anesthesiology

## 2024-03-21 ENCOUNTER — Encounter (HOSPITAL_COMMUNITY): Payer: Self-pay | Admitting: Internal Medicine

## 2024-03-21 ENCOUNTER — Encounter (HOSPITAL_COMMUNITY)
Admission: RE | Disposition: A | Discharge status: Home / Self Care | Payer: Self-pay | Source: Home / Self Care | Attending: Internal Medicine

## 2024-03-21 ENCOUNTER — Ambulatory Visit (HOSPITAL_COMMUNITY)
Admission: RE | Admit: 2024-03-21 | Discharge: 2024-03-21 | Disposition: A | Discharge status: Home / Self Care | Attending: Internal Medicine | Admitting: Internal Medicine

## 2024-03-21 DIAGNOSIS — K5730 Diverticulosis of large intestine without perforation or abscess without bleeding: Secondary | ICD-10-CM | POA: Insufficient documentation

## 2024-03-21 DIAGNOSIS — D123 Benign neoplasm of transverse colon: Secondary | ICD-10-CM | POA: Diagnosis not present

## 2024-03-21 DIAGNOSIS — J449 Chronic obstructive pulmonary disease, unspecified: Secondary | ICD-10-CM | POA: Insufficient documentation

## 2024-03-21 DIAGNOSIS — K635 Polyp of colon: Secondary | ICD-10-CM | POA: Diagnosis not present

## 2024-03-21 DIAGNOSIS — K219 Gastro-esophageal reflux disease without esophagitis: Secondary | ICD-10-CM | POA: Insufficient documentation

## 2024-03-21 DIAGNOSIS — Z1211 Encounter for screening for malignant neoplasm of colon: Secondary | ICD-10-CM | POA: Insufficient documentation

## 2024-03-21 DIAGNOSIS — I509 Heart failure, unspecified: Secondary | ICD-10-CM | POA: Insufficient documentation

## 2024-03-21 DIAGNOSIS — K648 Other hemorrhoids: Secondary | ICD-10-CM | POA: Insufficient documentation

## 2024-03-21 DIAGNOSIS — Z87891 Personal history of nicotine dependence: Secondary | ICD-10-CM | POA: Diagnosis not present

## 2024-03-21 DIAGNOSIS — I130 Hypertensive heart and chronic kidney disease with heart failure and stage 1 through stage 4 chronic kidney disease, or unspecified chronic kidney disease: Secondary | ICD-10-CM | POA: Insufficient documentation

## 2024-03-21 DIAGNOSIS — M329 Systemic lupus erythematosus, unspecified: Secondary | ICD-10-CM | POA: Diagnosis not present

## 2024-03-21 DIAGNOSIS — E1122 Type 2 diabetes mellitus with diabetic chronic kidney disease: Secondary | ICD-10-CM | POA: Insufficient documentation

## 2024-03-21 DIAGNOSIS — I110 Hypertensive heart disease with heart failure: Secondary | ICD-10-CM | POA: Diagnosis not present

## 2024-03-21 DIAGNOSIS — Z129 Encounter for screening for malignant neoplasm, site unspecified: Secondary | ICD-10-CM | POA: Diagnosis not present

## 2024-03-21 DIAGNOSIS — N189 Chronic kidney disease, unspecified: Secondary | ICD-10-CM | POA: Diagnosis not present

## 2024-03-21 HISTORY — PX: COLONOSCOPY: SHX5424

## 2024-03-21 LAB — GLUCOSE, CAPILLARY: Glucose-Capillary: 96 mg/dL (ref 70–99)

## 2024-03-21 SURGERY — COLONOSCOPY
Anesthesia: General

## 2024-03-21 MED ORDER — PROPOFOL 500 MG/50ML IV EMUL
INTRAVENOUS | Status: DC | PRN
  Administered 2024-03-21: 200 ug/kg/min via INTRAVENOUS

## 2024-03-21 MED ORDER — LACTATED RINGERS IV SOLN: INTRAVENOUS | Status: DC | PRN

## 2024-03-21 MED ORDER — PROPOFOL 10 MG/ML IV BOLUS
INTRAVENOUS | Status: DC | PRN
  Administered 2024-03-21: 100 mg via INTRAVENOUS

## 2024-03-21 NOTE — Discharge Instructions (Addendum)
  Colonoscopy Discharge Instructions  Read the instructions outlined below and refer to this sheet in the next few weeks. These discharge instructions provide you with general information on caring for yourself after you leave the hospital. Your doctor may also give you specific instructions. While your treatment has been planned according to the most current medical practices available, unavoidable complications occasionally occur.   ACTIVITY You may resume your regular activity, but move at a slower pace for the next 24 hours.  Take frequent rest periods for the next 24 hours.  Walking will help get rid of the air and reduce the bloated feeling in your belly (abdomen).  No driving for 24 hours (because of the medicine (anesthesia) used during the test).   Do not sign any important legal documents or operate any machinery for 24 hours (because of the anesthesia used during the test).  NUTRITION Drink plenty of fluids.  You may resume your normal diet as instructed by your doctor.  Begin with a light meal and progress to your normal diet. Heavy or fried foods are harder to digest and may make you feel sick to your stomach (nauseated).  Avoid alcoholic beverages for 24 hours or as instructed.  MEDICATIONS You may resume your normal medications unless your doctor tells you otherwise.  WHAT YOU CAN EXPECT TODAY Some feelings of bloating in the abdomen.  Passage of more gas than usual.  Spotting of blood in your stool or on the toilet paper.  IF YOU HAD POLYPS REMOVED DURING THE COLONOSCOPY: No aspirin products for 7 days or as instructed.  No alcohol for 7 days or as instructed.  Eat a soft diet for the next 24 hours.  FINDING OUT THE RESULTS OF YOUR TEST Not all test results are available during your visit. If your test results are not back during the visit, make an appointment with your caregiver to find out the results. Do not assume everything is normal if you have not heard from your  caregiver or the medical facility. It is important for you to follow up on all of your test results.  SEEK IMMEDIATE MEDICAL ATTENTION IF: You have more than a spotting of blood in your stool.  Your belly is swollen (abdominal distention).  You are nauseated or vomiting.  You have a temperature over 101.  You have abdominal pain or discomfort that is severe or gets worse throughout the day.   Your colonoscopy revealed 1 polyp(s) which I removed successfully. Await pathology results, my office will contact you. Given your age I do not think we need to perform further colonoscopies for polyp surveillance.  You also have diverticulosis and internal hemorrhoids. I would recommend increasing fiber in your diet or adding OTC Benefiber/Metamucil. Be sure to drink at least 4 to 6 glasses of water  daily. Follow-up with GI as needed.   I hope you have a great rest of your week!  Rolando Cliche. Mordechai April, D.O. Gastroenterology and Hepatology [facility]

## 2024-03-21 NOTE — H&P (Signed)
 Primary Care Physician:  Harriet Limber, FNP Primary Gastroenterologist:  Dr. Mordechai April  Pre-Procedure History & Physical: HPI:  Allison Gould is a 73 y.o. female is here for a colonoscopy for colon cancer screening purposes.  Past Medical History:  Diagnosis Date   Anxiety    CKD (chronic kidney disease)    COPD (chronic obstructive pulmonary disease) (HCC)    Diabetes mellitus type II    GERD (gastroesophageal reflux disease)    Gout    Hearing aid worn    right   Hyperparathyroidism (HCC)    Hypertension    Lipid disorder    Loss of hair    for unknown reason, to see MD    Low back pain    Lupus (systemic lupus erythematosus) (HCC)    Morbid obesity with BMI of 45.0-49.9, adult [facility])    Osteoarthritis     Past Surgical History:  Procedure Laterality Date   CATARACT EXTRACTION W/PHACO Left 05/12/2016   Procedure: CATARACT EXTRACTION PHACO AND INTRAOCULAR LENS PLACEMENT (IOC);  Surgeon: Anner Kill, MD;  Location: [facility];  Service: Ophthalmology;  Laterality: Left;  CDE: 6.63   CATARACT EXTRACTION W/PHACO Right 06/16/2016   Procedure: CATARACT EXTRACTION PHACO AND INTRAOCULAR LENS PLACEMENT (IOC); CDE:  4.13;  Surgeon: Anner Kill, MD;  Location: [facility];  Service: Ophthalmology;  Laterality: Right;   COLONOSCOPY  07/12/2012   Procedure: COLONOSCOPY;  Surgeon: Alyce Jubilee, MD;  Location: [facility];  Service: Endoscopy;  Laterality: N/A;  1:00   lipoma-right shoulder     spur and nerve repair right shoulder      Prior to Admission medications   Medication Sig Start Date End Date Taking? Authorizing Provider  acetaminophen  (TYLENOL ) 500 MG tablet Take 1,000 mg by mouth every 6 (six) hours as needed for mild pain.   Yes [provider]  albuterol (VENTOLIN HFA) 108 (90 Base) MCG/ACT inhaler Inhale 2 puffs into the lungs every 6 (six) hours as needed. 04/01/21  Yes [provider]  amLODipine (NORVASC) 10 MG tablet Take 10 mg by mouth daily. 06/05/12  Yes [provider]  betamethasone dipropionate (DIPROLENE) 0.05 % cream Apply topically daily.   Yes [provider]  calcitRIOL (ROCALTROL) 0.25 MCG capsule Take 0.25 mcg by mouth daily.   Yes [provider]  Carboxymethylcellul-Glycerin 0.5-0.9 % SOLN Place 1 drop into both eyes daily as needed. Dry Eyes   Yes [provider]  cholecalciferol (VITAMIN D) 1000 UNITS tablet Take 1,000 Units by mouth daily.   Yes [provider]  docusate sodium  (COLACE) 100 MG capsule Take 1 capsule (100 mg total) by mouth daily as needed for mild constipation or moderate constipation. 07/12/22  Yes Cheyenne Cotta, MD  Fluticasone-Umeclidin-Vilant (TRELEGY ELLIPTA IN) Inhale into the lungs.   Yes [provider]  furosemide  (LASIX ) 40 MG tablet Take 1 tablet (40 mg total) by mouth daily. 04/15/21  Yes Elmyra Haggard, MD  losartan (COZAAR) 25 MG tablet Take 25 mg by mouth daily. 05/29/23  Yes [provider]  omeprazole (PRILOSEC) 20 MG capsule Take 20 mg by mouth daily. 06/05/12  Yes [provider]  pravastatin (PRAVACHOL) 10 MG tablet Take 10 mg by mouth at bedtime. 04/01/21  Yes [provider]  pregabalin (LYRICA) 50 MG capsule TAKE ONE TABLET BY MOUTH EVERYDAY AT BEDTIME FOR nerve pain 09/05/20  Yes [provider]  sertraline (ZOLOFT) 25 MG tablet Take 25 mg by mouth every morning. 05/21/23  Yes  [provider]  traMADol (ULTRAM) 50 MG tablet Take by mouth every 6 (six) hours as needed.   Yes [provider]  ibuprofen  (ADVIL ) 800 MG tablet Take 1 tablet (800 mg total) by mouth every 8 (eight) hours as needed. 11/13/22   Darrin Emerald, MD  lidocaine  (LIDODERM ) 5 % Place 1 patch onto the skin daily. Remove & Discard patch within 12 hours or as directed by MD 12/19/22   Robinson, John K, PA-C  nitroGLYCERIN (NITROSTAT) 0.4 MG SL tablet Place 0.4 mg under the tongue every 5 (five) minutes as needed. Chest Pain    [provider]  polyethylene glycol powder (GLYCOLAX /MIRALAX ) 17 GM/SCOOP powder Take 17 g by mouth daily. Mix in 8 oz of non-carbonated beverage. 02/08/24   Evander Hills, PA-C    Allergies as of 02/17/2024   (No Known Allergies)    Family History  Problem Relation Age of Onset   Lupus Sister    Colon cancer Neg Hx    Liver disease Neg Hx    Inflammatory bowel disease Neg Hx     Social History   Socioeconomic History   Marital status: Single    Spouse name: Not on file   Number of children: 1   Years of education: Not on file   Highest education level: Not on file  Occupational History   Occupation: unemployed  Tobacco Use   Smoking status: Former    Current packs/day: 0.50    Types: Cigarettes   Smokeless tobacco: Never   Tobacco comments:    quit years ago  Substance and Sexual Activity   Alcohol use: No   Drug use: No   Sexual activity: Not on file  Other Topics Concern   Not on file  Social History Narrative   Not on file   Social Drivers of Health   Financial Resource Strain: Not on file  Food Insecurity: Not on file  Transportation Needs: Not on file  Physical Activity: Not on file  Stress: Not on file  Social Connections: Not on file  Intimate Partner Violence: Not on file    Review of Systems: See HPI, otherwise negative ROS  Physical Exam: Vital signs in last 24 hours: Temp:  [98.5 F (36.9 C)] 98.5 F (36.9 C) (05/19 1020) Resp:  [17] 17 (05/19 1020) BP: (140)/(40) 140/40 (05/19 1020) SpO2:  [100 %] 100 % (05/19 1020)   General:   Alert,  Well-developed, well-nourished, pleasant and cooperative in NAD Head:  Normocephalic and atraumatic. Eyes:  Sclera clear, no icterus.   Conjunctiva pink. Ears:  Normal auditory acuity. Nose:  No deformity, discharge,  or lesions. Msk:  Symmetrical without gross deformities. Normal posture. Extremities:  Without clubbing or edema. Neurologic:  Alert and  oriented x4;  grossly normal  neurologically. Skin:  Intact without significant lesions or rashes. Psych:  Alert and cooperative. Normal mood and affect.  Impression/Plan: Allison Gould is here for a colonoscopy to be performed for colon cancer screening purposes.  The risks of the procedure including infection, bleed, or perforation as well as benefits, limitations, alternatives and imponderables have been reviewed with the patient. Questions have been answered. All parties agreeable.

## 2024-03-21 NOTE — Anesthesia Preprocedure Evaluation (Addendum)
 Anesthesia Evaluation  Patient identified by MRN, date of birth, ID band Patient awake    Reviewed: Allergy & Precautions, H&P , NPO status , Patient's Chart, lab work & pertinent test results  Airway Mallampati: II  TM Distance: >3 FB Neck ROM: Full    Dental  (+) Edentulous Upper, Edentulous Lower   Pulmonary COPD, former smoker   Pulmonary exam normal breath sounds clear to auscultation       Cardiovascular Exercise Tolerance: Poor hypertension, +CHF and + DOE  Normal cardiovascular exam Rhythm:Regular Rate:Normal  2022 ef 60-65%   Neuro/Psych   Anxiety     negative neurological ROS     GI/Hepatic Neg liver ROS,GERD  ,,  Endo/Other  diabetes  Class 3 obesityOff meds per MD secondary to renal issues Diet controlled currently  Renal/GU Renal InsufficiencyRenal disease  negative genitourinary   Musculoskeletal  (+) Arthritis ,    Abdominal  (+) + obese  Peds negative pediatric ROS (+)  Hematology  (+) Blood dyscrasia, anemia   Anesthesia Other Findings SLE  Reproductive/Obstetrics negative OB ROS                             Anesthesia Physical Anesthesia Plan  ASA: 3  Anesthesia Plan: General   Post-op Pain Management:    Induction: Intravenous  PONV Risk Score and Plan:   Airway Management Planned: Nasal Cannula  Additional Equipment:   Intra-op Plan:   Post-operative Plan:   Informed Consent: I have reviewed the patients History and Physical, chart, labs and discussed the procedure including the risks, benefits and alternatives for the proposed anesthesia with the patient or authorized representative who has indicated his/her understanding and acceptance.     Dental advisory given  Plan Discussed with: CRNA  Anesthesia Plan Comments:        Anesthesia Quick Evaluation

## 2024-03-21 NOTE — Anesthesia Postprocedure Evaluation (Signed)
 Anesthesia Post Note  Patient: Allison Gould  Procedure(s) Performed: COLONOSCOPY  Patient location during evaluation: Short Stay Anesthesia Type: General Level of consciousness: awake and alert Pain management: pain level controlled Vital Signs Assessment: post-procedure vital signs reviewed and stable Respiratory status: spontaneous breathing Cardiovascular status: stable Postop Assessment: no headache Anesthetic complications: no   No notable events documented.   Last Vitals:  Vitals:   03/21/24 1136 03/21/24 1146  BP: (!) 108/42 97/63  Pulse: 78 71  Resp: 12   Temp: 36.7 C   SpO2: 100% 100%    Last Pain:  Vitals:   03/21/24 1136  TempSrc: Oral  PainSc: 0-No pain                 Kaemon Barnett

## 2024-03-21 NOTE — Op Note (Signed)
 [facility] Patient Name: Allison Gould Procedure Date: 03/21/2024 10:50 AM MRN: [id number] Date of Birth: [birth year] Attending MD: Rolando Cliche. Mordechai April , [place], [id number] CSN: [id number] Age: 73 Admit Type: Outpatient Procedure:                Colonoscopy Indications:              Screening for colorectal malignant neoplasm Providers:                Rolando Cliche. Mordechai April, DO, Troy Furnish. Hazeline Lister RN, RN,                            [place] Wilson, Technician, Theola Fitch Referring MD:              Medicines:                See the Anesthesia note for documentation of the                            administered medications Complications:            No immediate complications. Estimated Blood Loss:     Estimated blood loss was minimal. Procedure:                Pre-Anesthesia Assessment:                           - The anesthesia plan was to use monitored                            anesthesia care (MAC).                           After obtaining informed consent, the colonoscope                            was passed under direct vision. Throughout the                            procedure, the patient's blood pressure, pulse, and                            oxygen saturations were monitored continuously. The                            [id number]-[id number] ([id number]) was introduced through the                            anus and advanced to the the cecum, identified by                            appendiceal orifice and ileocecal valve. The                            colonoscopy was performed without difficulty. The                            patient tolerated  the procedure well. The quality                            of the bowel preparation was evaluated using the                            BBPS [facility] Bowel Preparation Scale) with scores                            of: Right Colon = 3, Transverse Colon = 3 and Left                            Colon = 3 (entire mucosa seen well with no residual                             staining, small fragments of stool or opaque                            liquid). The total BBPS score equals 9. Scope In: 11:14:16 AM Scope Out: 11:27:58 AM Scope Withdrawal Time: 0 hours 9 minutes 46 seconds  Total Procedure Duration: 0 hours 13 minutes 42 seconds  Findings:      Non-bleeding internal hemorrhoids were found during retroflexion. The       hemorrhoids were large.      Multiple small-mouthed diverticula were found in the sigmoid colon.      A 4 mm polyp was found in the transverse colon. The polyp was sessile.       The polyp was removed with a cold snare. Resection and retrieval were       complete.      The exam was otherwise without abnormality. Impression:               - Non-bleeding internal hemorrhoids.                           - Diverticulosis in the sigmoid colon.                           - One 4 mm polyp in the transverse colon, removed                            with a cold snare. Resected and retrieved.                           - The examination was otherwise normal. Moderate Sedation:      Per Anesthesia Care Recommendation:           - Patient has a contact number available for                            emergencies. The signs and symptoms of potential                            delayed complications were discussed with the  patient. Return to normal activities tomorrow.                            Written discharge instructions were provided to the                            patient.                           - Resume previous diet.                           - Continue present medications.                           - Await pathology results.                           - No repeat colonoscopy due to age.                           - Return to GI clinic PRN. Procedure Code(s):        --- Professional ---                           [phone], Colonoscopy, flexible; with removal of                            tumor(s),  polyp(s), or other lesion(s) by snare                            technique Diagnosis Code(s):        --- Professional ---                           Z12.11, Encounter for screening for malignant                            neoplasm of colon                           K64.8, Other hemorrhoids                           D12.3, Benign neoplasm of transverse colon (hepatic                            flexure or splenic flexure)                           K57.30, Diverticulosis of large intestine without                            perforation or abscess without bleeding CPT copyright 2022 American [employer] Association. All rights reserved. The codes documented in this report are preliminary and upon coder review may  be revised to meet current compliance requirements. Rolando Cliche. Mordechai April, DO Rolando Cliche. Youcef Klas, DO 03/21/2024  11:31:18 AM This report has been signed electronically. Number of Addenda: 0

## 2024-03-21 NOTE — Transfer of Care (Signed)
 Immediate Anesthesia Transfer of Care Note  Patient: Allison Gould  Procedure(s) Performed: COLONOSCOPY  Patient Location: Short Stay  Anesthesia Type:General  Level of Consciousness: awake  Airway & Oxygen Therapy: Patient Spontanous Breathing  Post-op Assessment: Report given to RN  Post vital signs: Reviewed and stable  Last Vitals:  Vitals Value Taken Time  BP    Temp    Pulse    Resp    SpO2      Last Pain:  Vitals:   03/21/24 1107  PainSc: 0-No pain      Patients Stated Pain Goal: 9 (03/21/24 1018)  Complications: No notable events documented.

## 2024-03-22 ENCOUNTER — Encounter (HOSPITAL_COMMUNITY): Payer: Self-pay | Admitting: Internal Medicine

## 2024-03-22 LAB — SURGICAL PATHOLOGY

## 2024-03-24 ENCOUNTER — Ambulatory Visit: Payer: Self-pay | Admitting: Internal Medicine

## 2024-04-06 DIAGNOSIS — N189 Chronic kidney disease, unspecified: Secondary | ICD-10-CM | POA: Diagnosis not present

## 2024-04-06 DIAGNOSIS — R809 Proteinuria, unspecified: Secondary | ICD-10-CM | POA: Diagnosis not present

## 2024-04-06 DIAGNOSIS — E211 Secondary hyperparathyroidism, not elsewhere classified: Secondary | ICD-10-CM | POA: Diagnosis not present

## 2024-04-06 DIAGNOSIS — D631 Anemia in chronic kidney disease: Secondary | ICD-10-CM | POA: Diagnosis not present

## 2024-04-13 DIAGNOSIS — D638 Anemia in other chronic diseases classified elsewhere: Secondary | ICD-10-CM | POA: Diagnosis not present

## 2024-04-13 DIAGNOSIS — E1122 Type 2 diabetes mellitus with diabetic chronic kidney disease: Secondary | ICD-10-CM | POA: Diagnosis not present

## 2024-04-13 DIAGNOSIS — N2581 Secondary hyperparathyroidism of renal origin: Secondary | ICD-10-CM | POA: Diagnosis not present

## 2024-04-13 DIAGNOSIS — I129 Hypertensive chronic kidney disease with stage 1 through stage 4 chronic kidney disease, or unspecified chronic kidney disease: Secondary | ICD-10-CM | POA: Diagnosis not present

## 2024-04-26 DIAGNOSIS — H905 Unspecified sensorineural hearing loss: Secondary | ICD-10-CM | POA: Diagnosis not present

## 2024-04-28 DIAGNOSIS — N189 Chronic kidney disease, unspecified: Secondary | ICD-10-CM | POA: Diagnosis not present

## 2024-05-11 DIAGNOSIS — N2581 Secondary hyperparathyroidism of renal origin: Secondary | ICD-10-CM | POA: Diagnosis not present

## 2024-05-11 DIAGNOSIS — E1122 Type 2 diabetes mellitus with diabetic chronic kidney disease: Secondary | ICD-10-CM | POA: Diagnosis not present

## 2024-05-11 DIAGNOSIS — I129 Hypertensive chronic kidney disease with stage 1 through stage 4 chronic kidney disease, or unspecified chronic kidney disease: Secondary | ICD-10-CM | POA: Diagnosis not present

## 2024-05-11 DIAGNOSIS — R600 Localized edema: Secondary | ICD-10-CM | POA: Diagnosis not present

## 2024-06-01 ENCOUNTER — Telehealth: Payer: Self-pay

## 2024-06-01 NOTE — Telephone Encounter (Signed)
 Up to date on meds, next review in September

## 2024-06-03 DIAGNOSIS — N189 Chronic kidney disease, unspecified: Secondary | ICD-10-CM | POA: Diagnosis not present

## 2024-06-06 DIAGNOSIS — L219 Seborrheic dermatitis, unspecified: Secondary | ICD-10-CM | POA: Diagnosis not present

## 2024-06-06 DIAGNOSIS — L6689 Other cicatricial alopecia: Secondary | ICD-10-CM | POA: Diagnosis not present

## 2024-06-06 DIAGNOSIS — L089 Local infection of the skin and subcutaneous tissue, unspecified: Secondary | ICD-10-CM | POA: Diagnosis not present

## 2024-06-06 DIAGNOSIS — L930 Discoid lupus erythematosus: Secondary | ICD-10-CM | POA: Diagnosis not present

## 2024-06-07 NOTE — Procedures (Signed)
 SABRA

## 2024-06-09 ENCOUNTER — Other Ambulatory Visit (HOSPITAL_COMMUNITY): Payer: Self-pay | Admitting: Nurse Practitioner

## 2024-06-09 ENCOUNTER — Ambulatory Visit (HOSPITAL_COMMUNITY)
Admission: RE | Admit: 2024-06-09 | Discharge: 2024-06-09 | Disposition: A | Discharge status: Home / Self Care | Source: Ambulatory Visit | Attending: Nurse Practitioner | Admitting: Nurse Practitioner

## 2024-06-09 DIAGNOSIS — E875 Hyperkalemia: Secondary | ICD-10-CM | POA: Diagnosis not present

## 2024-06-09 DIAGNOSIS — M4316 Spondylolisthesis, lumbar region: Secondary | ICD-10-CM | POA: Diagnosis not present

## 2024-06-09 DIAGNOSIS — L930 Discoid lupus erythematosus: Secondary | ICD-10-CM | POA: Diagnosis not present

## 2024-06-09 DIAGNOSIS — E785 Hyperlipidemia, unspecified: Secondary | ICD-10-CM | POA: Diagnosis not present

## 2024-06-09 DIAGNOSIS — D696 Thrombocytopenia, unspecified: Secondary | ICD-10-CM | POA: Diagnosis not present

## 2024-06-09 DIAGNOSIS — N189 Chronic kidney disease, unspecified: Secondary | ICD-10-CM | POA: Diagnosis not present

## 2024-06-09 DIAGNOSIS — M545 Low back pain, unspecified: Secondary | ICD-10-CM | POA: Diagnosis not present

## 2024-06-09 DIAGNOSIS — M5136 Other intervertebral disc degeneration, lumbar region with discogenic back pain only: Secondary | ICD-10-CM | POA: Diagnosis not present

## 2024-06-09 DIAGNOSIS — R350 Frequency of micturition: Secondary | ICD-10-CM | POA: Diagnosis not present

## 2024-06-09 DIAGNOSIS — D631 Anemia in chronic kidney disease: Secondary | ICD-10-CM | POA: Diagnosis not present

## 2024-06-09 DIAGNOSIS — E870 Hyperosmolality and hypernatremia: Secondary | ICD-10-CM | POA: Diagnosis not present

## 2024-06-09 DIAGNOSIS — I10 Essential (primary) hypertension: Secondary | ICD-10-CM | POA: Diagnosis not present

## 2024-06-09 DIAGNOSIS — I129 Hypertensive chronic kidney disease with stage 1 through stage 4 chronic kidney disease, or unspecified chronic kidney disease: Secondary | ICD-10-CM | POA: Diagnosis not present

## 2024-06-09 DIAGNOSIS — R7303 Prediabetes: Secondary | ICD-10-CM | POA: Diagnosis not present

## 2024-08-11 DIAGNOSIS — M5440 Lumbago with sciatica, unspecified side: Secondary | ICD-10-CM | POA: Diagnosis not present

## 2024-08-11 DIAGNOSIS — R350 Frequency of micturition: Secondary | ICD-10-CM | POA: Diagnosis not present

## 2024-08-11 DIAGNOSIS — M545 Low back pain, unspecified: Secondary | ICD-10-CM | POA: Diagnosis not present

## 2024-10-18 NOTE — Therapy (Signed)
 OUTPATIENT PHYSICAL THERAPY THORACOLUMBAR EVALUATION   Patient Name: Allison Gould MRN: [id number] DOB:[birth year], 73 y.o., female Today's Date: 10/20/2024  END OF SESSION:  PT End of Session - 10/20/24 1503     Visit Number 1    Number of Visits 12    Date for Recertification  12/01/24    Authorization Type [employer] dual complete    Authorization Time Period no auth needed    PT Start Time 1505    PT Stop Time 1541    PT Time Calculation (min) 36 min    Activity Tolerance Patient tolerated treatment well    Behavior During Therapy WFL for tasks assessed/performed          Past Medical History:  Diagnosis Date   Anxiety    CKD (chronic kidney disease)    COPD (chronic obstructive pulmonary disease) (HCC)    Diabetes mellitus type II    GERD (gastroesophageal reflux disease)    Gout    Hearing aid worn    right   Hyperparathyroidism    Hypertension    Lipid disorder    Loss of hair    for unknown reason, to see MD    Low back pain    Lupus (systemic lupus erythematosus) (HCC)    Morbid obesity with BMI of 45.0-49.9, adult [facility])    Osteoarthritis    Past Surgical History:  Procedure Laterality Date   CATARACT EXTRACTION W/PHACO Left 05/12/2016   Procedure: CATARACT EXTRACTION PHACO AND INTRAOCULAR LENS PLACEMENT (IOC);  Surgeon: Cherene Mania, MD;  Location: [facility];  Service: Ophthalmology;  Laterality: Left;  CDE: 6.63   CATARACT EXTRACTION W/PHACO Right 06/16/2016   Procedure: CATARACT EXTRACTION PHACO AND INTRAOCULAR LENS PLACEMENT (IOC); CDE:  4.13;  Surgeon: Cherene Mania, MD;  Location: [facility];  Service: Ophthalmology;  Laterality: Right;   COLONOSCOPY  07/12/2012   Procedure: COLONOSCOPY;  Surgeon: Margo LITTIE Haddock, MD;  Location: [facility];  Service: Endoscopy;  Laterality: N/A;  1:00   COLONOSCOPY N/A 03/21/2024   Procedure: COLONOSCOPY;  Surgeon: Cindie Carlin POUR, DO;  Location: [facility];  Service: Endoscopy;  Laterality: N/A;  1030AM, ASA 3   lipoma-right  shoulder     spur and nerve repair right shoulder     Patient Active Problem List   Diagnosis Date Noted   Snoring 03/11/2024   Hyperkalemia 09/17/2022   Hypernatremia 09/17/2022   Localized edema 07/23/2022   Paresthesia of hand 07/23/2022   Congestive heart failure (HCC) 07/12/2022   Chronic obstructive lung disease (HCC) 01/27/2022   Morbid obesity (HCC) 01/27/2022   Edema of lower extremity 09/30/2021   Lupus erythematosus 09/25/2021   Chronic insomnia 08/21/2021   Chronic kidney disease 06/27/2021   Thrombocytopenic disorder 06/27/2021   Gastroesophageal reflux disease 05/24/2021   Hyperlipidemia 05/24/2021   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   Leg weakness 04/14/2013   Back pain 04/14/2013   Knee pain 04/14/2013   Pain in joint, shoulder region 03/11/2013   Muscle weakness (generalized) 03/11/2013   Pain in joint, ankle and foot 03/11/2013   Abnormality of gait 03/11/2013   Right ankle sprain 03/02/2013   Shoulder contusion 03/02/2013   Constipation 06/22/2012   Colon cancer screening 06/22/2012   Pain in limb 07/05/2009   FLANK PAIN, RIGHT 04/04/2009   ELECTROCARDIOGRAM, ABNORMAL 02/20/2009   DIABETES MELLITUS, WITH NEUROLOGICAL COMPLICATIONS 01/09/2009   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) 01/09/2009   Essential  hypertension 10/13/2008   Osteoarthritis 10/13/2008   LOW BACK PAIN 10/13/2008    PCP: Joeann Browning, FNP  REFERRING PROVIDER: Joeann Browning, FNP  REFERRING DIAG: M54.50 (ICD-10-CM) - Low back pain, unspecified  Rationale for Evaluation and Treatment: Rehabilitation  THERAPY DIAG:  Low back pain, unspecified back pain laterality, unspecified chronicity, unspecified whether sciatica present - Plan: PT plan of care cert/re-cert  Other symptoms and signs involving the musculoskeletal system - Plan: PT plan of care cert/re-cert  Difficulty in  walking, not elsewhere classified - Plan: PT plan of care cert/re-cert  ONSET DATE: worse starting in August of this year  SUBJECTIVE:                                                                                                                                                                                           SUBJECTIVE STATEMENT: Has low back pain that got worse back in August; went to ED; after kept hurting went back to the MD and told her about it; she referred to physical therapy; [facility]  PERTINENT HISTORY:  Fall with left leg fracture several years ago Anxiety COPD DM HTN CKD Neuropathy? States her feet are numb but unable to say if neuropathy Gout Left knee gives way  PAIN:  Are you having pain? Yes: NPRS scale: 8/10; 10/10 at worst; 4/10 at best Pain location: low back right side Pain description: aching Aggravating factors: laying in certain positions; hard to bend over Relieving factors: muscle cream Aspercreme; Tylenol   PRECAUTIONS: Fall  RED FLAGS: None   WEIGHT BEARING RESTRICTIONS: No  FALLS:  Has patient fallen in last 6 months? No   OCCUPATION: retired  PLOF: Independent with household mobility with device and Independent with community mobility with device  PATIENT GOALS: get better so I can walk better  NEXT MD VISIT: 3rd month  OBJECTIVE:  Note: Objective measures were completed at Evaluation unless otherwise noted.  DIAGNOSTIC FINDINGS:  CLINICAL DATA:  Low back pain   EXAM: LUMBAR SPINE - 2-3 VIEW   COMPARISON:  11/13/2022   FINDINGS: Diffuse degenerative facet disease, most pronounced in the lower lumbar spine. 6 mm anterolisthesis of L4 on L5, stable. Disc spaces are maintained. No fracture. SI joints symmetric. Calcified fibroid centrally in the pelvis.   IMPRESSION: Degenerative facet disease with stable slight anterolisthesis at L4-5.   No acute bony abnormality.     Electronically Signed   By:  Franky Crease M.D.   On: 06/22/2024 00:19  PATIENT SURVEYS:  Modified Oswestry:  MODIFIED OSWESTRY DISABILITY SCALE  Date: 10/20/2024 Score  Total 29/50; 58%   Interpretation of scores: Score Category Description  0-20% Minimal Disability The patient can cope with most living activities. Usually no treatment is indicated apart from advice on lifting, sitting and exercise  21-40% Moderate Disability The patient experiences more pain and difficulty with sitting, lifting and standing. Travel and social life are more difficult and they may be disabled from work. Personal care, sexual activity and sleeping are not grossly affected, and the patient can usually be managed by conservative means  41-60% Severe Disability Pain remains the main problem in this group, but activities of daily living are affected. These patients require a detailed investigation  61-80% Crippled Back pain impinges on all aspects of the patients life. Positive intervention is required  81-100% Bed-bound These patients are either bed-bound or exaggerating their symptoms  Bluford FORBES Zoe DELENA Karon DELENA, et al. Surgery versus conservative management of stable thoracolumbar fracture: the PRESTO feasibility RCT. [place] ([place]): [employer]; 2021 Nov. [facility] Technology Assessment, No. 25.62.) Appendix 3, Oswestry Disability Index category descriptors. Available from: Findjewelers.cz  Minimally Clinically Important Difference (MCID) = 12.8%  COGNITION: Overall cognitive status: Within functional limits for tasks assessed     SENSATION: WFL;  legs are swollen lower portion  MUSCLE LENGTH: Hamstrings: next visit  POSTURE: increased lumbar lordosis and flexed trunk   PALPATION: Tender right side low back  LUMBAR ROM:   AROM eval  Flexion Slow; guarded down to fingertips 5 above ankle  Extension To neutral only  Right lateral flexion    Left lateral flexion   Right rotation   Left rotation    (Blank rows = not tested)  LOWER EXTREMITY ROM:     Active  Right eval Left eval  Hip flexion    Hip extension    Hip abduction    Hip adduction    Hip internal rotation    Hip external rotation    Knee flexion    Knee extension    Ankle dorsiflexion    Ankle plantarflexion    Ankle inversion    Ankle eversion     (Blank rows = not tested)  LOWER EXTREMITY MMT:    MMT Right eval Left eval  Hip flexion 4 4  Hip extension    Hip abduction    Hip adduction    Hip internal rotation    Hip external rotation    Knee flexion    Knee extension 4+ 4+  Ankle dorsiflexion 4+ 4+ (sore big toe from dropping phone on her toe)  Ankle plantarflexion    Ankle inversion    Ankle eversion     (Blank rows = not tested)    FUNCTIONAL TESTS:  5 times sit to stand: 31.39 sec using hands to push up to standing 2 minute walk test: 94 ft  GAIT: Distance walked: 50 ft in clinic Assistive device utilized: Single point cane Level of assistance: Modified independence Comments: slow antalgic gait  TREATMENT DATE: 10/20/2024 physical therapy evaluation and HEP instruction  PATIENT EDUCATION:  Education details: Patient educated on exam findings, POC, scope of PT, HEP, and what to expect next visit. Person educated: Patient Education method: Explanation, Demonstration, and Handouts Education comprehension: verbalized understanding, returned demonstration, verbal cues required, and tactile cues required   HOME EXERCISE PROGRAM: Access Code: [email or web address] URL: [email or web address] Date: 10/20/2024 Prepared by: [facility]  Exercises - Sit to Stand with Armchair  - 2 x daily - 7 x weekly - 1 sets - 5 reps - Seated Transversus Abdominis Bracing  - 2 x daily - 7 x weekly - 1 sets - 10 reps  - 5 sec hold  ASSESSMENT:  CLINICAL IMPRESSION: Patient is a 73 y.o. female who was seen today for physical therapy evaluation and treatment for M54.50 (ICD-10-CM) - Low back pain, unspecified.  Patient demonstrates muscle weakness, reduced ROM, and fascial restrictions which are likely contributing to symptoms of pain and are negatively impacting patient ability to perform ADLs and functional mobility tasks. Patient will benefit from skilled physical therapy services to address these deficits to reduce pain and improve level of function with ADLs and functional mobility tasks.   OBJECTIVE IMPAIRMENTS: Abnormal gait, decreased activity tolerance, decreased ROM, decreased strength, and pain.   ACTIVITY LIMITATIONS: carrying, lifting, bending, sitting, standing, squatting, sleeping, stairs, bathing, and locomotion level  PARTICIPATION LIMITATIONS: meal prep, cleaning, laundry, driving, shopping, community activity, and church  PERSONAL FACTORS: 3+ comorbidities: DM, HTN, CKD are also affecting patient's functional outcome.   REHAB POTENTIAL: Good  CLINICAL DECISION MAKING: Evolving/moderate complexity  EVALUATION COMPLEXITY: Moderate   GOALS: Goals reviewed with patient? No  SHORT TERM GOALS: Target date: 11/11/2023  patient will be independent with initial HEP and compliant with HEP 3-4 times a week   Baseline: Goal status: INITIAL  2.  Patient will report 50% improvement overall   Baseline:  Goal status: INITIAL    LONG TERM GOALS: Target date: 12/01/2024  Patient will be independent in self management strategies to improve quality of life and functional outcomes.  Baseline:  Goal status: INITIAL  2.  Patient will report 70% improvement overall   Baseline:  Goal status: INITIAL  3.  Patient will improve 5 times sit to stand score to 20 sec or less to demonstrate improved functional mobility and increased leg strength.    Baseline: 31.39 sec Goal status:  INITIAL  4.  Patient will increase distance on 2 MWT to 150 ft or more with LRAD to demonstrate improved speed and efficiency with household and community ambulation  Baseline: 94 ft with SPC Goal status: INITIAL  PLAN:  PT FREQUENCY: 2x/week  PT DURATION: 6 weeks  PLANNED INTERVENTIONS: [id number]- PT Re-evaluation, 97110-Therapeutic exercises, 97530- Therapeutic activity, 97112- Neuromuscular re-education, 97535- Self Care, [id number]- Manual therapy, [id number]- Gait training, [phone]- Orthotic Fit/training, [phone]- Canalith repositioning, [id number]- Aquatic Therapy, 97760- Splinting, 97597- Wound care (first 20 sq cm), 97598- Wound care (each additional 20 sq cm)Patient/Family education, Balance training, Stair training, Taping, Dry Needling, Joint mobilization, Joint manipulation, Spinal manipulation, Spinal mobilization, Scar mobilization, and DME instructions. SABRA  PLAN FOR NEXT SESSION: Review HEP and goals; core and lower extremity strengthening; test SLS; postural strengthening   3:49 PM, 10/20/2024 Kellis Mcadam Small Brylan Dec MPT [facility] [place] [phone] [phone]

## 2024-10-20 ENCOUNTER — Other Ambulatory Visit: Payer: Self-pay

## 2024-10-20 ENCOUNTER — Ambulatory Visit (HOSPITAL_COMMUNITY)

## 2024-10-20 DIAGNOSIS — R29898 Other symptoms and signs involving the musculoskeletal system: Secondary | ICD-10-CM | POA: Insufficient documentation

## 2024-10-20 DIAGNOSIS — M545 Low back pain, unspecified: Secondary | ICD-10-CM | POA: Diagnosis present

## 2024-10-20 DIAGNOSIS — R262 Difficulty in walking, not elsewhere classified: Secondary | ICD-10-CM | POA: Diagnosis present

## 2024-11-01 ENCOUNTER — Encounter (HOSPITAL_COMMUNITY): Payer: Self-pay

## 2024-11-01 ENCOUNTER — Ambulatory Visit (HOSPITAL_COMMUNITY)

## 2024-11-01 DIAGNOSIS — M545 Low back pain, unspecified: Secondary | ICD-10-CM | POA: Diagnosis not present

## 2024-11-01 DIAGNOSIS — R29898 Other symptoms and signs involving the musculoskeletal system: Secondary | ICD-10-CM

## 2024-11-01 DIAGNOSIS — R262 Difficulty in walking, not elsewhere classified: Secondary | ICD-10-CM

## 2024-11-01 NOTE — Therapy (Signed)
 " OUTPATIENT PHYSICAL THERAPY THORACOLUMBAR TREATMENT   Patient Name: Allison Gould MRN: [id number] DOB:11-08-1950, 73 y.o., female Today's Date: 11/01/2024  END OF SESSION:  PT End of Session - 11/01/24 1414     Visit Number 2    Number of Visits 12    Date for Recertification  12/01/24    Authorization Type [employer] dual complete    Authorization Time Period no auth needed    PT Start Time 1417    PT Stop Time 1503    PT Time Calculation (min) 46 min    Activity Tolerance Patient tolerated treatment well    Behavior During Therapy WFL for tasks assessed/performed          Past Medical History:  Diagnosis Date   Anxiety    CKD (chronic kidney disease)    COPD (chronic obstructive pulmonary disease) (HCC)    Diabetes mellitus type II    GERD (gastroesophageal reflux disease)    Gout    Hearing aid worn    right   Hyperparathyroidism    Hypertension    Lipid disorder    Loss of hair    for unknown reason, to see MD    Low back pain    Lupus (systemic lupus erythematosus) (HCC)    Morbid obesity with BMI of 45.0-49.9, adult [facility])    Osteoarthritis    Past Surgical History:  Procedure Laterality Date   CATARACT EXTRACTION W/PHACO Left 05/12/2016   Procedure: CATARACT EXTRACTION PHACO AND INTRAOCULAR LENS PLACEMENT (IOC);  Surgeon: Cherene Mania, MD;  Location: [facility];  Service: Ophthalmology;  Laterality: Left;  CDE: 6.63   CATARACT EXTRACTION W/PHACO Right 06/16/2016   Procedure: CATARACT EXTRACTION PHACO AND INTRAOCULAR LENS PLACEMENT (IOC); CDE:  4.13;  Surgeon: Cherene Mania, MD;  Location: [facility];  Service: Ophthalmology;  Laterality: Right;   COLONOSCOPY  07/12/2012   Procedure: COLONOSCOPY;  Surgeon: Margo LITTIE Haddock, MD;  Location: [facility];  Service: Endoscopy;  Laterality: N/A;  1:00   COLONOSCOPY N/A 03/21/2024   Procedure: COLONOSCOPY;  Surgeon: Cindie Carlin POUR, DO;  Location: [facility];  Service: Endoscopy;  Laterality: N/A;  1030AM, ASA 3   lipoma-right  shoulder     spur and nerve repair right shoulder     Patient Active Problem List   Diagnosis Date Noted   Snoring 03/11/2024   Hyperkalemia 09/17/2022   Hypernatremia 09/17/2022   Localized edema 07/23/2022   Paresthesia of hand 07/23/2022   Congestive heart failure (HCC) 07/12/2022   Chronic obstructive lung disease (HCC) 01/27/2022   Morbid obesity (HCC) 01/27/2022   Edema of lower extremity 09/30/2021   Lupus erythematosus 09/25/2021   Chronic insomnia 08/21/2021   Chronic kidney disease 06/27/2021   Thrombocytopenic disorder 06/27/2021   Gastroesophageal reflux disease 05/24/2021   Hyperlipidemia 05/24/2021   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   Leg weakness 04/14/2013   Back pain 04/14/2013   Knee pain 04/14/2013   Pain in joint, shoulder region 03/11/2013   Muscle weakness (generalized) 03/11/2013   Pain in joint, ankle and foot 03/11/2013   Abnormality of gait 03/11/2013   Right ankle sprain 03/02/2013   Shoulder contusion 03/02/2013   Constipation 06/22/2012   Colon cancer screening 06/22/2012   Pain in limb 07/05/2009   FLANK PAIN, RIGHT 04/04/2009   ELECTROCARDIOGRAM, ABNORMAL 02/20/2009   DIABETES MELLITUS, WITH NEUROLOGICAL COMPLICATIONS 01/09/2009   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) 01/09/2009  Essential hypertension 10/13/2008   Osteoarthritis 10/13/2008   LOW BACK PAIN 10/13/2008    PCP: Joeann Browning, FNP  REFERRING PROVIDER: Joeann Browning, FNP  REFERRING DIAG: M54.50 (ICD-10-CM) - Low back pain, unspecified  Rationale for Evaluation and Treatment: Rehabilitation  THERAPY DIAG:  Low back pain, unspecified back pain laterality, unspecified chronicity, unspecified whether sciatica present  Other symptoms and signs involving the musculoskeletal system  Difficulty in walking, not elsewhere classified  ONSET DATE: worse starting in August of  this year  SUBJECTIVE:                                                                                                                                                                                           SUBJECTIVE STATEMENT: 11/01/24:  Back us  feeling better today, pain scale 5/10.  Reports she has increased pain with Lt>Rt knee 8-10/10.  Encouraged to increase to 2x/day with fluid pills.  Has worn compression stockings before, not wearing any currently.  Eval:  Has low back pain that got worse back in August; went to ED; after kept hurting went back to the MD and told her about it; she referred to physical therapy; [facility] with [facility]  PERTINENT HISTORY:  Fall with left leg fracture several years ago Anxiety COPD DM HTN CKD Neuropathy? States her feet are numb but unable to say if neuropathy Gout Left knee gives way  PAIN:  Are you having pain? Yes: NPRS scale: 8/10; 10/10 at worst; 4/10 at best Pain location: low back right side Pain description: aching Aggravating factors: laying in certain positions; hard to bend over Relieving factors: muscle cream Aspercreme; Tylenol   PRECAUTIONS: Fall  RED FLAGS: None   WEIGHT BEARING RESTRICTIONS: No  FALLS:  Has patient fallen in last 6 months? No   OCCUPATION: retired  PLOF: Independent with household mobility with device and Independent with community mobility with device  PATIENT GOALS: get better so I can walk better  NEXT MD VISIT: 3rd month  OBJECTIVE:  Note: Objective measures were completed at Evaluation unless otherwise noted.  DIAGNOSTIC FINDINGS:  CLINICAL DATA:  Low back pain   EXAM: LUMBAR SPINE - 2-3 VIEW   COMPARISON:  11/13/2022   FINDINGS: Diffuse degenerative facet disease, most pronounced in the lower lumbar spine. 6 mm anterolisthesis of L4 on L5, stable. Disc spaces are maintained. No fracture. SI joints symmetric. Calcified fibroid centrally in the pelvis.    IMPRESSION: Degenerative facet disease with stable slight anterolisthesis at L4-5.   No acute bony abnormality.     Electronically Signed   By: Franky Crease M.D.   On: 06/22/2024 00:19  PATIENT SURVEYS:  Modified Oswestry:  MODIFIED OSWESTRY DISABILITY SCALE  Date: 10/20/2024 Score                                Total 29/50; 58%   Interpretation of scores: Score Category Description  0-20% Minimal Disability The patient can cope with most living activities. Usually no treatment is indicated apart from advice on lifting, sitting and exercise  21-40% Moderate Disability The patient experiences more pain and difficulty with sitting, lifting and standing. Travel and social life are more difficult and they may be disabled from work. Personal care, sexual activity and sleeping are not grossly affected, and the patient can usually be managed by conservative means  41-60% Severe Disability Pain remains the main problem in this group, but activities of daily living are affected. These patients require a detailed investigation  61-80% Crippled Back pain impinges on all aspects of the patients life. Positive intervention is required  81-100% Bed-bound These patients are either bed-bound or exaggerating their symptoms  Bluford FORBES Zoe DELENA Karon DELENA, et al. Surgery versus conservative management of stable thoracolumbar fracture: the PRESTO feasibility RCT. [place] ([place]): [employer]; 2021 Nov. [facility] Technology Assessment, No. 25.62.) Appendix 3, Oswestry Disability Index category descriptors. Available from: Findjewelers.cz  Minimally Clinically Important Difference (MCID) = 12.8%  COGNITION: Overall cognitive status: Within functional limits for tasks assessed     SENSATION: WFL;  legs are swollen lower portion  MUSCLE LENGTH: Hamstrings: next visit  POSTURE: increased lumbar lordosis and flexed trunk   PALPATION: Tender right  side low back  LUMBAR ROM:   AROM eval  Flexion Slow; guarded down to fingertips 5 above ankle  Extension To neutral only  Right lateral flexion   Left lateral flexion   Right rotation   Left rotation    (Blank rows = not tested)  LOWER EXTREMITY ROM:     Active  Right eval Left eval  Hip flexion    Hip extension    Hip abduction    Hip adduction    Hip internal rotation    Hip external rotation    Knee flexion    Knee extension    Ankle dorsiflexion    Ankle plantarflexion    Ankle inversion    Ankle eversion     (Blank rows = not tested)  LOWER EXTREMITY MMT:    MMT Right eval Left eval  Hip flexion 4 4  Hip extension    Hip abduction    Hip adduction    Hip internal rotation    Hip external rotation    Knee flexion    Knee extension 4+ 4+  Ankle dorsiflexion 4+ 4+ (sore big toe from dropping phone on her toe)  Ankle plantarflexion    Ankle inversion    Ankle eversion     (Blank rows = not tested)    FUNCTIONAL TESTS:  5 times sit to stand: 31.39 sec using hands to push up to standing 2 minute walk test: 94 ft  GAIT: Distance walked: 50 ft in clinic Assistive device utilized: Single point cane Level of assistance: Modified independence Comments: slow antalgic gait  TREATMENT DATE:  11/01/24: Reviewed goals Educated importance of HEP compliance for maximal benefits Discussed benefits with compression garments for edema control Given ETI handout for thigh high compression garment STS  with UE on thigh 10x, eccentric control SLS Rt 1, Lt 1-2 max of 5 attempts  Supine: TrA activation paired with cueing for breathing 10x 5 Decompression 2-5 5x 5 Bridge 10x 5 partial raise  10/20/2024 physical therapy evaluation and HEP instruction                                                                                                                                 PATIENT EDUCATION:  Education details: Patient educated on exam findings, POC,  scope of PT, HEP, and what to expect next visit. Person educated: Patient Education method: Explanation, Demonstration, and Handouts Education comprehension: verbalized understanding, returned demonstration, verbal cues required, and tactile cues required   HOME EXERCISE PROGRAM: Access Code: [email or web address] URL: [email or web address] Date: 10/20/2024 Prepared by: [facility]  Exercises - Sit to Stand with Armchair  - 2 x daily - 7 x weekly - 1 sets - 5 reps - Seated Transversus Abdominis Bracing  - 2 x daily - 7 x weekly - 1 sets - 10 reps - 5 sec hold  11/01/24: -Decompression exercises ETI handout for thigh high compression garments  ASSESSMENT:  CLINICAL IMPRESSION: 11/01/24:  Reviewed goals and educated importance of HEP compliance for maximal benefits.  PT able to recall current exercise program, encouraged to continue breathing with TrA bracing exercise exercise, able to complete STS without UE from chair.  Session focus with core and proximal strengthening as well as postural strengthening.  Added decompression for posterior chain strengthening with good tolerance, was limited by knee pain that was monitored through session.  Noted edema present Bil LE, educated benefits with compression garments for edema and pain control, measurements taken and given ETI handout.      Eval:  Patient is a 73 y.o. female who was seen today for physical therapy evaluation and treatment for M54.50 (ICD-10-CM) - Low back pain, unspecified.  Patient demonstrates muscle weakness, reduced ROM, and fascial restrictions which are likely contributing to symptoms of pain and are negatively impacting patient ability to perform ADLs and functional mobility tasks. Patient will benefit from skilled physical therapy services to address these deficits to reduce pain and improve level of function with ADLs and functional mobility tasks.   OBJECTIVE IMPAIRMENTS: Abnormal gait, decreased activity tolerance,  decreased ROM, decreased strength, and pain.   ACTIVITY LIMITATIONS: carrying, lifting, bending, sitting, standing, squatting, sleeping, stairs, bathing, and locomotion level  PARTICIPATION LIMITATIONS: meal prep, cleaning, laundry, driving, shopping, community activity, and church  PERSONAL FACTORS: 3+ comorbidities: DM, HTN, CKD are also affecting patient's functional outcome.   REHAB POTENTIAL: Good  CLINICAL DECISION MAKING: Evolving/moderate complexity  EVALUATION COMPLEXITY: Moderate   GOALS: Goals reviewed with patient? No  SHORT TERM GOALS: Target date: 11/11/2023  patient will be independent with initial HEP and compliant with HEP 3-4 times a week   Baseline: Goal status: INITIAL  2.  Patient will report 50% improvement overall   Baseline:  Goal status: INITIAL    LONG TERM GOALS: Target date: 12/01/2024  Patient will be  independent in self management strategies to improve quality of life and functional outcomes.  Baseline:  Goal status: INITIAL  2.  Patient will report 70% improvement overall   Baseline:  Goal status: INITIAL  3.  Patient will improve 5 times sit to stand score to 20 sec or less to demonstrate improved functional mobility and increased leg strength.    Baseline: 31.39 sec Goal status: INITIAL  4.  Patient will increase distance on 2 MWT to 150 ft or more with LRAD to demonstrate improved speed and efficiency with household and community ambulation  Baseline: 94 ft with SPC Goal status: INITIAL  PLAN:  PT FREQUENCY: 2x/week  PT DURATION: 6 weeks  PLANNED INTERVENTIONS: 97164- PT Re-evaluation, 97110-Therapeutic exercises, 97530- Therapeutic activity, 97112- Neuromuscular re-education, 97535- Self Care, [id number]- Manual therapy, [id number]- Gait training, [phone]- Orthotic Fit/training, [phone]- Canalith repositioning, [id number]- Aquatic Therapy, 97760- Splinting, 97597- Wound care (first 20 sq cm), 97598- Wound care (each additional 20 sq  cm)Patient/Family education, Balance training, Stair training, Taping, Dry Needling, Joint mobilization, Joint manipulation, Spinal manipulation, Spinal mobilization, Scar mobilization, and DME instructions. SABRA  PLAN FOR NEXT SESSION:  core and lower extremity strengthening; postural strengthening  Augustin Mclean, LPTA/CLT; CBIS [phone]  4:16 PM, 11/01/2024    "

## 2024-11-07 ENCOUNTER — Ambulatory Visit (HOSPITAL_COMMUNITY): Attending: Nurse Practitioner

## 2024-11-07 ENCOUNTER — Encounter (HOSPITAL_COMMUNITY): Payer: Self-pay

## 2024-11-07 DIAGNOSIS — R262 Difficulty in walking, not elsewhere classified: Secondary | ICD-10-CM | POA: Diagnosis present

## 2024-11-07 DIAGNOSIS — R29898 Other symptoms and signs involving the musculoskeletal system: Secondary | ICD-10-CM | POA: Insufficient documentation

## 2024-11-07 DIAGNOSIS — M545 Low back pain, unspecified: Secondary | ICD-10-CM | POA: Diagnosis present

## 2024-11-07 DIAGNOSIS — M25512 Pain in left shoulder: Secondary | ICD-10-CM | POA: Diagnosis present

## 2024-11-07 NOTE — Therapy (Signed)
 " OUTPATIENT PHYSICAL THERAPY THORACOLUMBAR TREATMENT   Patient Name: Allison Gould MRN: [id number] DOB:01/12/1951, 74 y.o., female Today's Date: 11/07/2024  END OF SESSION:  PT End of Session - 11/07/24 1506     Visit Number 3    Number of Visits 12    Date for Recertification  12/01/24    Authorization Type [employer] dual complete    Authorization Time Period no auth needed    Progress Note Due on Visit 10    PT Start Time 1506    PT Stop Time 1545    PT Time Calculation (min) 39 min    Activity Tolerance Patient tolerated treatment well    Behavior During Therapy WFL for tasks assessed/performed          Past Medical History:  Diagnosis Date   Anxiety    CKD (chronic kidney disease)    COPD (chronic obstructive pulmonary disease) (HCC)    Diabetes mellitus type II    GERD (gastroesophageal reflux disease)    Gout    Hearing aid worn    right   Hyperparathyroidism    Hypertension    Lipid disorder    Loss of hair    for unknown reason, to see MD    Low back pain    Lupus (systemic lupus erythematosus) (HCC)    Morbid obesity with BMI of 45.0-49.9, adult [facility])    Osteoarthritis    Past Surgical History:  Procedure Laterality Date   CATARACT EXTRACTION W/PHACO Left 05/12/2016   Procedure: CATARACT EXTRACTION PHACO AND INTRAOCULAR LENS PLACEMENT (IOC);  Surgeon: Cherene Mania, MD;  Location: [facility];  Service: Ophthalmology;  Laterality: Left;  CDE: 6.63   CATARACT EXTRACTION W/PHACO Right 06/16/2016   Procedure: CATARACT EXTRACTION PHACO AND INTRAOCULAR LENS PLACEMENT (IOC); CDE:  4.13;  Surgeon: Cherene Mania, MD;  Location: [facility];  Service: Ophthalmology;  Laterality: Right;   COLONOSCOPY  07/12/2012   Procedure: COLONOSCOPY;  Surgeon: Margo LITTIE Haddock, MD;  Location: [facility];  Service: Endoscopy;  Laterality: N/A;  1:00   COLONOSCOPY N/A 03/21/2024   Procedure: COLONOSCOPY;  Surgeon: Cindie Carlin POUR, DO;  Location: [facility];  Service: Endoscopy;  Laterality: N/A;   1030AM, ASA 3   lipoma-right shoulder     spur and nerve repair right shoulder     Patient Active Problem List   Diagnosis Date Noted   Snoring 03/11/2024   Hyperkalemia 09/17/2022   Hypernatremia 09/17/2022   Localized edema 07/23/2022   Paresthesia of hand 07/23/2022   Congestive heart failure (HCC) 07/12/2022   Chronic obstructive lung disease (HCC) 01/27/2022   Morbid obesity (HCC) 01/27/2022   Edema of lower extremity 09/30/2021   Lupus erythematosus 09/25/2021   Chronic insomnia 08/21/2021   Chronic kidney disease 06/27/2021   Thrombocytopenic disorder 06/27/2021   Gastroesophageal reflux disease 05/24/2021   Hyperlipidemia 05/24/2021   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   Leg weakness 04/14/2013   Back pain 04/14/2013   Knee pain 04/14/2013   Pain in joint, shoulder region 03/11/2013   Muscle weakness (generalized) 03/11/2013   Pain in joint, ankle and foot 03/11/2013   Abnormality of gait 03/11/2013   Right ankle sprain 03/02/2013   Shoulder contusion 03/02/2013   Constipation 06/22/2012   Colon cancer screening 06/22/2012   Pain in limb 07/05/2009   FLANK PAIN, RIGHT 04/04/2009   ELECTROCARDIOGRAM, ABNORMAL 02/20/2009   DIABETES MELLITUS, WITH NEUROLOGICAL COMPLICATIONS 01/09/2009   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) 01/09/2009   Essential hypertension 10/13/2008   Osteoarthritis 10/13/2008   LOW BACK PAIN 10/13/2008    PCP: Joeann Browning, FNP  REFERRING PROVIDER: Joeann Browning, FNP  REFERRING DIAG: M54.50 (ICD-10-CM) - Low back pain, unspecified  Rationale for Evaluation and Treatment: Rehabilitation  THERAPY DIAG:  Low back pain, unspecified back pain laterality, unspecified chronicity, unspecified whether sciatica present  Other symptoms and signs involving the musculoskeletal system  Difficulty in walking, not elsewhere classified  ONSET DATE:  worse starting in August of this year  SUBJECTIVE:                                                                                                                                                                                           SUBJECTIVE STATEMENT: Pt reports her knees are bothering her most today, 9/10. Reports legs swelling. Reports her son has ordered compression garments, she's waiting on them to come in. Reports she's been using some type of massager for her legs and its been helping.    Eval:  Has low back pain that got worse back in August; went to ED; after kept hurting went back to the MD and told her about it; she referred to physical therapy; arrives today with [facility]  PERTINENT HISTORY:  Fall with left leg fracture several years ago Anxiety COPD DM HTN CKD Neuropathy? States her feet are numb but unable to say if neuropathy Gout Left knee gives way  PAIN:  Are you having pain? Yes: NPRS scale: 8/10; 10/10 at worst; 4/10 at best Pain location: low back right side Pain description: aching Aggravating factors: laying in certain positions; hard to bend over Relieving factors: muscle cream Aspercreme; Tylenol   PRECAUTIONS: Fall  RED FLAGS: None   WEIGHT BEARING RESTRICTIONS: No  FALLS:  Has patient fallen in last 6 months? No   OCCUPATION: retired  PLOF: Independent with household mobility with device and Independent with community mobility with device  PATIENT GOALS: get better so I can walk better  NEXT MD VISIT: 3rd month  OBJECTIVE:  Note: Objective measures were completed at Evaluation unless otherwise noted.  DIAGNOSTIC FINDINGS:  CLINICAL DATA:  Low back pain   EXAM: LUMBAR SPINE - 2-3 VIEW   COMPARISON:  11/13/2022   FINDINGS: Diffuse degenerative facet disease, most pronounced in the lower lumbar spine. 6 mm anterolisthesis of L4 on L5, stable. Disc spaces are maintained. No fracture. SI joints symmetric. Calcified  fibroid centrally in the pelvis.   IMPRESSION: Degenerative facet disease with stable slight anterolisthesis at L4-5.   No acute bony abnormality.     Electronically  Signed   By: Franky Crease M.D.   On: 06/22/2024 00:19  PATIENT SURVEYS:  Modified Oswestry:  MODIFIED OSWESTRY DISABILITY SCALE  Date: 10/20/2024 Score                                Total 29/50; 58%   Interpretation of scores: Score Category Description  0-20% Minimal Disability The patient can cope with most living activities. Usually no treatment is indicated apart from advice on lifting, sitting and exercise  21-40% Moderate Disability The patient experiences more pain and difficulty with sitting, lifting and standing. Travel and social life are more difficult and they may be disabled from work. Personal care, sexual activity and sleeping are not grossly affected, and the patient can usually be managed by conservative means  41-60% Severe Disability Pain remains the main problem in this group, but activities of daily living are affected. These patients require a detailed investigation  61-80% Crippled Back pain impinges on all aspects of the patients life. Positive intervention is required  81-100% Bed-bound These patients are either bed-bound or exaggerating their symptoms  Bluford FORBES Zoe DELENA Karon DELENA, et Allison. Surgery versus conservative management of stable thoracolumbar fracture: the PRESTO feasibility RCT. [place] ([place]): [employer]; 2021 Nov. [facility] Technology Assessment, No. 25.62.) Appendix 3, Oswestry Disability Index category descriptors. Available from: Findjewelers.cz  Minimally Clinically Important Difference (MCID) = 12.8%  COGNITION: Overall cognitive status: Within functional limits for tasks assessed     SENSATION: WFL;  legs are swollen lower portion  MUSCLE LENGTH: Hamstrings: next visit  POSTURE: increased lumbar lordosis and flexed  trunk   PALPATION: Tender right side low back  LUMBAR ROM:   AROM eval  Flexion Slow; guarded down to fingertips 5 above ankle  Extension To neutral only  Right lateral flexion   Left lateral flexion   Right rotation   Left rotation    (Blank rows = not tested)  LOWER EXTREMITY ROM:     Active  Right eval Left eval  Hip flexion    Hip extension    Hip abduction    Hip adduction    Hip internal rotation    Hip external rotation    Knee flexion    Knee extension    Ankle dorsiflexion    Ankle plantarflexion    Ankle inversion    Ankle eversion     (Blank rows = not tested)  LOWER EXTREMITY MMT:    MMT Right eval Left eval  Hip flexion 4 4  Hip extension    Hip abduction    Hip adduction    Hip internal rotation    Hip external rotation    Knee flexion    Knee extension 4+ 4+  Ankle dorsiflexion 4+ 4+ (sore big toe from dropping phone on her toe)  Ankle plantarflexion    Ankle inversion    Ankle eversion     (Blank rows = not tested)    FUNCTIONAL TESTS:  5 times sit to stand: 31.39 sec using hands to push up to standing 2 minute walk test: 94 ft  GAIT: Distance walked: 50 ft in clinic Assistive device utilized: Single point cane Level of assistance: Modified independence Comments: slow antalgic gait  TREATMENT DATE:  11/07/24: STS, 12, from 21.5 inch mat height Seated: LAQ, 2x10 each LE March, 2x10 each LE Supine:  Bridge, 2x10 TrA activation paired with cueing for breathing 5x 5 TA  contract + SLR, 10x each LE TA contract + Hip add/Abd, 5 holds, 10 of each, yellow ball and RTB around knees Standing heel raises at counter, 2x10 Standing mini squats at counter, 10x, verbal cues for form    11/01/24: Reviewed goals Educated importance of HEP compliance for maximal benefits Discussed benefits with compression garments for edema control Given ETI handout for thigh high compression garment STS  with UE on thigh 10x, eccentric control SLS  Rt 1, Lt 1-2 max of 5 attempts Supine: TrA activation paired with cueing for breathing 10x 5 Decompression 2-5 5x 5 Bridge 10x 5 partial raise  10/20/2024 physical therapy evaluation and HEP instruction                                                                                                                                 PATIENT EDUCATION:  Education details: Patient educated on exam findings, POC, scope of PT, HEP, and what to expect next visit. Person educated: Patient Education method: Explanation, Demonstration, and Handouts Education comprehension: verbalized understanding, returned demonstration, verbal cues required, and tactile cues required   HOME EXERCISE PROGRAM: Access Code: [email or web address] URL: [email or web address] Date: 10/20/2024 Prepared by: [facility]  Exercises - Sit to Stand with Armchair  - 2 x daily - 7 x weekly - 1 sets - 5 reps - Seated Transversus Abdominis Bracing  - 2 x daily - 7 x weekly - 1 sets - 10 reps - 5 sec hold  11/01/24: -Decompression exercises ETI handout for thigh high compression garments  ASSESSMENT:  CLINICAL IMPRESSION: Pt arrives to session with reports of improved back pain but reports increased knee pain this date. Began session with familiar STS exercises. PT demonstrates slow and labored movement during exercises and reports increased fatigue. To address knee pain, added LAQ and marching in session with pt tolerating well. Followed with supine hip and core strengthening. Added in SLR with TA contraction for added challenge. Pt with inc fatigue and SOB. Addressed with verbal cues for pursed lip breathing. Ended with standing LE strengthening, pt requiring standing rest break throughout. Discussion this date on sleeping positions with pt reporting feeling better semi-reclined with her LE propped up to relieve pressure on her back. Printed off leg wedge example from [employer] for pt to have family assist with purchasing  if wanted. Patient will benefit from continued skilled physical therapy in order to address current deficits to improve pain and overall function.      Eval:  Patient is a 74 y.o. female who was seen today for physical therapy evaluation and treatment for M54.50 (ICD-10-CM) - Low back pain, unspecified.  Patient demonstrates muscle weakness, reduced ROM, and fascial restrictions which are likely contributing to symptoms of pain and are negatively impacting patient ability to perform ADLs and functional mobility tasks. Patient will benefit from skilled physical therapy services to address these deficits to reduce pain and improve level of function with ADLs  and functional mobility tasks.   OBJECTIVE IMPAIRMENTS: Abnormal gait, decreased activity tolerance, decreased ROM, decreased strength, and pain.   ACTIVITY LIMITATIONS: carrying, lifting, bending, sitting, standing, squatting, sleeping, stairs, bathing, and locomotion level  PARTICIPATION LIMITATIONS: meal prep, cleaning, laundry, driving, shopping, community activity, and church  PERSONAL FACTORS: 3+ comorbidities: DM, HTN, CKD are also affecting patient's functional outcome.   REHAB POTENTIAL: Good  CLINICAL DECISION MAKING: Evolving/moderate complexity  EVALUATION COMPLEXITY: Moderate   GOALS: Goals reviewed with patient? No  SHORT TERM GOALS: Target date: 11/11/2023  patient will be independent with initial HEP and compliant with HEP 3-4 times a week   Baseline: Goal status: INITIAL  2.  Patient will report 50% improvement overall   Baseline:  Goal status: INITIAL    LONG TERM GOALS: Target date: 12/01/2024  Patient will be independent in self management strategies to improve quality of life and functional outcomes.  Baseline:  Goal status: INITIAL  2.  Patient will report 70% improvement overall   Baseline:  Goal status: INITIAL  3.  Patient will improve 5 times sit to stand score to 20 sec or less to  demonstrate improved functional mobility and increased leg strength.    Baseline: 31.39 sec Goal status: INITIAL  4.  Patient will increase distance on 2 MWT to 150 ft or more with LRAD to demonstrate improved speed and efficiency with household and community ambulation  Baseline: 94 ft with SPC Goal status: INITIAL  PLAN:  PT FREQUENCY: 2x/week  PT DURATION: 6 weeks  PLANNED INTERVENTIONS: [id number]- PT Re-evaluation, 97110-Therapeutic exercises, 97530- Therapeutic activity, 97112- Neuromuscular re-education, 97535- Self Care, [id number]- Manual therapy, [id number]- Gait training, [phone]- Orthotic Fit/training, [phone]- Canalith repositioning, [id number]- Aquatic Therapy, 97760- Splinting, 97597- Wound care (first 20 sq cm), 97598- Wound care (each additional 20 sq cm)Patient/Family education, Balance training, Stair training, Taping, Dry Needling, Joint mobilization, Joint manipulation, Spinal manipulation, Spinal mobilization, Scar mobilization, and DME instructions. SABRA  PLAN FOR NEXT SESSION:  core and lower extremity strengthening; postural strengthening   3:51 PM, 11/07/2024 Rosaria Settler, PT, DPT [facility] [place]  "

## 2024-11-10 ENCOUNTER — Ambulatory Visit (HOSPITAL_COMMUNITY): Admitting: Physical Therapy

## 2024-11-10 DIAGNOSIS — M545 Low back pain, unspecified: Secondary | ICD-10-CM

## 2024-11-10 DIAGNOSIS — R262 Difficulty in walking, not elsewhere classified: Secondary | ICD-10-CM

## 2024-11-10 DIAGNOSIS — R29898 Other symptoms and signs involving the musculoskeletal system: Secondary | ICD-10-CM

## 2024-11-10 DIAGNOSIS — M25512 Pain in left shoulder: Secondary | ICD-10-CM

## 2024-11-10 NOTE — Therapy (Addendum)
 " OUTPATIENT PHYSICAL THERAPY THORACOLUMBAR TREATMENT   Patient Name: Allison Gould MRN: [id number] DOB:September 04, 1951, 74 y.o., female Today's Date: 11/10/2024  END OF SESSION:  PT End of Session - 11/10/24 1446     Visit Number 4    Number of Visits 12    Date for Recertification  12/01/24    Authorization Type [employer] dual complete    Authorization Time Period no auth needed    Progress Note Due on Visit 10    PT Start Time 1332    PT Stop Time 1415    PT Time Calculation (min) 43 min    Activity Tolerance Patient tolerated treatment well    Behavior During Therapy WFL for tasks assessed/performed           Past Medical History:  Diagnosis Date   Anxiety    CKD (chronic kidney disease)    COPD (chronic obstructive pulmonary disease) (HCC)    Diabetes mellitus type II    GERD (gastroesophageal reflux disease)    Gout    Hearing aid worn    right   Hyperparathyroidism    Hypertension    Lipid disorder    Loss of hair    for unknown reason, to see MD    Low back pain    Lupus (systemic lupus erythematosus) (HCC)    Morbid obesity with BMI of 45.0-49.9, adult [facility])    Osteoarthritis    Past Surgical History:  Procedure Laterality Date   CATARACT EXTRACTION W/PHACO Left 05/12/2016   Procedure: CATARACT EXTRACTION PHACO AND INTRAOCULAR LENS PLACEMENT (IOC);  Surgeon: Cherene Mania, MD;  Location: [facility];  Service: Ophthalmology;  Laterality: Left;  CDE: 6.63   CATARACT EXTRACTION W/PHACO Right 06/16/2016   Procedure: CATARACT EXTRACTION PHACO AND INTRAOCULAR LENS PLACEMENT (IOC); CDE:  4.13;  Surgeon: Cherene Mania, MD;  Location: [facility];  Service: Ophthalmology;  Laterality: Right;   COLONOSCOPY  07/12/2012   Procedure: COLONOSCOPY;  Surgeon: Margo LITTIE Haddock, MD;  Location: [facility];  Service: Endoscopy;  Laterality: N/A;  1:00   COLONOSCOPY N/A 03/21/2024   Procedure: COLONOSCOPY;  Surgeon: Cindie Carlin POUR, DO;  Location: [facility];  Service: Endoscopy;  Laterality: N/A;   1030AM, ASA 3   lipoma-right shoulder     spur and nerve repair right shoulder     Patient Active Problem List   Diagnosis Date Noted   Snoring 03/11/2024   Hyperkalemia 09/17/2022   Hypernatremia 09/17/2022   Localized edema 07/23/2022   Paresthesia of hand 07/23/2022   Congestive heart failure (HCC) 07/12/2022   Chronic obstructive lung disease (HCC) 01/27/2022   Morbid obesity (HCC) 01/27/2022   Edema of lower extremity 09/30/2021   Lupus erythematosus 09/25/2021   Chronic insomnia 08/21/2021   Chronic kidney disease 06/27/2021   Thrombocytopenic disorder 06/27/2021   Gastroesophageal reflux disease 05/24/2021   Hyperlipidemia 05/24/2021   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   Leg weakness 04/14/2013   Back pain 04/14/2013   Knee pain 04/14/2013   Pain in joint, shoulder region 03/11/2013   Muscle weakness (generalized) 03/11/2013   Pain in joint, ankle and foot 03/11/2013   Abnormality of gait 03/11/2013   Right ankle sprain 03/02/2013   Shoulder contusion 03/02/2013   Constipation 06/22/2012   Colon cancer screening 06/22/2012   Pain in limb 07/05/2009   FLANK PAIN, RIGHT 04/04/2009   ELECTROCARDIOGRAM, ABNORMAL 02/20/2009   DIABETES MELLITUS, WITH NEUROLOGICAL COMPLICATIONS 01/09/2009   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) 01/09/2009   Essential hypertension 10/13/2008   Osteoarthritis 10/13/2008   LOW BACK PAIN 10/13/2008    PCP: Joeann Browning, FNP  REFERRING PROVIDER: Joeann Browning, FNP  REFERRING DIAG: M54.50 (ICD-10-CM) - Low back pain, unspecified  Rationale for Evaluation and Treatment: Rehabilitation  THERAPY DIAG:  Low back pain, unspecified back pain laterality, unspecified chronicity, unspecified whether sciatica present  Other symptoms and signs involving the musculoskeletal system  Difficulty in walking, not elsewhere classified  Acute pain  of left shoulder  ONSET DATE: worse starting in August of this year  SUBJECTIVE:                                                                                                                                                                                           SUBJECTIVE STATEMENT: Pt states her son has not ordererd her compression stockings yet.  Lt knee hurts worse than Rt and varies up to 10/10.  Keep her up at night sometimes. States it feels tight.      Eval:  Has low back pain that got worse back in August; went to ED; after kept hurting went back to the MD and told her about it; she referred to physical therapy; arrives today with [facility]  PERTINENT HISTORY:  Fall with left leg fracture several years ago Anxiety COPD DM HTN CKD Neuropathy? States her feet are numb but unable to say if neuropathy Gout Left knee gives way  PAIN:  Are you having pain? Yes: NPRS scale: 8/10; 10/10 at worst; 4/10 at best Pain location: low back right side Pain description: aching Aggravating factors: laying in certain positions; hard to bend over Relieving factors: muscle cream Aspercreme; Tylenol   PRECAUTIONS: Fall  RED FLAGS: None   WEIGHT BEARING RESTRICTIONS: No  FALLS:  Has patient fallen in last 6 months? No   OCCUPATION: retired  PLOF: Independent with household mobility with device and Independent with community mobility with device  PATIENT GOALS: get better so I can walk better  NEXT MD VISIT: 3rd month  OBJECTIVE:  Note: Objective measures were completed at Evaluation unless otherwise noted.  DIAGNOSTIC FINDINGS:  CLINICAL DATA:  Low back pain   EXAM: LUMBAR SPINE - 2-3 VIEW   COMPARISON:  11/13/2022   FINDINGS: Diffuse degenerative facet disease, most pronounced in the lower lumbar spine. 6 mm anterolisthesis of L4 on L5, stable. Disc spaces are maintained. No fracture. SI joints symmetric. Calcified fibroid centrally in the pelvis.    IMPRESSION: Degenerative facet disease with stable slight anterolisthesis at L4-5.   No acute bony abnormality.  Electronically Signed   By: Franky Crease M.D.   On: 06/22/2024 00:19  PATIENT SURVEYS:  Modified Oswestry:  MODIFIED OSWESTRY DISABILITY SCALE  Date: 10/20/2024 Score  Total 29/50; 58%   Interpretation of scores: Score Category Description  0-20% Minimal Disability The patient can cope with most living activities. Usually no treatment is indicated apart from advice on lifting, sitting and exercise  21-40% Moderate Disability The patient experiences more pain and difficulty with sitting, lifting and standing. Travel and social life are more difficult and they may be disabled from work. Personal care, sexual activity and sleeping are not grossly affected, and the patient can usually be managed by conservative means  41-60% Severe Disability Pain remains the main problem in this group, but activities of daily living are affected. These patients require a detailed investigation  61-80% Crippled Back pain impinges on all aspects of the patients life. Positive intervention is required  81-100% Bed-bound These patients are either bed-bound or exaggerating their symptoms  Bluford FORBES Zoe DELENA Karon DELENA, et al. Surgery versus conservative management of stable thoracolumbar fracture: the PRESTO feasibility RCT. [place] ([place]): [employer]; 2021 Nov. [facility] Technology Assessment, No. 25.62.) Appendix 3, Oswestry Disability Index category descriptors. Available from: Findjewelers.cz  Minimally Clinically Important Difference (MCID) = 12.8%  COGNITION: Overall cognitive status: Within functional limits for tasks assessed     SENSATION: WFL;  legs are swollen lower portion  MUSCLE LENGTH: Hamstrings: next visit  POSTURE: increased lumbar lordosis and flexed trunk   PALPATION: Tender right side low back  LUMBAR ROM:   AROM eval   Flexion Slow; guarded down to fingertips 5 above ankle  Extension To neutral only  Right lateral flexion   Left lateral flexion   Right rotation   Left rotation    (Blank rows = not tested)  LOWER EXTREMITY ROM:     Active  Right eval Left eval  Hip flexion    Hip extension    Hip abduction    Hip adduction    Hip internal rotation    Hip external rotation    Knee flexion    Knee extension    Ankle dorsiflexion    Ankle plantarflexion    Ankle inversion    Ankle eversion     (Blank rows = not tested)  LOWER EXTREMITY MMT:    MMT Right eval Left eval  Hip flexion 4 4  Hip extension    Hip abduction    Hip adduction    Hip internal rotation    Hip external rotation    Knee flexion    Knee extension 4+ 4+  Ankle dorsiflexion 4+ 4+ (sore big toe from dropping phone on her toe)  Ankle plantarflexion    Ankle inversion    Ankle eversion     (Blank rows = not tested)    FUNCTIONAL TESTS:  5 times sit to stand: 31.39 sec using hands to push up to standing 2 minute walk test: 94 ft  GAIT: Distance walked: 50 ft in clinic Assistive device utilized: Single point cane Level of assistance: Modified independence Comments: slow antalgic gait  TREATMENT DATE:  11/10/24: Seated: LAQ, 2x10 each LE March, 2x10 each LE Sit to stands 10X no UE Supine with elevation  Bridge 2X10  TrA isometric 10X5  TrA with SLR 2X10 each LE  TrA with hip add ball squeeze 5 holds 2X10  TrA with hip abduction GTB resistance around knees 2X10 Standing:  at counter with bil UE  assist  Heel raises 20X  Hip abduction 10X each   11/07/24: STS, 12, from 21.5 inch mat height Seated: LAQ, 2x10 each LE March, 2x10 each LE Supine:  Bridge, 2x10 TrA activation paired with cueing for breathing 5x 5 TA contract + SLR, 10x each LE TA contract + Hip add/Abd, 5 holds, 10 of each, yellow ball and RTB around knees Standing heel raises at counter, 2x10 Standing mini squats at counter,  10x, verbal cues for form    11/01/24: Reviewed goals Educated importance of HEP compliance for maximal benefits Discussed benefits with compression garments for edema control Given ETI handout for thigh high compression garment STS  with UE on thigh 10x, eccentric control SLS Rt 1, Lt 1-2 max of 5 attempts Supine: TrA activation paired with cueing for breathing 10x 5 Decompression 2-5 5x 5 Bridge 10x 5 partial raise  10/20/2024 physical therapy evaluation and HEP instruction                                                                                                                                 PATIENT EDUCATION:  Education details: Patient educated on exam findings, POC, scope of PT, HEP, and what to expect next visit. Person educated: Patient Education method: Explanation, Demonstration, and Handouts Education comprehension: verbalized understanding, returned demonstration, verbal cues required, and tactile cues required   HOME EXERCISE PROGRAM: Access Code: [email or web address] URL: [email or web address] Date: 10/20/2024 Prepared by: [facility] - Rehab  Exercises - Sit to Stand with Armchair  - 2 x daily - 7 x weekly - 1 sets - 5 reps - Seated Transversus Abdominis Bracing  - 2 x daily - 7 x weekly - 1 sets - 10 reps - 5 sec hold  11/01/24: -Decompression exercises ETI handout for thigh high compression garments  ASSESSMENT:  CLINICAL IMPRESSION: Pt educated on lymphedema and informed of treatment provided here and how we could help.  Continued with LE strengthening and ROM exercises. Seated exercises completed, not quite ready for addition of weights this session due to weakness.   PT requires elevation in order to lay supine due to her breathing.  Pt demonstrated core stabilization with all LE exercises this session.  Ended with standing LE strengthening, pt required seated rest break at end of session before exiting gym.  Patient will continue to benefit from  continued skilled physical therapy in order to address current deficits to improve pain and overall function.      Eval:  Patient is a 74 y.o. female who was seen today for physical therapy evaluation and treatment for M54.50 (ICD-10-CM) - Low back pain, unspecified.  Patient demonstrates muscle weakness, reduced ROM, and fascial restrictions which are likely contributing to symptoms of pain and are negatively impacting patient ability to perform ADLs and functional mobility tasks. Patient will benefit from skilled physical therapy services to address these deficits to reduce pain and improve level of function with ADLs and functional mobility  tasks.   OBJECTIVE IMPAIRMENTS: Abnormal gait, decreased activity tolerance, decreased ROM, decreased strength, and pain.   ACTIVITY LIMITATIONS: carrying, lifting, bending, sitting, standing, squatting, sleeping, stairs, bathing, and locomotion level  PARTICIPATION LIMITATIONS: meal prep, cleaning, laundry, driving, shopping, community activity, and church  PERSONAL FACTORS: 3+ comorbidities: DM, HTN, CKD are also affecting patient's functional outcome.   REHAB POTENTIAL: Good  CLINICAL DECISION MAKING: Evolving/moderate complexity  EVALUATION COMPLEXITY: Moderate   GOALS: Goals reviewed with patient? No  SHORT TERM GOALS: Target date: 11/11/2023  patient will be independent with initial HEP and compliant with HEP 3-4 times a week   Baseline: Goal status: INITIAL  2.  Patient will report 50% improvement overall   Baseline:  Goal status: INITIAL    LONG TERM GOALS: Target date: 12/01/2024  Patient will be independent in self management strategies to improve quality of life and functional outcomes.  Baseline:  Goal status: INITIAL  2.  Patient will report 70% improvement overall   Baseline:  Goal status: INITIAL  3.  Patient will improve 5 times sit to stand score to 20 sec or less to demonstrate improved functional mobility  and increased leg strength.    Baseline: 31.39 sec Goal status: INITIAL  4.  Patient will increase distance on 2 MWT to 150 ft or more with LRAD to demonstrate improved speed and efficiency with household and community ambulation  Baseline: 94 ft with SPC Goal status: INITIAL  PLAN:  PT FREQUENCY: 2x/week  PT DURATION: 6 weeks  PLANNED INTERVENTIONS: [id number]- PT Re-evaluation, 97110-Therapeutic exercises, 97530- Therapeutic activity, 97112- Neuromuscular re-education, 97535- Self Care, [id number]- Manual therapy, [id number]- Gait training, [phone]- Orthotic Fit/training, [phone]- Canalith repositioning, [id number]- Aquatic Therapy, 97760- Splinting, 97597- Wound care (first 20 sq cm), 97598- Wound care (each additional 20 sq cm)Patient/Family education, Balance training, Stair training, Taping, Dry Needling, Joint mobilization, Joint manipulation, Spinal manipulation, Spinal mobilization, Scar mobilization, and DME instructions. SABRA  PLAN FOR NEXT SESSION:  core and lower extremity strengthening; postural strengthening.  Progress standing exercises.  Greig KATHEE Fuse, PTA/CLT [facility] Ph: [phone]  2:48 PM, 11/10/2024  "

## 2024-11-16 ENCOUNTER — Ambulatory Visit (HOSPITAL_COMMUNITY)

## 2024-11-16 DIAGNOSIS — R29898 Other symptoms and signs involving the musculoskeletal system: Secondary | ICD-10-CM

## 2024-11-16 DIAGNOSIS — M545 Low back pain, unspecified: Secondary | ICD-10-CM

## 2024-11-16 DIAGNOSIS — R262 Difficulty in walking, not elsewhere classified: Secondary | ICD-10-CM

## 2024-11-16 NOTE — Therapy (Signed)
 " OUTPATIENT PHYSICAL THERAPY THORACOLUMBAR TREATMENT   Patient Name: Lulamae Skorupski MRN: [id number] DOB:Dec 28, 1950, 74 y.o., female Today's Date: 11/16/2024  END OF SESSION:  PT End of Session - 11/16/24 1457     Visit Number 5    Number of Visits 12    Date for Recertification  12/01/24    Authorization Type [employer] dual complete    Authorization Time Period no auth needed    Progress Note Due on Visit 10    PT Start Time 1457    PT Stop Time 1537    PT Time Calculation (min) 40 min    Activity Tolerance Patient tolerated treatment well    Behavior During Therapy WFL for tasks assessed/performed           Past Medical History:  Diagnosis Date   Anxiety    CKD (chronic kidney disease)    COPD (chronic obstructive pulmonary disease) (HCC)    Diabetes mellitus type II    GERD (gastroesophageal reflux disease)    Gout    Hearing aid worn    right   Hyperparathyroidism    Hypertension    Lipid disorder    Loss of hair    for unknown reason, to see MD    Low back pain    Lupus (systemic lupus erythematosus) (HCC)    Morbid obesity with BMI of 45.0-49.9, adult [facility])    Osteoarthritis    Past Surgical History:  Procedure Laterality Date   CATARACT EXTRACTION W/PHACO Left 05/12/2016   Procedure: CATARACT EXTRACTION PHACO AND INTRAOCULAR LENS PLACEMENT (IOC);  Surgeon: Cherene Mania, MD;  Location: [facility];  Service: Ophthalmology;  Laterality: Left;  CDE: 6.63   CATARACT EXTRACTION W/PHACO Right 06/16/2016   Procedure: CATARACT EXTRACTION PHACO AND INTRAOCULAR LENS PLACEMENT (IOC); CDE:  4.13;  Surgeon: Cherene Mania, MD;  Location: [facility];  Service: Ophthalmology;  Laterality: Right;   COLONOSCOPY  07/12/2012   Procedure: COLONOSCOPY;  Surgeon: Margo LITTIE Haddock, MD;  Location: [facility];  Service: Endoscopy;  Laterality: N/A;  1:00   COLONOSCOPY N/A 03/21/2024   Procedure: COLONOSCOPY;  Surgeon: Cindie Carlin POUR, DO;  Location: [facility];  Service: Endoscopy;  Laterality: N/A;   1030AM, ASA 3   lipoma-right shoulder     spur and nerve repair right shoulder     Patient Active Problem List   Diagnosis Date Noted   Snoring 03/11/2024   Hyperkalemia 09/17/2022   Hypernatremia 09/17/2022   Localized edema 07/23/2022   Paresthesia of hand 07/23/2022   Congestive heart failure (HCC) 07/12/2022   Chronic obstructive lung disease (HCC) 01/27/2022   Morbid obesity (HCC) 01/27/2022   Edema of lower extremity 09/30/2021   Lupus erythematosus 09/25/2021   Chronic insomnia 08/21/2021   Chronic kidney disease 06/27/2021   Thrombocytopenic disorder 06/27/2021   Gastroesophageal reflux disease 05/24/2021   Hyperlipidemia 05/24/2021   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   Leg weakness 04/14/2013   Back pain 04/14/2013   Knee pain 04/14/2013   Pain in joint, shoulder region 03/11/2013   Muscle weakness (generalized) 03/11/2013   Pain in joint, ankle and foot 03/11/2013   Abnormality of gait 03/11/2013   Right ankle sprain 03/02/2013   Shoulder contusion 03/02/2013   Constipation 06/22/2012   Colon cancer screening 06/22/2012   Pain in limb 07/05/2009   FLANK PAIN, RIGHT 04/04/2009   ELECTROCARDIOGRAM, ABNORMAL 02/20/2009   DIABETES MELLITUS, WITH NEUROLOGICAL COMPLICATIONS 01/09/2009   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) 01/09/2009   Essential hypertension 10/13/2008   Osteoarthritis 10/13/2008   LOW BACK PAIN 10/13/2008    PCP: Joeann Browning, FNP  REFERRING PROVIDER: Joeann Browning, FNP  REFERRING DIAG: M54.50 (ICD-10-CM) - Low back pain, unspecified  Rationale for Evaluation and Treatment: Rehabilitation  THERAPY DIAG:  Low back pain, unspecified back pain laterality, unspecified chronicity, unspecified whether sciatica present  Other symptoms and signs involving the musculoskeletal system  Difficulty in walking, not elsewhere classified  ONSET DATE:  worse starting in August of this year  SUBJECTIVE:                                                                                                                                                                                           SUBJECTIVE STATEMENT: Busy day today; had a dentist appt this morning.  back feels alright today; hurt some last night and this morning.  Exercises seem to help.    Eval:  Has low back pain that got worse back in August; went to ED; after kept hurting went back to the MD and told her about it; she referred to physical therapy; arrives today with [facility]  PERTINENT HISTORY:  Fall with left leg fracture several years ago Anxiety COPD DM HTN CKD Neuropathy? States her feet are numb but unable to say if neuropathy Gout Left knee gives way  PAIN:  Are you having pain? Yes: NPRS scale: 8/10; 10/10 at worst; 4/10 at best Pain location: low back right side Pain description: aching Aggravating factors: laying in certain positions; hard to bend over Relieving factors: muscle cream Aspercreme; Tylenol   PRECAUTIONS: Fall  RED FLAGS: None   WEIGHT BEARING RESTRICTIONS: No  FALLS:  Has patient fallen in last 6 months? No   OCCUPATION: retired  PLOF: Independent with household mobility with device and Independent with community mobility with device  PATIENT GOALS: get better so I can walk better  NEXT MD VISIT: 3rd month  OBJECTIVE:  Note: Objective measures were completed at Evaluation unless otherwise noted.  DIAGNOSTIC FINDINGS:  CLINICAL DATA:  Low back pain   EXAM: LUMBAR SPINE - 2-3 VIEW   COMPARISON:  11/13/2022   FINDINGS: Diffuse degenerative facet disease, most pronounced in the lower lumbar spine. 6 mm anterolisthesis of L4 on L5, stable. Disc spaces are maintained. No fracture. SI joints symmetric. Calcified fibroid centrally in the pelvis.   IMPRESSION: Degenerative facet disease with stable slight anterolisthesis  at L4-5.   No acute bony abnormality.     Electronically Signed   By: Franky Crease M.D.   On: 06/22/2024 00:19  PATIENT  SURVEYS:  Modified Oswestry:  MODIFIED OSWESTRY DISABILITY SCALE  Date: 10/20/2024 Score  Total 29/50; 58%   Interpretation of scores: Score Category Description  0-20% Minimal Disability The patient can cope with most living activities. Usually no treatment is indicated apart from advice on lifting, sitting and exercise  21-40% Moderate Disability The patient experiences more pain and difficulty with sitting, lifting and standing. Travel and social life are more difficult and they may be disabled from work. Personal care, sexual activity and sleeping are not grossly affected, and the patient can usually be managed by conservative means  41-60% Severe Disability Pain remains the main problem in this group, but activities of daily living are affected. These patients require a detailed investigation  61-80% Crippled Back pain impinges on all aspects of the patients life. Positive intervention is required  81-100% Bed-bound These patients are either bed-bound or exaggerating their symptoms  Bluford FORBES Zoe DELENA Karon DELENA, et al. Surgery versus conservative management of stable thoracolumbar fracture: the PRESTO feasibility RCT. [place] ([place]): [employer]; 2021 Nov. [facility] Technology Assessment, No. 25.62.) Appendix 3, Oswestry Disability Index category descriptors. Available from: Findjewelers.cz  Minimally Clinically Important Difference (MCID) = 12.8%  COGNITION: Overall cognitive status: Within functional limits for tasks assessed     SENSATION: WFL;  legs are swollen lower portion  MUSCLE LENGTH: Hamstrings: next visit  POSTURE: increased lumbar lordosis and flexed trunk   PALPATION: Tender right side low back  LUMBAR ROM:   AROM eval  Flexion Slow; guarded down to fingertips 5 above ankle  Extension To  neutral only  Right lateral flexion   Left lateral flexion   Right rotation   Left rotation    (Blank rows = not tested)  LOWER EXTREMITY ROM:     Active  Right eval Left eval  Hip flexion    Hip extension    Hip abduction    Hip adduction    Hip internal rotation    Hip external rotation    Knee flexion    Knee extension    Ankle dorsiflexion    Ankle plantarflexion    Ankle inversion    Ankle eversion     (Blank rows = not tested)  LOWER EXTREMITY MMT:    MMT Right eval Left eval  Hip flexion 4 4  Hip extension    Hip abduction    Hip adduction    Hip internal rotation    Hip external rotation    Knee flexion    Knee extension 4+ 4+  Ankle dorsiflexion 4+ 4+ (sore big toe from dropping phone on her toe)  Ankle plantarflexion    Ankle inversion    Ankle eversion     (Blank rows = not tested)    FUNCTIONAL TESTS:  5 times sit to stand: 31.39 sec using hands to push up to standing 2 minute walk test: 94 ft  GAIT: Distance walked: 50 ft in clinic Assistive device utilized: Single point cane Level of assistance: Modified independence Comments: slow antalgic gait  TREATMENT DATE:  11/16/24 Seated: (4 box under feet) Toe raises x 20 Heel raises x 20 Abdominal bracing with hip adduction with ball 5 x 10 Abdominal bracing with hip abduction with  red theraband 2 x 10 Hip flexion with red theraband 2 x 10 each LAQ's 1# 2 x 10 each Sit to stand 2 x 5 Standing: Heel raises x 10 4 step ups x 5 each with bilateral UE assist   11/10/24: Seated:  LAQ, 2x10 each LE March, 2x10 each LE Sit to stands 10X no UE Supine with elevation  Bridge 2X10  TrA isometric 10X5  TrA with SLR 2X10 each LE  TrA with hip add ball squeeze 5 holds 2X10  TrA with hip abduction GTB resistance around knees 2X10 Standing:  at counter with bil UE assist  Heel raises 20X  Hip abduction 10X each   11/07/24: STS, 12, from 21.5 inch mat height Seated: LAQ, 2x10 each  LE March, 2x10 each LE Supine:  Bridge, 2x10 TrA activation paired with cueing for breathing 5x 5 TA contract + SLR, 10x each LE TA contract + Hip add/Abd, 5 holds, 10 of each, yellow ball and RTB around knees Standing heel raises at counter, 2x10 Standing mini squats at counter, 10x, verbal cues for form    11/01/24: Reviewed goals Educated importance of HEP compliance for maximal benefits Discussed benefits with compression garments for edema control Given ETI handout for thigh high compression garment STS  with UE on thigh 10x, eccentric control SLS Rt 1, Lt 1-2 max of 5 attempts Supine: TrA activation paired with cueing for breathing 10x 5 Decompression 2-5 5x 5 Bridge 10x 5 partial raise  10/20/2024 physical therapy evaluation and HEP instruction                                                                                                                                 PATIENT EDUCATION:  Education details: Patient educated on exam findings, POC, scope of PT, HEP, and what to expect next visit. Person educated: Patient Education method: Explanation, Demonstration, and Handouts Education comprehension: verbalized understanding, returned demonstration, verbal cues required, and tactile cues required   HOME EXERCISE PROGRAM: Access Code: [email or web address] URL: [email or web address] Date: 10/20/2024 Prepared by: [facility] - [facility]  Exercises - Sit to Stand with Armchair  - 2 x daily - 7 x weekly - 1 sets - 5 reps - Seated Transversus Abdominis Bracing  - 2 x daily - 7 x weekly - 1 sets - 10 reps - 5 sec hold  11/01/24: -Decompression exercises ETI handout for thigh high compression garments  ASSESSMENT:  CLINICAL IMPRESSION: Patient continues to walk with [facility] with antalgic gait and decreased gait speed.  Discussed again with her lymphedema and how it could help her.  Added in some resistance today with seated exercise and updated her HEP.  Issued theraband  for home use.  Slow and cautious with sit to stand with no UE assist.  Reports compliance with HEP and demonstrates familiarity with HEP exercises during treatment.  Added step ups today; noted patient with significant reliance on bilateral Ue's with step ups.  Patient will benefit from continued skilled therapy services to address deficits and promote return to optimal function.        Eval:  Patient is a 74 y.o. female who was seen today for physical therapy evaluation and treatment for M54.50 (ICD-10-CM) - Low  back pain, unspecified.  Patient demonstrates muscle weakness, reduced ROM, and fascial restrictions which are likely contributing to symptoms of pain and are negatively impacting patient ability to perform ADLs and functional mobility tasks. Patient will benefit from skilled physical therapy services to address these deficits to reduce pain and improve level of function with ADLs and functional mobility tasks.   OBJECTIVE IMPAIRMENTS: Abnormal gait, decreased activity tolerance, decreased ROM, decreased strength, and pain.   ACTIVITY LIMITATIONS: carrying, lifting, bending, sitting, standing, squatting, sleeping, stairs, bathing, and locomotion level  PARTICIPATION LIMITATIONS: meal prep, cleaning, laundry, driving, shopping, community activity, and church  PERSONAL FACTORS: 3+ comorbidities: DM, HTN, CKD are also affecting patient's functional outcome.   REHAB POTENTIAL: Good  CLINICAL DECISION MAKING: Evolving/moderate complexity  EVALUATION COMPLEXITY: Moderate   GOALS: Goals reviewed with patient? No  SHORT TERM GOALS: Target date: 11/11/2023  patient will be independent with initial HEP and compliant with HEP 3-4 times a week   Baseline: Goal status: INITIAL  2.  Patient will report 50% improvement overall   Baseline:  Goal status: INITIAL    LONG TERM GOALS: Target date: 12/01/2024  Patient will be independent in self management strategies to improve quality  of life and functional outcomes.  Baseline:  Goal status: INITIAL  2.  Patient will report 70% improvement overall   Baseline:  Goal status: INITIAL  3.  Patient will improve 5 times sit to stand score to 20 sec or less to demonstrate improved functional mobility and increased leg strength.    Baseline: 31.39 sec Goal status: INITIAL  4.  Patient will increase distance on 2 MWT to 150 ft or more with LRAD to demonstrate improved speed and efficiency with household and community ambulation  Baseline: 94 ft with SPC Goal status: INITIAL  PLAN:  PT FREQUENCY: 2x/week  PT DURATION: 6 weeks  PLANNED INTERVENTIONS: [id number]- PT Re-evaluation, 97110-Therapeutic exercises, 97530- Therapeutic activity, 97112- Neuromuscular re-education, 97535- Self Care, [id number]- Manual therapy, [id number]- Gait training, [id number]- Orthotic Fit/training, [phone]- Canalith repositioning, [id number]- Aquatic Therapy, 97760- Splinting, 97597- Wound care (first 20 sq cm), 97598- Wound care (each additional 20 sq cm)Patient/Family education, Balance training, Stair training, Taping, Dry Needling, Joint mobilization, Joint manipulation, Spinal manipulation, Spinal mobilization, Scar mobilization, and DME instructions. SABRA  PLAN FOR NEXT SESSION:  core and lower extremity strengthening; postural strengthening.  Progress standing exercises.  3:36 PM, 11/16/2024 Oceane Fosse Small Nollan Muldrow MPT [facility] [place] [phone] Ph:[phone]  "

## 2024-11-18 ENCOUNTER — Ambulatory Visit (HOSPITAL_COMMUNITY)

## 2024-11-18 DIAGNOSIS — R29898 Other symptoms and signs involving the musculoskeletal system: Secondary | ICD-10-CM

## 2024-11-18 DIAGNOSIS — R262 Difficulty in walking, not elsewhere classified: Secondary | ICD-10-CM

## 2024-11-18 DIAGNOSIS — M545 Low back pain, unspecified: Secondary | ICD-10-CM

## 2024-11-18 NOTE — Therapy (Signed)
 " OUTPATIENT PHYSICAL THERAPY THORACOLUMBAR TREATMENT   Patient Name: Atiyana Gould MRN: [id number] DOB:August 31, 1951, 74 y.o., female Today's Date: 11/18/2024  END OF SESSION:  PT End of Session - 11/18/24 1426     Visit Number 6    Number of Visits 12    Date for Recertification  12/01/24    Authorization Type [employer] dual complete    Authorization Time Period no auth needed    Progress Note Due on Visit 10    PT Start Time 1420    PT Stop Time 1500    PT Time Calculation (min) 40 min    Activity Tolerance Patient tolerated treatment well    Behavior During Therapy WFL for tasks assessed/performed           Past Medical History:  Diagnosis Date   Anxiety    CKD (chronic kidney disease)    COPD (chronic obstructive pulmonary disease) (HCC)    Diabetes mellitus type II    GERD (gastroesophageal reflux disease)    Gout    Hearing aid worn    right   Hyperparathyroidism    Hypertension    Lipid disorder    Loss of hair    for unknown reason, to see MD    Low back pain    Lupus (systemic lupus erythematosus) (HCC)    Morbid obesity with BMI of 45.0-49.9, adult [facility])    Osteoarthritis    Past Surgical History:  Procedure Laterality Date   CATARACT EXTRACTION W/PHACO Left 05/12/2016   Procedure: CATARACT EXTRACTION PHACO AND INTRAOCULAR LENS PLACEMENT (IOC);  Surgeon: Cherene Mania, MD;  Location: [facility];  Service: Ophthalmology;  Laterality: Left;  CDE: 6.63   CATARACT EXTRACTION W/PHACO Right 06/16/2016   Procedure: CATARACT EXTRACTION PHACO AND INTRAOCULAR LENS PLACEMENT (IOC); CDE:  4.13;  Surgeon: Cherene Mania, MD;  Location: [facility];  Service: Ophthalmology;  Laterality: Right;   COLONOSCOPY  07/12/2012   Procedure: COLONOSCOPY;  Surgeon: Margo LITTIE Haddock, MD;  Location: [facility];  Service: Endoscopy;  Laterality: N/A;  1:00   COLONOSCOPY N/A 03/21/2024   Procedure: COLONOSCOPY;  Surgeon: Cindie Carlin POUR, DO;  Location: [facility];  Service: Endoscopy;  Laterality: N/A;   1030AM, ASA 3   lipoma-right shoulder     spur and nerve repair right shoulder     Patient Active Problem List   Diagnosis Date Noted   Snoring 03/11/2024   Hyperkalemia 09/17/2022   Hypernatremia 09/17/2022   Localized edema 07/23/2022   Paresthesia of hand 07/23/2022   Congestive heart failure (HCC) 07/12/2022   Chronic obstructive lung disease (HCC) 01/27/2022   Morbid obesity (HCC) 01/27/2022   Edema of lower extremity 09/30/2021   Lupus erythematosus 09/25/2021   Chronic insomnia 08/21/2021   Chronic kidney disease 06/27/2021   Thrombocytopenic disorder 06/27/2021   Gastroesophageal reflux disease 05/24/2021   Hyperlipidemia 05/24/2021   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   Leg weakness 04/14/2013   Back pain 04/14/2013   Knee pain 04/14/2013   Pain in joint, shoulder region 03/11/2013   Muscle weakness (generalized) 03/11/2013   Pain in joint, ankle and foot 03/11/2013   Abnormality of gait 03/11/2013   Right ankle sprain 03/02/2013   Shoulder contusion 03/02/2013   Constipation 06/22/2012   Colon cancer screening 06/22/2012   Pain in limb 07/05/2009   FLANK PAIN, RIGHT 04/04/2009   ELECTROCARDIOGRAM, ABNORMAL 02/20/2009   DIABETES MELLITUS, WITH NEUROLOGICAL COMPLICATIONS 01/09/2009   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) 01/09/2009   Essential hypertension 10/13/2008   Osteoarthritis 10/13/2008   LOW BACK PAIN 10/13/2008    PCP: Joeann Browning, FNP  REFERRING PROVIDER: Joeann Browning, FNP  REFERRING DIAG: M54.50 (ICD-10-CM) - Low back pain, unspecified  Rationale for Evaluation and Treatment: Rehabilitation  THERAPY DIAG:  Low back pain, unspecified back pain laterality, unspecified chronicity, unspecified whether sciatica present  Other symptoms and signs involving the musculoskeletal system  Difficulty in walking, not elsewhere classified  ONSET DATE:  worse starting in August of this year  SUBJECTIVE:                                                                                                                                                                                           SUBJECTIVE STATEMENT: Reports her left knee is bothering her today; thinks it is likely due to the cold weather.  Eval:  Has low back pain that got worse back in August; went to ED; after kept hurting went back to the MD and told her about it; she referred to physical therapy; arrives today with [facility]  PERTINENT HISTORY:  Fall with left leg fracture several years ago Anxiety COPD DM HTN CKD Neuropathy? States her feet are numb but unable to say if neuropathy Gout Left knee gives way  PAIN:  Are you having pain? Yes: NPRS scale: 8/10; 10/10 at worst; 4/10 at best Pain location: low back right side Pain description: aching Aggravating factors: laying in certain positions; hard to bend over Relieving factors: muscle cream Aspercreme; Tylenol   PRECAUTIONS: Fall  RED FLAGS: None   WEIGHT BEARING RESTRICTIONS: No  FALLS:  Has patient fallen in last 6 months? No   OCCUPATION: retired  PLOF: Independent with household mobility with device and Independent with community mobility with device  PATIENT GOALS: get better so I can walk better  NEXT MD VISIT: 3rd month  OBJECTIVE:  Note: Objective measures were completed at Evaluation unless otherwise noted.  DIAGNOSTIC FINDINGS:  CLINICAL DATA:  Low back pain   EXAM: LUMBAR SPINE - 2-3 VIEW   COMPARISON:  11/13/2022   FINDINGS: Diffuse degenerative facet disease, most pronounced in the lower lumbar spine. 6 mm anterolisthesis of L4 on L5, stable. Disc spaces are maintained. No fracture. SI joints symmetric. Calcified fibroid centrally in the pelvis.   IMPRESSION: Degenerative facet disease with stable slight anterolisthesis at L4-5.   No acute bony abnormality.      Electronically Signed   By: Franky Crease M.D.   On: 06/22/2024 00:19  PATIENT SURVEYS:  Modified Oswestry:  MODIFIED OSWESTRY DISABILITY SCALE  Date:  10/20/2024 Score  Total 29/50; 58%   Interpretation of scores: Score Category Description  0-20% Minimal Disability The patient can cope with most living activities. Usually no treatment is indicated apart from advice on lifting, sitting and exercise  21-40% Moderate Disability The patient experiences more pain and difficulty with sitting, lifting and standing. Travel and social life are more difficult and they may be disabled from work. Personal care, sexual activity and sleeping are not grossly affected, and the patient can usually be managed by conservative means  41-60% Severe Disability Pain remains the main problem in this group, but activities of daily living are affected. These patients require a detailed investigation  61-80% Crippled Back pain impinges on all aspects of the patients life. Positive intervention is required  81-100% Bed-bound These patients are either bed-bound or exaggerating their symptoms  Bluford FORBES Zoe DELENA Karon DELENA, et al. Surgery versus conservative management of stable thoracolumbar fracture: the PRESTO feasibility RCT. [place] ([place]): [employer]; 2021 Nov. [facility] Technology Assessment, No. 25.62.) Appendix 3, Oswestry Disability Index category descriptors. Available from: Findjewelers.cz  Minimally Clinically Important Difference (MCID) = 12.8%  COGNITION: Overall cognitive status: Within functional limits for tasks assessed     SENSATION: WFL;  legs are swollen lower portion  MUSCLE LENGTH: Hamstrings: next visit  POSTURE: increased lumbar lordosis and flexed trunk   PALPATION: Tender right side low back  LUMBAR ROM:   AROM eval  Flexion Slow; guarded down to fingertips 5 above ankle  Extension To neutral only  Right lateral flexion   Left  lateral flexion   Right rotation   Left rotation    (Blank rows = not tested)  LOWER EXTREMITY ROM:     Active  Right eval Left eval  Hip flexion    Hip extension    Hip abduction    Hip adduction    Hip internal rotation    Hip external rotation    Knee flexion    Knee extension    Ankle dorsiflexion    Ankle plantarflexion    Ankle inversion    Ankle eversion     (Blank rows = not tested)  LOWER EXTREMITY MMT:    MMT Right eval Left eval  Hip flexion 4 4  Hip extension    Hip abduction    Hip adduction    Hip internal rotation    Hip external rotation    Knee flexion    Knee extension 4+ 4+  Ankle dorsiflexion 4+ 4+ (sore big toe from dropping phone on her toe)  Ankle plantarflexion    Ankle inversion    Ankle eversion     (Blank rows = not tested)    FUNCTIONAL TESTS:  5 times sit to stand: 31.39 sec using hands to push up to standing 2 minute walk test: 94 ft  GAIT: Distance walked: 50 ft in clinic Assistive device utilized: Single point cane Level of assistance: Modified independence Comments: slow antalgic gait  TREATMENT DATE:  11/18/24 Nustep seat 8 x 5' dynamic warm up Seated (4 box under feet) Heel/toe raises x 20 each Hip adduction 5 hold 2 x 10 Hip abduction with red theraband 2 x 10 Hip flexion and external rotation to tap yoga block x 5 each Standing: (with bilateral upper extremity assist) Heel raises 2 x 10 Hip extension 2 x 10 Hip abduction 2 x 10 4 box step ups x 10 each   11/16/24 Seated: (4 box under feet) Toe raises x 20 Heel  raises x 20 Abdominal bracing with hip adduction with ball 5 x 10 Abdominal bracing with hip abduction with  red theraband 2 x 10 Hip flexion with red theraband 2 x 10 each LAQ's 1# 2 x 10 each Sit to stand 2 x 5 Standing: Heel raises x 10 4 step ups x 5 each with bilateral UE assist   11/10/24: Seated: LAQ, 2x10 each LE March, 2x10 each LE Sit to stands 10X no UE Supine with  elevation  Bridge 2X10  TrA isometric 10X5  TrA with SLR 2X10 each LE  TrA with hip add ball squeeze 5 holds 2X10  TrA with hip abduction GTB resistance around knees 2X10 Standing:  at counter with bil UE assist  Heel raises 20X  Hip abduction 10X each   11/07/24: STS, 12, from 21.5 inch mat height Seated: LAQ, 2x10 each LE March, 2x10 each LE Supine:  Bridge, 2x10 TrA activation paired with cueing for breathing 5x 5 TA contract + SLR, 10x each LE TA contract + Hip add/Abd, 5 holds, 10 of each, yellow ball and RTB around knees Standing heel raises at counter, 2x10 Standing mini squats at counter, 10x, verbal cues for form    11/01/24: Reviewed goals Educated importance of HEP compliance for maximal benefits Discussed benefits with compression garments for edema control Given ETI handout for thigh high compression garment STS  with UE on thigh 10x, eccentric control SLS Rt 1, Lt 1-2 max of 5 attempts Supine: TrA activation paired with cueing for breathing 10x 5 Decompression 2-5 5x 5 Bridge 10x 5 partial raise  10/20/2024 physical therapy evaluation and HEP instruction                                                                                                                                 PATIENT EDUCATION:  Education details: Patient educated on exam findings, POC, scope of PT, HEP, and what to expect next visit. Person educated: Patient Education method: Explanation, Demonstration, and Handouts Education comprehension: verbalized understanding, returned demonstration, verbal cues required, and tactile cues required   HOME EXERCISE PROGRAM: Access Code: [email or web address] URL: [email or web address] Date: 10/20/2024 Prepared by: [facility] - Rehab  Exercises - Sit to Stand with Armchair  - 2 x daily - 7 x weekly - 1 sets - 5 reps - Seated Transversus Abdominis Bracing  - 2 x daily - 7 x weekly - 1 sets - 10 reps - 5 sec hold  11/01/24: -Decompression  exercises ETI handout for thigh high compression garments  ASSESSMENT:  CLINICAL IMPRESSION: Patient continues to walk with [facility] with antalgic gait and decreased gait speed.  Started session with Nustep today and patient able to complete 5 minutes without any increased knee pain.   Added standing hip exercises today.  Patient needed cues to avoid trunk substitution. Needed 2 seated rest breaks during treatment today but no complaint of increased pain.   Patient will benefit from continued skilled  therapy services to address deficits and promote return to optimal function.        Eval:  Patient is a 74 y.o. female who was seen today for physical therapy evaluation and treatment for M54.50 (ICD-10-CM) - Low back pain, unspecified.  Patient demonstrates muscle weakness, reduced ROM, and fascial restrictions which are likely contributing to symptoms of pain and are negatively impacting patient ability to perform ADLs and functional mobility tasks. Patient will benefit from skilled physical therapy services to address these deficits to reduce pain and improve level of function with ADLs and functional mobility tasks.   OBJECTIVE IMPAIRMENTS: Abnormal gait, decreased activity tolerance, decreased ROM, decreased strength, and pain.   ACTIVITY LIMITATIONS: carrying, lifting, bending, sitting, standing, squatting, sleeping, stairs, bathing, and locomotion level  PARTICIPATION LIMITATIONS: meal prep, cleaning, laundry, driving, shopping, community activity, and church  PERSONAL FACTORS: 3+ comorbidities: DM, HTN, CKD are also affecting patient's functional outcome.   REHAB POTENTIAL: Good  CLINICAL DECISION MAKING: Evolving/moderate complexity  EVALUATION COMPLEXITY: Moderate   GOALS: Goals reviewed with patient? No  SHORT TERM GOALS: Target date: 11/11/2023  patient will be independent with initial HEP and compliant with HEP 3-4 times a week   Baseline: Goal status: INITIAL  2.  Patient  will report 50% improvement overall   Baseline:  Goal status: INITIAL    LONG TERM GOALS: Target date: 12/01/2024  Patient will be independent in self management strategies to improve quality of life and functional outcomes.  Baseline:  Goal status: INITIAL  2.  Patient will report 70% improvement overall   Baseline:  Goal status: INITIAL  3.  Patient will improve 5 times sit to stand score to 20 sec or less to demonstrate improved functional mobility and increased leg strength.    Baseline: 31.39 sec Goal status: INITIAL  4.  Patient will increase distance on 2 MWT to 150 ft or more with LRAD to demonstrate improved speed and efficiency with household and community ambulation  Baseline: 94 ft with SPC Goal status: INITIAL  PLAN:  PT FREQUENCY: 2x/week  PT DURATION: 6 weeks  PLANNED INTERVENTIONS: [id number]- PT Re-evaluation, 97110-Therapeutic exercises, 97530- Therapeutic activity, 97112- Neuromuscular re-education, 97535- Self Care, [id number]- Manual therapy, [id number]- Gait training, [phone]- Orthotic Fit/training, [phone]- Canalith repositioning, [id number]- Aquatic Therapy, 97760- Splinting, 97597- Wound care (first 20 sq cm), 97598- Wound care (each additional 20 sq cm)Patient/Family education, Balance training, Stair training, Taping, Dry Needling, Joint mobilization, Joint manipulation, Spinal manipulation, Spinal mobilization, Scar mobilization, and DME instructions. SABRA  PLAN FOR NEXT SESSION:  core and lower extremity strengthening; postural strengthening.  Progress standing exercises.  2:55 PM, 11/18/24 Baillie Mohammad Small Homer Miller MPT [facility] [place] [phone] Ph:[phone]  "

## 2024-11-22 ENCOUNTER — Ambulatory Visit (HOSPITAL_COMMUNITY)

## 2024-11-22 DIAGNOSIS — R29898 Other symptoms and signs involving the musculoskeletal system: Secondary | ICD-10-CM

## 2024-11-22 DIAGNOSIS — M545 Low back pain, unspecified: Secondary | ICD-10-CM

## 2024-11-22 DIAGNOSIS — R262 Difficulty in walking, not elsewhere classified: Secondary | ICD-10-CM

## 2024-11-22 NOTE — Therapy (Signed)
 " OUTPATIENT PHYSICAL THERAPY THORACOLUMBAR TREATMENT   Patient Name: Allison Gould MRN: [id number] DOB:Oct 23, 1951, 74 y.o., female Today's Date: 11/22/2024  END OF SESSION:  PT End of Session - 11/22/24 1418     Visit Number 7    Number of Visits 12    Date for Recertification  12/01/24    Authorization Type [employer] dual complete    Authorization Time Period no auth needed    Progress Note Due on Visit 10    PT Start Time 0217    PT Stop Time 0257    PT Time Calculation (min) 40 min    Activity Tolerance Patient tolerated treatment well    Behavior During Therapy WFL for tasks assessed/performed           Past Medical History:  Diagnosis Date   Anxiety    CKD (chronic kidney disease)    COPD (chronic obstructive pulmonary disease) (HCC)    Diabetes mellitus type II    GERD (gastroesophageal reflux disease)    Gout    Hearing aid worn    right   Hyperparathyroidism    Hypertension    Lipid disorder    Loss of hair    for unknown reason, to see MD    Low back pain    Lupus (systemic lupus erythematosus) (HCC)    Morbid obesity with BMI of 45.0-49.9, adult [facility])    Osteoarthritis    Past Surgical History:  Procedure Laterality Date   CATARACT EXTRACTION W/PHACO Left 05/12/2016   Procedure: CATARACT EXTRACTION PHACO AND INTRAOCULAR LENS PLACEMENT (IOC);  Surgeon: Cherene Mania, MD;  Location: [facility];  Service: Ophthalmology;  Laterality: Left;  CDE: 6.63   CATARACT EXTRACTION W/PHACO Right 06/16/2016   Procedure: CATARACT EXTRACTION PHACO AND INTRAOCULAR LENS PLACEMENT (IOC); CDE:  4.13;  Surgeon: Cherene Mania, MD;  Location: [facility];  Service: Ophthalmology;  Laterality: Right;   COLONOSCOPY  07/12/2012   Procedure: COLONOSCOPY;  Surgeon: Margo LITTIE Haddock, MD;  Location: [facility];  Service: Endoscopy;  Laterality: N/A;  1:00   COLONOSCOPY N/A 03/21/2024   Procedure: COLONOSCOPY;  Surgeon: Cindie Carlin POUR, DO;  Location: [facility];  Service: Endoscopy;  Laterality: N/A;   1030AM, ASA 3   lipoma-right shoulder     spur and nerve repair right shoulder     Patient Active Problem List   Diagnosis Date Noted   Snoring 03/11/2024   Hyperkalemia 09/17/2022   Hypernatremia 09/17/2022   Localized edema 07/23/2022   Paresthesia of hand 07/23/2022   Congestive heart failure (HCC) 07/12/2022   Chronic obstructive lung disease (HCC) 01/27/2022   Morbid obesity (HCC) 01/27/2022   Edema of lower extremity 09/30/2021   Lupus erythematosus 09/25/2021   Chronic insomnia 08/21/2021   Chronic kidney disease 06/27/2021   Thrombocytopenic disorder 06/27/2021   Gastroesophageal reflux disease 05/24/2021   Hyperlipidemia 05/24/2021   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   Leg weakness 04/14/2013   Back pain 04/14/2013   Knee pain 04/14/2013   Pain in joint, shoulder region 03/11/2013   Muscle weakness (generalized) 03/11/2013   Pain in joint, ankle and foot 03/11/2013   Abnormality of gait 03/11/2013   Right ankle sprain 03/02/2013   Shoulder contusion 03/02/2013   Constipation 06/22/2012   Colon cancer screening 06/22/2012   Pain in limb 07/05/2009   FLANK PAIN, RIGHT 04/04/2009   ELECTROCARDIOGRAM, ABNORMAL 02/20/2009   DIABETES MELLITUS, WITH NEUROLOGICAL COMPLICATIONS 01/09/2009   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) 01/09/2009   Essential hypertension 10/13/2008   Osteoarthritis 10/13/2008   LOW BACK PAIN 10/13/2008    PCP: Joeann Browning, FNP  REFERRING PROVIDER: Joeann Browning, FNP  REFERRING DIAG: M54.50 (ICD-10-CM) - Low back pain, unspecified  Rationale for Evaluation and Treatment: Rehabilitation  THERAPY DIAG:  Low back pain, unspecified back pain laterality, unspecified chronicity, unspecified whether sciatica present  Other symptoms and signs involving the musculoskeletal system  Difficulty in walking, not elsewhere classified  ONSET DATE:  worse starting in August of this year  SUBJECTIVE:                                                                                                                                                                                           SUBJECTIVE STATEMENT: Left knee still bothering her; sore; 8/10; her back pain is about a 5/10  Eval:  Has low back pain that got worse back in August; went to ED; after kept hurting went back to the MD and told her about it; she referred to physical therapy; arrives today with [facility]  PERTINENT HISTORY:  Fall with left leg fracture several years ago Anxiety COPD DM HTN CKD Neuropathy? States her feet are numb but unable to say if neuropathy Gout Left knee gives way  PAIN:  Are you having pain? Yes: NPRS scale: 8/10; 10/10 at worst; 4/10 at best Pain location: low back right side Pain description: aching Aggravating factors: laying in certain positions; hard to bend over Relieving factors: muscle cream Aspercreme; Tylenol   PRECAUTIONS: Fall  RED FLAGS: None   WEIGHT BEARING RESTRICTIONS: No  FALLS:  Has patient fallen in last 6 months? No   OCCUPATION: retired  PLOF: Independent with household mobility with device and Independent with community mobility with device  PATIENT GOALS: get better so I can walk better  NEXT MD VISIT: 3rd month  OBJECTIVE:  Note: Objective measures were completed at Evaluation unless otherwise noted.  DIAGNOSTIC FINDINGS:  CLINICAL DATA:  Low back pain   EXAM: LUMBAR SPINE - 2-3 VIEW   COMPARISON:  11/13/2022   FINDINGS: Diffuse degenerative facet disease, most pronounced in the lower lumbar spine. 6 mm anterolisthesis of L4 on L5, stable. Disc spaces are maintained. No fracture. SI joints symmetric. Calcified fibroid centrally in the pelvis.   IMPRESSION: Degenerative facet disease with stable slight anterolisthesis at L4-5.   No acute bony abnormality.     Electronically Signed   By:  Franky Crease M.D.   On: 06/22/2024 00:19  PATIENT SURVEYS:  Modified Oswestry:  MODIFIED OSWESTRY DISABILITY SCALE  Date: 10/20/2024 Score  Total 29/50; 58%   Interpretation of scores: Score Category Description  0-20% Minimal Disability The patient can cope with most living activities. Usually no treatment is indicated apart from advice on lifting, sitting and exercise  21-40% Moderate Disability The patient experiences more pain and difficulty with sitting, lifting and standing. Travel and social life are more difficult and they may be disabled from work. Personal care, sexual activity and sleeping are not grossly affected, and the patient can usually be managed by conservative means  41-60% Severe Disability Pain remains the main problem in this group, but activities of daily living are affected. These patients require a detailed investigation  61-80% Crippled Back pain impinges on all aspects of the patients life. Positive intervention is required  81-100% Bed-bound These patients are either bed-bound or exaggerating their symptoms  Bluford FORBES Zoe DELENA Karon DELENA, et al. Surgery versus conservative management of stable thoracolumbar fracture: the PRESTO feasibility RCT. [place] ([place]): [employer]; 2021 Nov. [facility] Technology Assessment, No. 25.62.) Appendix 3, Oswestry Disability Index category descriptors. Available from: Findjewelers.cz  Minimally Clinically Important Difference (MCID) = 12.8%  COGNITION: Overall cognitive status: Within functional limits for tasks assessed     SENSATION: WFL;  legs are swollen lower portion  MUSCLE LENGTH: Hamstrings: next visit  POSTURE: increased lumbar lordosis and flexed trunk   PALPATION: Tender right side low back  LUMBAR ROM:   AROM eval  Flexion Slow; guarded down to fingertips 5 above ankle  Extension To neutral only  Right lateral flexion   Left lateral flexion   Right rotation    Left rotation    (Blank rows = not tested)  LOWER EXTREMITY ROM:     Active  Right eval Left eval  Hip flexion    Hip extension    Hip abduction    Hip adduction    Hip internal rotation    Hip external rotation    Knee flexion    Knee extension    Ankle dorsiflexion    Ankle plantarflexion    Ankle inversion    Ankle eversion     (Blank rows = not tested)  LOWER EXTREMITY MMT:    MMT Right eval Left eval  Hip flexion 4 4  Hip extension    Hip abduction    Hip adduction    Hip internal rotation    Hip external rotation    Knee flexion    Knee extension 4+ 4+  Ankle dorsiflexion 4+ 4+ (sore big toe from dropping phone on her toe)  Ankle plantarflexion    Ankle inversion    Ankle eversion     (Blank rows = not tested)    FUNCTIONAL TESTS:  5 times sit to stand: 31.39 sec using hands to push up to standing 2 minute walk test: 94 ft  GAIT: Distance walked: 50 ft in clinic Assistive device utilized: Single point cane Level of assistance: Modified independence Comments: slow antalgic gait  TREATMENT DATE:  11/22/24 Nustep seat 8 x 5' dynamic warm up level 2 Seated (4 box under feet) Hip adduction with ball 5 hold 2 x 10 LAQ's 2# weight 2 x 10 Hip flexion 2# weight 2 x 10 Standing: Heel raises 2 x 10 Hip extension 2 x 10 each 7 step toe taps    11/18/24 Nustep seat 8 x 5' dynamic warm up Seated (4 box under feet) Heel/toe raises x 20 each Hip adduction 5 hold 2 x 10 Hip abduction with red theraband 2 x  10 Hip flexion and external rotation to tap yoga block x 5 each Standing: (with bilateral upper extremity assist) Heel raises 2 x 10 Hip extension 2 x 10 Hip abduction 2 x 10 4 box step ups x 10 each   11/16/24 Seated: (4 box under feet) Toe raises x 20 Heel raises x 20 Abdominal bracing with hip adduction with ball 5 x 10 Abdominal bracing with hip abduction with  red theraband 2 x 10 Hip flexion with red theraband 2 x 10  each LAQ's 1# 2 x 10 each Sit to stand 2 x 5 Standing: Heel raises x 10 4 step ups x 5 each with bilateral UE assist   11/10/24: Seated: LAQ, 2x10 each LE March, 2x10 each LE Sit to stands 10X no UE Supine with elevation  Bridge 2X10  TrA isometric 10X5  TrA with SLR 2X10 each LE  TrA with hip add ball squeeze 5 holds 2X10  TrA with hip abduction GTB resistance around knees 2X10 Standing:  at counter with bil UE assist  Heel raises 20X  Hip abduction 10X each   11/07/24: STS, 12, from 21.5 inch mat height Seated: LAQ, 2x10 each LE March, 2x10 each LE Supine:  Bridge, 2x10 TrA activation paired with cueing for breathing 5x 5 TA contract + SLR, 10x each LE TA contract + Hip add/Abd, 5 holds, 10 of each, yellow ball and RTB around knees Standing heel raises at counter, 2x10 Standing mini squats at counter, 10x, verbal cues for form    11/01/24: Reviewed goals Educated importance of HEP compliance for maximal benefits Discussed benefits with compression garments for edema control Given ETI handout for thigh high compression garment STS  with UE on thigh 10x, eccentric control SLS Rt 1, Lt 1-2 max of 5 attempts Supine: TrA activation paired with cueing for breathing 10x 5 Decompression 2-5 5x 5 Bridge 10x 5 partial raise  10/20/2024 physical therapy evaluation and HEP instruction                                                                                                                                 PATIENT EDUCATION:  Education details: Patient educated on exam findings, POC, scope of PT, HEP, and what to expect next visit. Person educated: Patient Education method: Explanation, Demonstration, and Handouts Education comprehension: verbalized understanding, returned demonstration, verbal cues required, and tactile cues required   HOME EXERCISE PROGRAM: Access Code: [email or web address] URL: [email or web address] Date: 10/20/2024 Prepared by: [facility]  - Rehab  Exercises - Sit to Stand with Armchair  - 2 x daily - 7 x weekly - 1 sets - 5 reps - Seated Transversus Abdominis Bracing  - 2 x daily - 7 x weekly - 1 sets - 10 reps - 5 sec hold  11/01/24: -Decompression exercises ETI handout for thigh high compression garments  ASSESSMENT:  CLINICAL IMPRESSION: Patient continues to walk with [facility] with antalgic gait and decreased gait speed. Reports  she has some increased pain after prolonged sitting.  Started session with Nustep today;  Added weight to sitting exercise today without issue.   Does seem to have increased knee pain left knee with weightbearing activity.  Added toe taps today for balance.  Fatigued after treatment today; appropriate amount.  Patient will benefit from continued skilled therapy services to address deficits and promote return to optimal function.        Eval:  Patient is a 74 y.o. female who was seen today for physical therapy evaluation and treatment for M54.50 (ICD-10-CM) - Low back pain, unspecified.  Patient demonstrates muscle weakness, reduced ROM, and fascial restrictions which are likely contributing to symptoms of pain and are negatively impacting patient ability to perform ADLs and functional mobility tasks. Patient will benefit from skilled physical therapy services to address these deficits to reduce pain and improve level of function with ADLs and functional mobility tasks.   OBJECTIVE IMPAIRMENTS: Abnormal gait, decreased activity tolerance, decreased ROM, decreased strength, and pain.   ACTIVITY LIMITATIONS: carrying, lifting, bending, sitting, standing, squatting, sleeping, stairs, bathing, and locomotion level  PARTICIPATION LIMITATIONS: meal prep, cleaning, laundry, driving, shopping, community activity, and church  PERSONAL FACTORS: 3+ comorbidities: DM, HTN, CKD are also affecting patient's functional outcome.   REHAB POTENTIAL: Good  CLINICAL DECISION MAKING: Evolving/moderate  complexity  EVALUATION COMPLEXITY: Moderate   GOALS: Goals reviewed with patient? No  SHORT TERM GOALS: Target date: 11/11/2023  patient will be independent with initial HEP and compliant with HEP 3-4 times a week   Baseline: Goal status: INITIAL  2.  Patient will report 50% improvement overall   Baseline:  Goal status: INITIAL    LONG TERM GOALS: Target date: 12/01/2024  Patient will be independent in self management strategies to improve quality of life and functional outcomes.  Baseline:  Goal status: INITIAL  2.  Patient will report 70% improvement overall   Baseline:  Goal status: INITIAL  3.  Patient will improve 5 times sit to stand score to 20 sec or less to demonstrate improved functional mobility and increased leg strength.    Baseline: 31.39 sec Goal status: INITIAL  4.  Patient will increase distance on 2 MWT to 150 ft or more with LRAD to demonstrate improved speed and efficiency with household and community ambulation  Baseline: 94 ft with SPC Goal status: INITIAL  PLAN:  PT FREQUENCY: 2x/week  PT DURATION: 6 weeks  PLANNED INTERVENTIONS: [id number]- PT Re-evaluation, 97110-Therapeutic exercises, 97530- Therapeutic activity, 97112- Neuromuscular re-education, 97535- Self Care, [id number]- Manual therapy, [id number]- Gait training, [phone]- Orthotic Fit/training, [phone]- Canalith repositioning, [id number]- Aquatic Therapy, 97760- Splinting, 97597- Wound care (first 20 sq cm), 97598- Wound care (each additional 20 sq cm)Patient/Family education, Balance training, Stair training, Taping, Dry Needling, Joint mobilization, Joint manipulation, Spinal manipulation, Spinal mobilization, Scar mobilization, and DME instructions. SABRA  PLAN FOR NEXT SESSION:  core and lower extremity strengthening; postural strengthening.  Progress standing exercises.  2:58 PM, 11/22/24 Olukemi Panchal Small Domino Holten MPT [place] [facility] [place] [phone] [phone]  "

## 2024-11-24 ENCOUNTER — Ambulatory Visit (HOSPITAL_COMMUNITY)

## 2024-11-24 ENCOUNTER — Encounter (HOSPITAL_COMMUNITY): Payer: Self-pay

## 2024-11-24 DIAGNOSIS — R262 Difficulty in walking, not elsewhere classified: Secondary | ICD-10-CM

## 2024-11-24 DIAGNOSIS — M545 Low back pain, unspecified: Secondary | ICD-10-CM

## 2024-11-24 DIAGNOSIS — R29898 Other symptoms and signs involving the musculoskeletal system: Secondary | ICD-10-CM

## 2024-11-24 NOTE — Therapy (Signed)
 " OUTPATIENT PHYSICAL THERAPY THORACOLUMBAR TREATMENT   Patient Name: Allison Gould MRN: [id number] DOB:20-May-1951, 74 y.o., female Today's Date: 11/24/2024  END OF SESSION:  PT End of Session - 11/24/24 1418     Visit Number 8    Number of Visits 12    Date for Recertification  12/01/24    Authorization Type [employer] dual complete    Authorization Time Period no auth needed    Progress Note Due on Visit 10    PT Start Time 1418    PT Stop Time 1457    PT Time Calculation (min) 39 min    Activity Tolerance Patient tolerated treatment well    Behavior During Therapy WFL for tasks assessed/performed           Past Medical History:  Diagnosis Date   Anxiety    CKD (chronic kidney disease)    COPD (chronic obstructive pulmonary disease) (HCC)    Diabetes mellitus type II    GERD (gastroesophageal reflux disease)    Gout    Hearing aid worn    right   Hyperparathyroidism    Hypertension    Lipid disorder    Loss of hair    for unknown reason, to see MD    Low back pain    Lupus (systemic lupus erythematosus) (HCC)    Morbid obesity with BMI of 45.0-49.9, adult [facility])    Osteoarthritis    Past Surgical History:  Procedure Laterality Date   CATARACT EXTRACTION W/PHACO Left 05/12/2016   Procedure: CATARACT EXTRACTION PHACO AND INTRAOCULAR LENS PLACEMENT (IOC);  Surgeon: Cherene Mania, MD;  Location: [facility] OR[facility];  Service: Ophthalmology;  Laterality: Left;  CDE: 6.63   CATARACT EXTRACTION W/PHACO Right 06/16/2016   Procedure: CATARACT EXTRACTION PHACO AND INTRAOCULAR LENS PLACEMENT (IOC); CDE:  4.13;  Surgeon: Cherene Mania, MD;  Location: [facility];  Service: Ophthalmology;  Laterality: Right;   COLONOSCOPY  07/12/2012   Procedure: COLONOSCOPY;  Surgeon: Margo LITTIE Haddock, MD;  Location: [facility];  Service: Endoscopy;  Laterality: N/A;  1:00   COLONOSCOPY N/A 03/21/2024   Procedure: COLONOSCOPY;  Surgeon: Cindie Carlin POUR, DO;  Location: [facility];  Service: Endoscopy;  Laterality: N/A;   1030AM, ASA 3   lipoma-right shoulder     spur and nerve repair right shoulder     Patient Active Problem List   Diagnosis Date Noted   Snoring 03/11/2024   Hyperkalemia 09/17/2022   Hypernatremia 09/17/2022   Localized edema 07/23/2022   Paresthesia of hand 07/23/2022   Congestive heart failure (HCC) 07/12/2022   Chronic obstructive lung disease (HCC) 01/27/2022   Morbid obesity (HCC) 01/27/2022   Edema of lower extremity 09/30/2021   Lupus erythematosus 09/25/2021   Chronic insomnia 08/21/2021   Chronic kidney disease 06/27/2021   Thrombocytopenic disorder 06/27/2021   Gastroesophageal reflux disease 05/24/2021   Hyperlipidemia 05/24/2021   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   Leg weakness 04/14/2013   Back pain 04/14/2013   Knee pain 04/14/2013   Pain in joint, shoulder region 03/11/2013   Muscle weakness (generalized) 03/11/2013   Pain in joint, ankle and foot 03/11/2013   Abnormality of gait 03/11/2013   Right ankle sprain 03/02/2013   Shoulder contusion 03/02/2013   Constipation 06/22/2012   Colon cancer screening 06/22/2012   Pain in limb 07/05/2009   FLANK PAIN, RIGHT 04/04/2009   ELECTROCARDIOGRAM, ABNORMAL 02/20/2009   DIABETES MELLITUS, WITH NEUROLOGICAL COMPLICATIONS 01/09/2009   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) 01/09/2009   Essential hypertension 10/13/2008   Osteoarthritis 10/13/2008   LOW BACK PAIN 10/13/2008    PCP: Joeann Browning, FNP  REFERRING PROVIDER: Joeann Browning, FNP  REFERRING DIAG: M54.50 (ICD-10-CM) - Low back pain, unspecified  Rationale for Evaluation and Treatment: Rehabilitation  THERAPY DIAG:  Low back pain, unspecified back pain laterality, unspecified chronicity, unspecified whether sciatica present  Other symptoms and signs involving the musculoskeletal system  Difficulty in walking, not elsewhere classified  ONSET DATE:  worse starting in August of this year  SUBJECTIVE:                                                                                                                                                                                           SUBJECTIVE STATEMENT: Pt reports her knee is bothering her most today, 10/10. Reports being on it makes it worse. Reports having some ow back pain today as well but knee is the worst.   Eval:  Has low back pain that got worse back in August; went to ED; after kept hurting went back to the MD and told her about it; she referred to physical therapy; arrives today with [facility]  PERTINENT HISTORY:  Fall with left leg fracture several years ago Anxiety COPD DM HTN CKD Neuropathy? States her feet are numb but unable to say if neuropathy Gout Left knee gives way  PAIN:  Are you having pain? Yes: NPRS scale: 8/10; 10/10 at worst; 4/10 at best Pain location: low back right side Pain description: aching Aggravating factors: laying in certain positions; hard to bend over Relieving factors: muscle cream Aspercreme; Tylenol   PRECAUTIONS: Fall  RED FLAGS: None   WEIGHT BEARING RESTRICTIONS: No  FALLS:  Has patient fallen in last 6 months? No   OCCUPATION: retired  PLOF: Independent with household mobility with device and Independent with community mobility with device  PATIENT GOALS: get better so I can walk better  NEXT MD VISIT: 3rd month  OBJECTIVE:  Note: Objective measures were completed at Evaluation unless otherwise noted.  DIAGNOSTIC FINDINGS:  CLINICAL DATA:  Low back pain   EXAM: LUMBAR SPINE - 2-3 VIEW   COMPARISON:  11/13/2022   FINDINGS: Diffuse degenerative facet disease, most pronounced in the lower lumbar spine. 6 mm anterolisthesis of L4 on L5, stable. Disc spaces are maintained. No fracture. SI joints symmetric. Calcified fibroid centrally in the pelvis.   IMPRESSION: Degenerative facet disease with stable slight  anterolisthesis at L4-5.   No acute bony abnormality.     Electronically Signed   By: Franky Crease M.D.   On:  06/22/2024 00:19  PATIENT SURVEYS:  Modified Oswestry:  MODIFIED OSWESTRY DISABILITY SCALE  Date: 10/20/2024 Score  Total 29/50; 58%   Interpretation of scores: Score Category Description  0-20% Minimal Disability The patient can cope with most living activities. Usually no treatment is indicated apart from advice on lifting, sitting and exercise  21-40% Moderate Disability The patient experiences more pain and difficulty with sitting, lifting and standing. Travel and social life are more difficult and they may be disabled from work. Personal care, sexual activity and sleeping are not grossly affected, and the patient can usually be managed by conservative means  41-60% Severe Disability Pain remains the main problem in this group, but activities of daily living are affected. These patients require a detailed investigation  61-80% Crippled Back pain impinges on all aspects of the patients life. Positive intervention is required  81-100% Bed-bound These patients are either bed-bound or exaggerating their symptoms  Bluford FORBES Zoe DELENA Karon DELENA, et al. Surgery versus conservative management of stable thoracolumbar fracture: the PRESTO feasibility RCT. [place] ([place]): [employer]; 2021 Nov. [facility] [employer] Assessment, No. 25.62.) Appendix 3, Oswestry Disability Index category descriptors. Available from: Findjewelers.cz  Minimally Clinically Important Difference (MCID) = 12.8%  COGNITION: Overall cognitive status: Within functional limits for tasks assessed     SENSATION: WFL;  legs are swollen lower portion  MUSCLE LENGTH: Hamstrings: next visit  POSTURE: increased lumbar lordosis and flexed trunk   PALPATION: Tender right side low back  LUMBAR ROM:   AROM eval  Flexion Slow; guarded down to fingertips 5 above ankle   Extension To neutral only  Right lateral flexion   Left lateral flexion   Right rotation   Left rotation    (Blank rows = not tested)  LOWER EXTREMITY ROM:     Active  Right eval Left eval  Hip flexion    Hip extension    Hip abduction    Hip adduction    Hip internal rotation    Hip external rotation    Knee flexion    Knee extension    Ankle dorsiflexion    Ankle plantarflexion    Ankle inversion    Ankle eversion     (Blank rows = not tested)  LOWER EXTREMITY MMT:    MMT Right eval Left eval  Hip flexion 4 4  Hip extension    Hip abduction    Hip adduction    Hip internal rotation    Hip external rotation    Knee flexion    Knee extension 4+ 4+  Ankle dorsiflexion 4+ 4+ (sore big toe from dropping phone on her toe)  Ankle plantarflexion    Ankle inversion    Ankle eversion     (Blank rows = not tested)    FUNCTIONAL TESTS:  5 times sit to stand: 31.39 sec using hands to push up to standing 2 minute walk test: 94 ft  GAIT: Distance walked: 50 ft in clinic Assistive device utilized: Single point cane Level of assistance: Modified independence Comments: slow antalgic gait  TREATMENT DATE:  11/24/24: NuStep, level 1, seat 10, 5' Forward foot taps, 8 inch step, 10x2 each Lateral step overs, 4 inch step, 10x over and back Lateral foot taps, 4 inch step, 10x each LE Heel raises, 2x12 Standing shoulder rows, RTB, 2x10, verbal cues for form Shoulder extension, RTB, 2x10, verbal cues for form    11/22/24 Nustep seat 8 x 5' dynamic warm up level 2 Seated (4 box under  feet) Hip adduction with ball 5 hold 2 x 10 LAQ's 2# weight 2 x 10 Hip flexion 2# weight 2 x 10 Standing: Heel raises 2 x 10 Hip extension 2 x 10 each 7 step toe taps    11/18/24 Nustep seat 8 x 5' dynamic warm up Seated (4 box under feet) Heel/toe raises x 20 each Hip adduction 5 hold 2 x 10 Hip abduction with red theraband 2 x 10 Hip flexion and external rotation to tap  yoga block x 5 each Standing: (with bilateral upper extremity assist) Heel raises 2 x 10 Hip extension 2 x 10 Hip abduction 2 x 10 4 box step ups x 10 each    PATIENT EDUCATION:  Education details: Patient educated on exam findings, POC, scope of PT, HEP, and what to expect next visit. Person educated: Patient Education method: Explanation, Demonstration, and Handouts Education comprehension: verbalized understanding, returned demonstration, verbal cues required, and tactile cues required   HOME EXERCISE PROGRAM: Access Code: [email or web address] URL: [email or web address] Date: 10/20/2024 Prepared by: [facility] - [facility]  Exercises - Sit to Stand with Armchair  - 2 x daily - 7 x weekly - 1 sets - 5 reps - Seated Transversus Abdominis Bracing  - 2 x daily - 7 x weekly - 1 sets - 10 reps - 5 sec hold  11/01/24: -Decompression exercises ETI handout for thigh high compression garments  ASSESSMENT:  CLINICAL IMPRESSION: Patient arrives to session with reports of increased L knee pain that is most bothersome to her today. Began session on NuStep for general warm up of tissues. Pt tolerates well. Followed with LE strengthening exercises. Patient tolerates more standing exercises this date than previous session indicating improved endurance. Added in core and posterior chain strengthening this date. Verbal cues for form but pt tolerated well overall. A few seated rest breaks throughout session. Patient will benefit from continued skilled therapy services to address deficits and promote return to optimal function.     Eval:  Patient is a 74 y.o. female who was seen today for physical therapy evaluation and treatment for M54.50 (ICD-10-CM) - Low back pain, unspecified.  Patient demonstrates muscle weakness, reduced ROM, and fascial restrictions which are likely contributing to symptoms of pain and are negatively impacting patient ability to perform ADLs and functional mobility tasks. Patient  will benefit from skilled physical therapy services to address these deficits to reduce pain and improve level of function with ADLs and functional mobility tasks.   OBJECTIVE IMPAIRMENTS: Abnormal gait, decreased activity tolerance, decreased ROM, decreased strength, and pain.   ACTIVITY LIMITATIONS: carrying, lifting, bending, sitting, standing, squatting, sleeping, stairs, bathing, and locomotion level  PARTICIPATION LIMITATIONS: meal prep, cleaning, laundry, driving, shopping, community activity, and church  PERSONAL FACTORS: 3+ comorbidities: DM, HTN, CKD are also affecting patient's functional outcome.   REHAB POTENTIAL: Good  CLINICAL DECISION MAKING: Evolving/moderate complexity  EVALUATION COMPLEXITY: Moderate   GOALS: Goals reviewed with patient? No  SHORT TERM GOALS: Target date: 11/11/2023  patient will be independent with initial HEP and compliant with HEP 3-4 times a week   Baseline: Goal status: INITIAL  2.  Patient will report 50% improvement overall   Baseline:  Goal status: INITIAL    LONG TERM GOALS: Target date: 12/01/2024  Patient will be independent in self management strategies to improve quality of life and functional outcomes.  Baseline:  Goal status: INITIAL  2.  Patient will report 70% improvement overall   Baseline:  Goal status: INITIAL  3.  Patient  will improve 5 times sit to stand score to 20 sec or less to demonstrate improved functional mobility and increased leg strength.    Baseline: 31.39 sec Goal status: INITIAL  4.  Patient will increase distance on 2 MWT to 150 ft or more with LRAD to demonstrate improved speed and efficiency with household and community ambulation  Baseline: 94 ft with SPC Goal status: INITIAL  PLAN:  PT FREQUENCY: 2x/week  PT DURATION: 6 weeks  PLANNED INTERVENTIONS: [id number]- PT Re-evaluation, 97110-Therapeutic exercises, 97530- Therapeutic activity, 97112- Neuromuscular re-education, 97535- Self  Care, [id number]- Manual therapy, [id number]- Gait training, [phone]- Orthotic Fit/training, [phone]- Canalith repositioning, [id number]- Aquatic Therapy, 97760- Splinting, 97597- Wound care (first 20 sq cm), 97598- Wound care (each additional 20 sq cm)Patient/Family education, Balance training, Stair training, Taping, Dry Needling, Joint mobilization, Joint manipulation, Spinal manipulation, Spinal mobilization, Scar mobilization, and DME instructions. SABRA  PLAN FOR NEXT SESSION:  core and lower extremity strengthening; postural strengthening.  Progress standing exercises.    2:58 PM, 11/24/24 Rosaria Settler, PT, DPT [facility]  "

## 2024-11-28 ENCOUNTER — Ambulatory Visit (HOSPITAL_COMMUNITY)

## 2024-12-01 ENCOUNTER — Ambulatory Visit (HOSPITAL_COMMUNITY)

## 2024-12-01 DIAGNOSIS — M545 Low back pain, unspecified: Secondary | ICD-10-CM | POA: Diagnosis not present

## 2024-12-01 DIAGNOSIS — R29898 Other symptoms and signs involving the musculoskeletal system: Secondary | ICD-10-CM

## 2024-12-01 DIAGNOSIS — R262 Difficulty in walking, not elsewhere classified: Secondary | ICD-10-CM

## 2024-12-01 NOTE — Therapy (Signed)
 " OUTPATIENT PHYSICAL THERAPY THORACOLUMBAR TREATMENT/PROGRESS NOTE Progress Note Reporting Period 10/20/2025 to 12/01/2024  See note below for Objective Data and Assessment of Progress/Goals.   PHYSICAL THERAPY DISCHARGE SUMMARY  Visits from Start of Care: 9  Current functional level related to goals / functional outcomes: See below   Remaining deficits: See below   Education / Equipment: HEP   Patient agrees to discharge. Patient goals were partially met. Patient is being discharged due to being pleased with the current functional level.       Patient Name: Allison Gould MRN: [id number] DOB:July 10, 1951, 74 y.o., female Today's Date: 12/01/2024  END OF SESSION:  PT End of Session - 12/01/24 1458     Visit Number 9    Number of Visits 12    Date for Recertification  12/01/24    Authorization Type [employer] dual complete    Authorization Time Period no auth needed    Progress Note Due on Visit 10    PT Start Time 1458    PT Stop Time 1540    PT Time Calculation (min) 42 min    Activity Tolerance Patient tolerated treatment well    Behavior During Therapy WFL for tasks assessed/performed           Past Medical History:  Diagnosis Date   Anxiety    CKD (chronic kidney disease)    COPD (chronic obstructive pulmonary disease) (HCC)    Diabetes mellitus type II    GERD (gastroesophageal reflux disease)    Gout    Hearing aid worn    right   Hyperparathyroidism    Hypertension    Lipid disorder    Loss of hair    for unknown reason, to see MD    Low back pain    Lupus (systemic lupus erythematosus) (HCC)    Morbid obesity with BMI of 45.0-49.9, adult [facility])    Osteoarthritis    Past Surgical History:  Procedure Laterality Date   CATARACT EXTRACTION W/PHACO Left 05/12/2016   Procedure: CATARACT EXTRACTION PHACO AND INTRAOCULAR LENS PLACEMENT (IOC);  Surgeon: Cherene Mania, MD;  Location: [facility];  Service: Ophthalmology;  Laterality: Left;  CDE: 6.63   CATARACT  EXTRACTION W/PHACO Right 06/16/2016   Procedure: CATARACT EXTRACTION PHACO AND INTRAOCULAR LENS PLACEMENT (IOC); CDE:  4.13;  Surgeon: Cherene Mania, MD;  Location: [facility];  Service: Ophthalmology;  Laterality: Right;   COLONOSCOPY  07/12/2012   Procedure: COLONOSCOPY;  Surgeon: Margo LITTIE Haddock, MD;  Location: [facility];  Service: Endoscopy;  Laterality: N/A;  1:00   COLONOSCOPY N/A 03/21/2024   Procedure: COLONOSCOPY;  Surgeon: Cindie Carlin POUR, DO;  Location: [facility];  Service: Endoscopy;  Laterality: N/A;  1030AM, ASA 3   lipoma-right shoulder     spur and nerve repair right shoulder     Patient Active Problem List   Diagnosis Date Noted   Snoring 03/11/2024   Hyperkalemia 09/17/2022   Hypernatremia 09/17/2022   Localized edema 07/23/2022   Paresthesia of hand 07/23/2022   Congestive heart failure (HCC) 07/12/2022   Chronic obstructive lung disease (HCC) 01/27/2022   Morbid obesity (HCC) 01/27/2022   Edema of lower extremity 09/30/2021   Lupus erythematosus 09/25/2021   Chronic insomnia 08/21/2021   Chronic kidney disease 06/27/2021   Thrombocytopenic disorder 06/27/2021   Gastroesophageal reflux disease 05/24/2021   Hyperlipidemia 05/24/2021   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   Leg weakness 04/14/2013   Back pain 04/14/2013  Knee pain 04/14/2013   Pain in joint, shoulder region 03/11/2013   Muscle weakness (generalized) 03/11/2013   Pain in joint, ankle and foot 03/11/2013   Abnormality of gait 03/11/2013   Right ankle sprain 03/02/2013   Shoulder contusion 03/02/2013   Constipation 06/22/2012   Colon cancer screening 06/22/2012   Pain in limb 07/05/2009   FLANK PAIN, RIGHT 04/04/2009   ELECTROCARDIOGRAM, ABNORMAL 02/20/2009   DIABETES MELLITUS, WITH NEUROLOGICAL COMPLICATIONS 01/09/2009   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) 01/09/2009   Essential hypertension  10/13/2008   Osteoarthritis 10/13/2008   LOW BACK PAIN 10/13/2008    PCP: Joeann Browning, FNP  REFERRING PROVIDER: Joeann Browning, FNP  REFERRING DIAG: M54.50 (ICD-10-CM) - Low back pain, unspecified  Rationale for Evaluation and Treatment: Rehabilitation  THERAPY DIAG:  Low back pain, unspecified back pain laterality, unspecified chronicity, unspecified whether sciatica present  Other symptoms and signs involving the musculoskeletal system  Difficulty in walking, not elsewhere classified  ONSET DATE: worse starting in August of this year  SUBJECTIVE:                                                                                                                                                                                           SUBJECTIVE STATEMENT: Overall 50% better; back pain 6/10; left knee really bothers her a lot now  Eval:  Has low back pain that got worse back in August; went to ED; after kept hurting went back to the MD and told her about it; she referred to physical therapy; [facility]  PERTINENT HISTORY:  Fall with left leg fracture several years ago Anxiety COPD DM HTN CKD Neuropathy? States her feet are numb but unable to say if neuropathy Gout Left knee gives way  PAIN:  Are you having pain? Yes: NPRS scale: 8/10; 10/10 at worst; 4/10 at best Pain location: low back right side Pain description: aching Aggravating factors: laying in certain positions; hard to bend over Relieving factors: muscle cream Aspercreme; Tylenol   PRECAUTIONS: Fall  RED FLAGS: None   WEIGHT BEARING RESTRICTIONS: No  FALLS:  Has patient fallen in last 6 months? No   OCCUPATION: retired  PLOF: Independent with household mobility with device and Independent with community mobility with device  PATIENT GOALS: get better so I can walk better  NEXT MD VISIT: 3rd month  OBJECTIVE:  Note: Objective measures were completed at Evaluation unless  otherwise noted.  DIAGNOSTIC FINDINGS:  CLINICAL DATA:  Low back pain   EXAM: LUMBAR SPINE - 2-3 VIEW   COMPARISON:  11/13/2022   FINDINGS: Diffuse degenerative facet  disease, most pronounced in the lower lumbar spine. 6 mm anterolisthesis of L4 on L5, stable. Disc spaces are maintained. No fracture. SI joints symmetric. Calcified fibroid centrally in the pelvis.   IMPRESSION: Degenerative facet disease with stable slight anterolisthesis at L4-5.   No acute bony abnormality.     Electronically Signed   By: Franky Crease M.D.   On: 06/22/2024 00:19  PATIENT SURVEYS:  Modified Oswestry:  MODIFIED OSWESTRY DISABILITY SCALE  Date: 10/20/2024 Score  Total 29/50; 58%   Interpretation of scores: Score Category Description  0-20% Minimal Disability The patient can cope with most living activities. Usually no treatment is indicated apart from advice on lifting, sitting and exercise  21-40% Moderate Disability The patient experiences more pain and difficulty with sitting, lifting and standing. Travel and social life are more difficult and they may be disabled from work. Personal care, sexual activity and sleeping are not grossly affected, and the patient can usually be managed by conservative means  41-60% Severe Disability Pain remains the main problem in this group, but activities of daily living are affected. These patients require a detailed investigation  61-80% Crippled Back pain impinges on all aspects of the patients life. Positive intervention is required  81-100% Bed-bound These patients are either bed-bound or exaggerating their symptoms  Bluford FORBES Zoe DELENA Karon DELENA, et al. Surgery versus conservative management of stable thoracolumbar fracture: the PRESTO feasibility RCT. [place] ([place]): [employer]; 2021 Nov. [employer] Assessment, No. 25.62.) Appendix 3, Oswestry Disability Index category descriptors. Available from:  [email or web address].cz  Minimally Clinically Important Difference (MCID) = 12.8%  COGNITION: Overall cognitive status: Within functional limits for tasks assessed     SENSATION: WFL;  legs are swollen lower portion  MUSCLE LENGTH: Hamstrings: next visit  POSTURE: increased lumbar lordosis and flexed trunk   PALPATION: Tender right side low back  LUMBAR ROM:   AROM eval  Flexion Slow; guarded down to fingertips 5 above ankle  Extension To neutral only  Right lateral flexion   Left lateral flexion   Right rotation   Left rotation    (Blank rows = not tested)  LOWER EXTREMITY ROM:     Active  Right eval Left eval  Hip flexion    Hip extension    Hip abduction    Hip adduction    Hip internal rotation    Hip external rotation    Knee flexion    Knee extension    Ankle dorsiflexion    Ankle plantarflexion    Ankle inversion    Ankle eversion     (Blank rows = not tested)  LOWER EXTREMITY MMT:    MMT Right eval Left eval Right 12/01/24 Left 12/01/24  Hip flexion 4 4 4+ 5  Hip extension      Hip abduction      Hip adduction      Hip internal rotation      Hip external rotation      Knee flexion      Knee extension 4+ 4+ 5 5  Ankle dorsiflexion 4+ 4+ (sore big toe from dropping phone on her toe) 5 5  Ankle plantarflexion      Ankle inversion      Ankle eversion       (Blank rows = not tested)    FUNCTIONAL TESTS:  5 times sit to stand: 31.39 sec using hands to push up to standing 2 minute walk test: 94 ft  GAIT: Distance walked: 50 ft  in clinic Assistive device utilized: Single point cane Level of assistance: Modified independence Comments: slow antalgic gait  TREATMENT DATE:  12/01/2024 Progress note 5 times sit to stand 22.47 sec using hands to push up 2 MWT 154 ft with SPC Modified Oswestry 21/50 42% MMT's see above Nustep seat 10 x 5' level 3 Goal review  11/24/24: NuStep, level 1, seat 10, 5' Forward foot  taps, 8 inch step, 10x2 each Lateral step overs, 4 inch step, 10x over and back Lateral foot taps, 4 inch step, 10x each LE Heel raises, 2x12 Standing shoulder rows, RTB, 2x10, verbal cues for form Shoulder extension, RTB, 2x10, verbal cues for form    11/22/24 Nustep seat 8 x 5' dynamic warm up level 2 Seated (4 box under feet) Hip adduction with ball 5 hold 2 x 10 LAQ's 2# weight 2 x 10 Hip flexion 2# weight 2 x 10 Standing: Heel raises 2 x 10 Hip extension 2 x 10 each 7 step toe taps    11/18/24 Nustep seat 8 x 5' dynamic warm up Seated (4 box under feet) Heel/toe raises x 20 each Hip adduction 5 hold 2 x 10 Hip abduction with red theraband 2 x 10 Hip flexion and external rotation to tap yoga block x 5 each Standing: (with bilateral upper extremity assist) Heel raises 2 x 10 Hip extension 2 x 10 Hip abduction 2 x 10 4 box step ups x 10 each    PATIENT EDUCATION:  Education details: Patient educated on exam findings, POC, scope of PT, HEP, and what to expect next visit. Person educated: Patient Education method: Explanation, Demonstration, and Handouts Education comprehension: verbalized understanding, returned demonstration, verbal cues required, and tactile cues required   HOME EXERCISE PROGRAM: Access Code: [email or web address] URL: [email or web address] Date: 10/20/2024 Prepared by: [facility] - [facility]  Exercises - Sit to Stand with Armchair  - 2 x daily - 7 x weekly - 1 sets - 5 reps - Seated Transversus Abdominis Bracing  - 2 x daily - 7 x weekly - 1 sets - 10 reps - 5 sec hold  11/01/24: -Decompression exercises ETI handout for thigh high compression garments  ASSESSMENT:  CLINICAL IMPRESSION: Progress note today; patient has met 2/2 short term goals and 2/4 long term goals.  She is pleased with her progress and is agreeable to discharge at this time and plans to join the [facility] to continue with an exercise program.     Eval:  Patient is a 74 y.o.  female who was seen today for physical therapy evaluation and treatment for M54.50 (ICD-10-CM) - Low back pain, unspecified.  Patient demonstrates muscle weakness, reduced ROM, and fascial restrictions which are likely contributing to symptoms of pain and are negatively impacting patient ability to perform ADLs and functional mobility tasks. Patient will benefit from skilled physical therapy services to address these deficits to reduce pain and improve level of function with ADLs and functional mobility tasks.   OBJECTIVE IMPAIRMENTS: Abnormal gait, decreased activity tolerance, decreased ROM, decreased strength, and pain.   ACTIVITY LIMITATIONS: carrying, lifting, bending, sitting, standing, squatting, sleeping, stairs, bathing, and locomotion level  PARTICIPATION LIMITATIONS: meal prep, cleaning, laundry, driving, shopping, community activity, and church  PERSONAL FACTORS: 3+ comorbidities: DM, HTN, CKD are also affecting patient's functional outcome.   REHAB POTENTIAL: Good  CLINICAL DECISION MAKING: Evolving/moderate complexity  EVALUATION COMPLEXITY: Moderate   GOALS: Goals reviewed with patient? No  SHORT TERM GOALS: Target date: 11/11/2023  patient will be independent with initial HEP  and compliant with HEP 3-4 times a week   Baseline: Goal status: met  2.  Patient will report 50% improvement overall   Baseline:  Goal status: met    LONG TERM GOALS: Target date: 12/01/2024  Patient will be independent in self management strategies to improve quality of life and functional outcomes.  Baseline:  Goal status: met  2.  Patient will report 70% improvement overall   Baseline:  Goal status: in progress  3.  Patient will improve 5 times sit to stand score to 20 sec or less to demonstrate improved functional mobility and increased leg strength.    Baseline: 31.39 sec; 22.47 sec 12/01/24 Goal status: in progress  4.  Patient will increase distance on 2 MWT to 150 ft  or more with LRAD to demonstrate improved speed and efficiency with household and community ambulation  Baseline: 94 ft with SPC; 154 ft with [facility] 12/01/24 Goal status: met  PLAN:  PT FREQUENCY: 2x/week  PT DURATION: 6 weeks  PLANNED INTERVENTIONS: 97164- PT Re-evaluation, 97110-Therapeutic exercises, 97530- Therapeutic activity, 97112- Neuromuscular re-education, 97535- Self Care, [id number]- Manual therapy, [id number]- Gait training, [phone]- Orthotic Fit/training, [phone]- Canalith repositioning, [id number]- Aquatic Therapy, 97760- Splinting, 97597- Wound care (first 20 sq cm), 97598- Wound care (each additional 20 sq cm)Patient/Family education, Balance training, Stair training, Taping, Dry Needling, Joint mobilization, Joint manipulation, Spinal manipulation, Spinal mobilization, Scar mobilization, and DME instructions. SABRA  PLAN FOR NEXT SESSION: discharge  3:29 PM, 12/01/24 Demetric Dunnaway Small Alayssa Flinchum MPT [facility] [place] [phone] Ph:[phone]  "

## 2024-12-05 ENCOUNTER — Ambulatory Visit (HOSPITAL_COMMUNITY)

## 2024-12-08 ENCOUNTER — Ambulatory Visit (HOSPITAL_COMMUNITY)

## 2024-12-12 ENCOUNTER — Ambulatory Visit (HOSPITAL_COMMUNITY)
